# Patient Record
Sex: Female | Born: 1960
Health system: Southern US, Community
[De-identification: ages and names within clinical notes are randomized; demographics above are authoritative.]

## PROBLEM LIST (undated history)

## (undated) DIAGNOSIS — R7303 Prediabetes: Secondary | ICD-10-CM

## (undated) DIAGNOSIS — C189 Malignant neoplasm of colon, unspecified: Secondary | ICD-10-CM

## (undated) HISTORY — PX: TUBAL LIGATION: SHX77

## (undated) HISTORY — DX: Prediabetes: R73.03

## (undated) HISTORY — DX: Malignant neoplasm of colon, unspecified: C18.9

---

## 2012-11-01 DIAGNOSIS — R079 Chest pain, unspecified: Secondary | ICD-10-CM

## 2013-12-02 ENCOUNTER — Emergency Department (HOSPITAL_COMMUNITY): Payer: Self-pay

## 2013-12-02 ENCOUNTER — Emergency Department (HOSPITAL_COMMUNITY)
Admission: EM | Admit: 2013-12-02 | Discharge: 2013-12-02 | Disposition: A | Discharge status: Home / Self Care | Payer: Self-pay | Attending: Emergency Medicine | Admitting: Emergency Medicine

## 2013-12-02 ENCOUNTER — Encounter (HOSPITAL_COMMUNITY): Payer: Self-pay | Admitting: Emergency Medicine

## 2013-12-02 DIAGNOSIS — R21 Rash and other nonspecific skin eruption: Secondary | ICD-10-CM | POA: Insufficient documentation

## 2013-12-02 DIAGNOSIS — L03011 Cellulitis of right finger: Secondary | ICD-10-CM

## 2013-12-02 DIAGNOSIS — L03019 Cellulitis of unspecified finger: Principal | ICD-10-CM

## 2013-12-02 DIAGNOSIS — L02519 Cutaneous abscess of unspecified hand: Secondary | ICD-10-CM | POA: Insufficient documentation

## 2013-12-02 MED ORDER — HYDROCODONE-ACETAMINOPHEN 5-325 MG PO TABS: ORAL_TABLET | ORAL | Status: DC

## 2013-12-02 MED ORDER — IBUPROFEN 600 MG PO TABS: 600.0000 mg | ORAL_TABLET | Freq: Four times a day (QID) | ORAL | Status: DC | PRN

## 2013-12-02 MED ORDER — CEPHALEXIN 500 MG PO CAPS: 500.0000 mg | ORAL_CAPSULE | Freq: Four times a day (QID) | ORAL | Status: DC

## 2013-12-02 MED ORDER — OXYCODONE-ACETAMINOPHEN 5-325 MG PO TABS
1.0000 | ORAL_TABLET | Freq: Once | ORAL | Status: AC
  Administered 2013-12-02: 1 via ORAL
  Filled 2013-12-02: qty 1

## 2013-12-02 NOTE — ED Provider Notes (Signed)
CSN: [id number]     Arrival date & time 12/02/13  1321 History   First MD Initiated Contact with Patient 12/02/13 1401    This chart was scribed for Junius FinnerErin O'Malley PA-C, a non-physician practitioner working with No att. providers found by Lewanda RifeAlexandra Hurtado, ED Scribe. This patient was seen in room TR07C/TR07C and the patient's care was started at 4:29 PM      Chief Complaint  Patient presents with  . Finger Injury     (Consider location/radiation/quality/duration/timing/severity/associated sxs/prior Treatment) The history is provided by the patient. No language interpreter was used.   HPI Comments: Leah Hernandez is a 53 y.o. female who presents to the Emergency Department complaining of constant right thumb pain onset 4 days after attempting to remove her own acrylic nails. Reports she stabbed herself under right thumb nail while trying to remove nail. Describes pain as gradually worsening in severity and aching. Reports pain is exacerbated by touch. Reports "soaking" thumb with no relief of symptoms, but denies trying other alleviating factors. Denies associated fever, drainage. Denies hx of DM.     History reviewed. No pertinent past medical history. History reviewed. No pertinent past surgical history. No family history on file. History  Substance Use Topics  . Smoking status: Never Smoker   . Smokeless tobacco: Not on file  . Alcohol Use: No   OB History   Grav Para Term Preterm Abortions TAB SAB Ect Mult Living                 Review of Systems  Constitutional: Negative for fever and chills.  Gastrointestinal: Negative for nausea and vomiting.  Musculoskeletal: Positive for arthralgias, joint swelling and myalgias.       Right thumb  Skin: Positive for rash and wound.  All other systems reviewed and are negative.     Allergies  Review of patient's allergies indicates no known allergies.  Home Medications   Prior to Admission medications   Not on File   BP  127/91  Pulse 78  Temp(Src) 98.4 F (36.9 C) (Oral)  Resp 16  Wt 225 lb (102.059 kg)  SpO2 100% Physical Exam  Nursing note and vitals reviewed. Constitutional: She is oriented to person, place, and time. She appears well-developed and well-nourished.  HENT:  Head: Normocephalic and atraumatic.  Eyes: EOM are normal.  Neck: Normal range of motion.  Cardiovascular: Normal rate.   Pulmonary/Chest: Effort normal.  Musculoskeletal: She exhibits edema and tenderness.  Mild edema to right thumb with erythema. Mild circumferential tenderness.  Decreased flexion and extension due to pain, but increased flexion at PIP of right thumb with passive movement.  Cap refill <2seconds.   Neurological: She is alert and oriented to person, place, and time.  Sensation in tact to light and sharp touch of right thumb  Skin: Skin is warm and dry. There is erythema.  Psychiatric: She has a normal mood and affect. Her behavior is normal.    ED Course  Procedures (including critical care time) COORDINATION OF CARE:  Nursing notes reviewed. Vital signs reviewed. Initial pt interview and examination performed.   Filed Vitals:   12/02/13 1339 12/02/13 1619  BP: 145/93 127/91  Pulse: 89 78  Temp: 98.4 F (36.9 C)   TempSrc: Oral   Resp: 18 16  Weight: 225 lb (102.059 kg)   SpO2: 100% 100%    4:29 PM-Discussed work up plan with pt at bedside, which includes  Orders Placed This Encounter  Procedures  .  DG Finger Thumb Right    Standing Status: Standing     Number of Occurrences: 1     Standing Expiration Date:     Order Specific Question:  Reason for exam:    Answer:  FINGER INJURY  . Pt agrees with plan.   Treatment plan initiated: Medications  oxyCODONE-acetaminophen (PERCOCET/ROXICET) 5-325 MG per tablet 1 tablet (1 tablet Oral Given 12/02/13 1403)     Initial diagnostic testing ordered.       Labs Review Labs Reviewed - No data to display  Imaging Review Dg Finger Thumb  Right  12/02/2013   CLINICAL DATA:  Right thumb swelling, redness.  Possible infection.  EXAM: RIGHT THUMB 2+V  COMPARISON:  None.  FINDINGS: Small amount of gas within the soft tissues at the tip of the right thumb. No underlying bony abnormality. No fracture, subluxation or dislocation. No radiographic changes of osteomyelitis.  IMPRESSION: Small amount of gas within the tip of the right thumb. No underlying bony abnormality.   Electronically Signed   By: Charlett NoseKevin  Dover M.D.   On: 12/02/2013 16:04     EKG Interpretation None      MDM   Final diagnoses:  Cellulitis of right thumb    Patient complaining pain spine and redness of right thumb. Discussed patient with Dr. Fayrene FearingJames who also examined the patient. Will treat for cellulitis with Keflex. Return precautions provided. Pt verbalized understanding and agreement with tx plan.   I personally performed the services described in this documentation, which was scribed in my presence. The recorded information has been reviewed and is accurate.    Junius Finnerrin O'Malley, PA-C 12/02/13 1630

## 2013-12-02 NOTE — Discharge Instructions (Signed)
Cellulitis °Cellulitis is an infection of the skin and the tissue under the skin. The infected area is usually red and tender. This happens most often in the arms and lower legs. °HOME CARE  °· Take your antibiotic medicine as told. Finish the medicine even if you start to feel better. °· Keep the infected arm or leg raised (elevated). °· Put a warm cloth on the area up to 4 times per day. °· Only take medicines as told by your doctor. °· Keep all doctor visits as told. °GET HELP RIGHT AWAY IF:  °· You have a fever. °· You feel very sleepy. °· You throw up (vomit) or have watery poop (diarrhea). °· You feel sick and have muscle aches and pains. °· You see red streaks on the skin coming from the infected area. °· Your red area gets bigger or turns a dark color. °· Your bone or joint under the infected area is painful after the skin heals. °· Your infection comes back in the same area or different area. °· You have a puffy (swollen) bump in the infected area. °· You have new symptoms. °MAKE SURE YOU:  °· Understand these instructions. °· Will watch your condition. °· Will get help right away if you are not doing well or get worse. °Document Released: 01/03/2008 Document Revised: 01/16/2012 Document Reviewed: 10/02/2011 °[employer]® Patient Information ©2014 [employer]. ° °

## 2013-12-02 NOTE — ED Notes (Signed)
Pt returned from X-ray.  

## 2013-12-02 NOTE — ED Notes (Signed)
PA at bedside.

## 2013-12-02 NOTE — ED Notes (Signed)
Pt reports hang nail or cuticle got lodged under right thumb fingernail. Pain to nail bed, bruising noted to nail bed.

## 2013-12-09 NOTE — ED Provider Notes (Signed)
Medical screening examination/treatment/procedure(s) were performed by non-physician practitioner and as supervising physician I was immediately available for consultation/collaboration.   EKG Interpretation None        Dyani Babel, MD 12/09/13 1948 

## 2014-09-07 ENCOUNTER — Encounter (HOSPITAL_COMMUNITY): Payer: Self-pay

## 2014-09-07 ENCOUNTER — Inpatient Hospital Stay (HOSPITAL_COMMUNITY)
Admission: AD | Admit: 2014-09-07 | Discharge: 2014-09-07 | Disposition: A | Discharge status: Home / Self Care | Payer: Self-pay | Source: Ambulatory Visit | Attending: Obstetrics & Gynecology | Admitting: Obstetrics & Gynecology

## 2014-09-07 DIAGNOSIS — R109 Unspecified abdominal pain: Secondary | ICD-10-CM

## 2014-09-07 DIAGNOSIS — K5900 Constipation, unspecified: Secondary | ICD-10-CM

## 2014-09-07 LAB — CBC
HEMATOCRIT: 35.9 % — AB (ref 36.0–46.0)
HEMOGLOBIN: 12 g/dL (ref 12.0–15.0)
MCH: 31 pg (ref 26.0–34.0)
MCHC: 33.4 g/dL (ref 30.0–36.0)
MCV: 92.8 fL (ref 78.0–100.0)
PLATELETS: 181 10*3/uL (ref 150–400)
RBC: 3.87 MIL/uL (ref 3.87–5.11)
RDW: 12.9 % (ref 11.5–15.5)
WBC: 5.2 10*3/uL (ref 4.0–10.5)

## 2014-09-07 LAB — URINALYSIS, ROUTINE W REFLEX MICROSCOPIC
Bilirubin Urine: NEGATIVE
Glucose, UA: NEGATIVE mg/dL
Hgb urine dipstick: NEGATIVE
Ketones, ur: NEGATIVE mg/dL
NITRITE: NEGATIVE
Protein, ur: NEGATIVE mg/dL
SPECIFIC GRAVITY, URINE: 1.025 (ref 1.005–1.030)
Urobilinogen, UA: 0.2 mg/dL (ref 0.0–1.0)
pH: 5.5 (ref 5.0–8.0)

## 2014-09-07 LAB — URINE MICROSCOPIC-ADD ON

## 2014-09-07 LAB — WET PREP, GENITAL
CLUE CELLS WET PREP: NONE SEEN
TRICH WET PREP: NONE SEEN
YEAST WET PREP: NONE SEEN

## 2014-09-07 MED ORDER — KETOROLAC TROMETHAMINE 60 MG/2ML IM SOLN: 60.0000 mg | Freq: Once | INTRAMUSCULAR | Status: DC

## 2014-09-07 MED ORDER — POLYETHYLENE GLYCOL 3350 17 G PO PACK: 17.0000 g | PACK | Freq: Every day | ORAL | Status: DC

## 2014-09-07 NOTE — MAU Note (Signed)
RLQ pain for the past month, pt states she feels as if her abdomen is swollen.  Denies vag bleeding, small amount of discharge.

## 2014-09-07 NOTE — MAU Provider Note (Signed)
History     CSN: [id number]  Arrival date and time: 09/07/14 1549   First Provider Initiated Contact with Patient 09/07/14 1721      Chief Complaint  Patient presents with  . Abdominal Pain   HPI  OB History    No data available      Ms. Leah Hernandez is a 54 y.o. female No obstetric history on file. Who presents with abdominal pain that started 1 month ago.  She is postmenopausal; her last menstrual cycle was when she was in her 26's.   The pain is described as pressure. And the pressure is in her lower abdomen.  She has never had this pain before. She has not tried anything over the counter for the pain.  She has a PCP and she did not notify them of the pain.   Patient is married.  Denies history of uterine fibroids.   She suffers with constipation and does not taken any regular for this.     History reviewed. No pertinent past medical history.  Past Surgical History  Procedure Laterality Date  . Tubal ligation      History reviewed. No pertinent family history.  History  Substance Use Topics  . Smoking status: Never Smoker   . Smokeless tobacco: Not on file  . Alcohol Use: No    Allergies: No Known Allergies  Prescriptions prior to admission  Medication Sig Dispense Refill Last Dose  . cephALEXin (KEFLEX) 500 MG capsule Take 1 capsule (500 mg total) by mouth 4 (four) times daily. (Patient not taking: Reported on 09/07/2014) 40 capsule 0   . HYDROcodone-acetaminophen (NORCO/VICODIN) 5-325 MG per tablet Take 1-2 pills every 4-6 hours as needed for pain. (Patient not taking: Reported on 09/07/2014) 10 tablet 0 more than one month  . ibuprofen (ADVIL,MOTRIN) 600 MG tablet Take 1 tablet (600 mg total) by mouth every 6 (six) hours as needed. (Patient not taking: Reported on 09/07/2014) 30 tablet 0 more than one month   Results for orders placed or performed during the hospital encounter of 09/07/14 (from the past 48 hour(s))  Urinalysis, Routine w reflex  microscopic     Status: Abnormal   Collection Time: 09/07/14  4:15 PM  Result Value Ref Range   Color, Urine YELLOW YELLOW   APPearance CLEAR CLEAR   Specific Gravity, Urine 1.025 1.005 - 1.030   pH 5.5 5.0 - 8.0   Glucose, UA NEGATIVE NEGATIVE mg/dL   Hgb urine dipstick NEGATIVE NEGATIVE   Bilirubin Urine NEGATIVE NEGATIVE   Ketones, ur NEGATIVE NEGATIVE mg/dL   Protein, ur NEGATIVE NEGATIVE mg/dL   Urobilinogen, UA 0.2 0.0 - 1.0 mg/dL   Nitrite NEGATIVE NEGATIVE   Leukocytes, UA SMALL (A) NEGATIVE  Urine microscopic-add on     Status: Abnormal   Collection Time: 09/07/14  4:15 PM  Result Value Ref Range   Squamous Epithelial / LPF FEW (A) RARE   WBC, UA 3-6 <3 WBC/hpf   RBC / HPF 0-2 <3 RBC/hpf   Bacteria, UA FEW (A) RARE  CBC     Status: Abnormal   Collection Time: 09/07/14  5:40 PM  Result Value Ref Range   WBC 5.2 4.0 - 10.5 K/uL   RBC 3.87 3.87 - 5.11 MIL/uL   Hemoglobin 12.0 12.0 - 15.0 g/dL   HCT 40.9 (L) 81.1 - 91.4 %   MCV 92.8 78.0 - 100.0 fL   MCH 31.0 26.0 - 34.0 pg   MCHC 33.4 30.0 - 36.0 g/dL  RDW 12.9 11.5 - 15.5 %   Platelets 181 150 - 400 K/uL  Wet prep, genital     Status: Abnormal   Collection Time: 09/07/14  6:01 PM  Result Value Ref Range   Yeast Wet Prep HPF POC NONE SEEN NONE SEEN   Trich, Wet Prep NONE SEEN NONE SEEN   Clue Cells Wet Prep HPF POC NONE SEEN NONE SEEN   WBC, Wet Prep HPF POC MODERATE (A) NONE SEEN    Comment: MANY BACTERIA SEEN    Review of Systems  Constitutional: Negative for fever and chills.  Eyes: Negative for blurred vision.  Cardiovascular: Negative for chest pain.  Gastrointestinal: Positive for nausea, abdominal pain and constipation (Last BM was this morning. The stools are hard. ). Negative for heartburn, vomiting and diarrhea.  Genitourinary: Negative for dysuria, urgency, frequency and hematuria.       + clear, vaginal discharge.    Physical Exam   Blood pressure 151/83, pulse 97, temperature 98.2 F (36.8  C), temperature source Oral, resp. rate 18, height 5\' 7"  (1.702 m), weight 99.428 kg (219 lb 3.2 oz).  Physical Exam  Constitutional: She is oriented to person, place, and time. She appears well-developed and well-nourished. No distress.  HENT:  Head: Normocephalic.  Neck: Neck supple.  Cardiovascular: Normal rate and normal heart sounds.   Respiratory: Effort normal.  GI: Soft. She exhibits no distension. There is no tenderness. There is no rebound and no guarding.  Genitourinary:  Speculum exam: Vagina - Small amount of creamy discharge, no odor Cervix - No contact bleeding Bimanual exam: Cervix closed Large amount of stool palpated in the colon.  Uterus non tender, normal size Adnexa non tender, no masses bilaterally GC/Chlam, wet prep done Chaperone present for exam.   Neurological: She is alert and oriented to person, place, and time.  Skin: Skin is warm. She is not diaphoretic.  Psychiatric: Her behavior is normal.    MAU Course  Procedures  None  MDM CBC UA Wet prep GC HIV   Assessment and Plan   A: Abdominal pain Constipation   P: Discharge home in stable condition RX: Miralax  Over the counter stool softer as directed on the bottle  Increase water intake. If symptoms have not improved after regular BM, call PCP.   Iona HansenJennifer Irene Rasch, NP 09/07/2014 7:56 PM

## 2014-09-08 LAB — GC/CHLAMYDIA PROBE AMP (~~LOC~~) NOT AT ARMC
Chlamydia: NEGATIVE
Neisseria Gonorrhea: NEGATIVE

## 2014-09-08 LAB — HIV ANTIBODY (ROUTINE TESTING W REFLEX): HIV Screen 4th Generation wRfx: NONREACTIVE

## 2015-02-27 ENCOUNTER — Emergency Department (HOSPITAL_COMMUNITY): Payer: Self-pay

## 2015-02-27 ENCOUNTER — Emergency Department (HOSPITAL_COMMUNITY)
Admission: EM | Admit: 2015-02-27 | Discharge: 2015-02-27 | Disposition: A | Discharge status: Home / Self Care | Payer: Self-pay | Attending: Emergency Medicine | Admitting: Emergency Medicine

## 2015-02-27 ENCOUNTER — Encounter (HOSPITAL_COMMUNITY): Payer: Self-pay | Admitting: Emergency Medicine

## 2015-02-27 DIAGNOSIS — Y9289 Other specified places as the place of occurrence of the external cause: Secondary | ICD-10-CM | POA: Insufficient documentation

## 2015-02-27 DIAGNOSIS — Z79899 Other long term (current) drug therapy: Secondary | ICD-10-CM | POA: Insufficient documentation

## 2015-02-27 DIAGNOSIS — W1839XA Other fall on same level, initial encounter: Secondary | ICD-10-CM | POA: Insufficient documentation

## 2015-02-27 DIAGNOSIS — M25512 Pain in left shoulder: Secondary | ICD-10-CM

## 2015-02-27 DIAGNOSIS — Y9389 Activity, other specified: Secondary | ICD-10-CM | POA: Insufficient documentation

## 2015-02-27 DIAGNOSIS — Y990 Civilian activity done for income or pay: Secondary | ICD-10-CM | POA: Insufficient documentation

## 2015-02-27 DIAGNOSIS — S4992XA Unspecified injury of left shoulder and upper arm, initial encounter: Secondary | ICD-10-CM | POA: Insufficient documentation

## 2015-02-27 MED ORDER — IBUPROFEN 400 MG PO TABS: 400.0000 mg | ORAL_TABLET | Freq: Four times a day (QID) | ORAL | Status: DC | PRN

## 2015-02-27 NOTE — ED Provider Notes (Signed)
History  This chart was scribed for non-physician practitioner, Eyvonne Mechanic, PA-C,working with Elwin Mocha, MD, by Karle Plumber, ED Scribe. This patient was seen in room TR06C/TR06C and the patient's care was started at 10:40 AM.  Chief Complaint  Patient presents with  . Arm Injury   The history is provided by the patient and medical records. No language interpreter was used.    HPI Comments:  Leah Hernandez is a 54 y.o. 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 She states the pain is severe and radiates from her left shoulder down the arm. She has been taking an OTC pain medication in which she cannot remember the name of but thought it was Percocet. Raising the arm makes the pain worse. She denies alleviating factors. She denies numbness, tingling or weakness of the LUE, bruising, wounds, swelling, fever, chills, nausea or vomiting.   History reviewed. No pertinent past medical history. Past Surgical History  Procedure Laterality Date  . Tubal ligation     No family history on file. History  Substance Use Topics  . Smoking status: Never Smoker   . Smokeless tobacco: Not on file  . Alcohol Use: No   OB History    No data available     Review of Systems  All other systems reviewed and are negative.   Allergies  Review of patient's allergies indicates no known allergies.  Home Medications   Prior to Admission medications   Medication Sig Start Date End Date Taking? Authorizing Provider  ibuprofen (ADVIL,MOTRIN) 400 MG tablet Take 1 tablet (400 mg total) by mouth every 6 (six) hours as needed. 02/27/15   Eyvonne Mechanic, PA-C  polyethylene glycol (MIRALAX / GLYCOLAX) packet Take 17 g by mouth daily. 09/07/14   Duane Lope, NP   Triage Vitals: BP 142/88 mmHg  Pulse 87  Temp(Src) 98 F (36.7 C) (Oral)  Resp 17  Ht   (1.702 m)  Wt 226 lb 2 oz (102.57 kg)  BMI 35.41 kg/m2  SpO2 100% Physical Exam  Constitutional: She is oriented to person, place, and time. She appears well-developed and well-nourished.  HENT:  Head: Normocephalic and atraumatic.  Eyes: EOM are normal.  Neck: Normal range of motion.  Cardiovascular: Normal rate.   Pulmonary/Chest: Effort normal.  Musculoskeletal: Normal range of motion.  Shoulders equal bilaterally. No signs of trauma or deformity. Tenderness to palpation to left shoulder diffusely. Reduced left shoulder flexion to 90 degrees due to pain. Remainder of shoulder ROM intact. Full ROM. Pain with active and passive forward flexion. Minimal pain with abduction. Negative lift-off. Pain with empty can. Sensation intact in the extremity. Radial pulse 2+. Cap refill less than 3 seconds. Grip strength 5/5.  Neurological: She is alert and oriented to person, place, and time.  Skin: Skin is warm and dry.  Psychiatric: She has a normal mood and affect. Her behavior is normal.  Nursing note and vitals reviewed.   ED Course  Procedures (including critical care time) DIAGNOSTIC STUDIES: Oxygen Saturation is 100% on RA, normal by my interpretation.   COORDINATION OF CARE: 10:43 AM- Will X-Ray left shoulder. Pt verbalizes understanding and agrees to plan.  Medications - No data to display  Labs Review Labs Reviewed - No data to display  Imaging Review Dg Shoulder Left  02/27/2015   CLINICAL DATA:  Fall on left shoulder  1 month ago. Worsening pain over that time period.  EXAM: LEFT SHOULDER - 2+ VIEW  COMPARISON:  None.  FINDINGS: There is no evidence of fracture or dislocation. There is no evidence of arthropathy or other focal bone abnormality. Soft tissues are unremarkable.  IMPRESSION: Negative.   Electronically Signed   By: Charlett Nose M.D.   On: 02/27/2015 11:26     EKG Interpretation None      MDM   Final diagnoses:  Left shoulder pain     Labs:  Imaging: DG  shoulder left no significant findings  Consults:  Therapeutics:  Discharge Meds:   Assessment/Plan: Patient presents with a injury to her left shoulder. This fall was one month ago, and has not improved. No findings on plain radiograph, likely rotator cuff. Patient has full sensation and strength of the distal extremity, reduced range of motion due to pain. She'll be instructed to follow-up with orthopedic specialist for further evaluation and management. Patient verbalizes understanding and agreement for today's plan and had no further questions or concerns at time of discharge. Patient instructed use Tylenol or ibuprofen as needed for the pain.  I personally performed the services described in this documentation, which was scribed in my presence. The recorded information has been reviewed and is accurate.    Eyvonne Mechanic, PA-C 02/27/15 1648  Elwin Mocha, MD 02/28/15 [phone]

## 2015-02-27 NOTE — Discharge Instructions (Signed)
Please follow-up with orthopedic specialist for further evaluation and management. Please use ibuprofen as needed for pain.

## 2015-02-27 NOTE — ED Notes (Signed)
Pt. Stated, i fell about  Month ago and my left arm is still hurting especially at night.

## 2015-06-08 ENCOUNTER — Encounter (HOSPITAL_COMMUNITY): Payer: Self-pay | Admitting: *Deleted

## 2015-06-08 ENCOUNTER — Emergency Department (HOSPITAL_COMMUNITY)
Admission: EM | Admit: 2015-06-08 | Discharge: 2015-06-08 | Disposition: A | Discharge status: Home / Self Care | Payer: Self-pay | Attending: Emergency Medicine | Admitting: Emergency Medicine

## 2015-06-08 DIAGNOSIS — K0889 Other specified disorders of teeth and supporting structures: Secondary | ICD-10-CM | POA: Insufficient documentation

## 2015-06-08 DIAGNOSIS — Z79899 Other long term (current) drug therapy: Secondary | ICD-10-CM | POA: Insufficient documentation

## 2015-06-08 DIAGNOSIS — R220 Localized swelling, mass and lump, head: Secondary | ICD-10-CM | POA: Insufficient documentation

## 2015-06-08 MED ORDER — BUPIVACAINE-EPINEPHRINE (PF) 0.5% -1:200000 IJ SOLN
1.8000 mL | Freq: Once | INTRAMUSCULAR | Status: AC
  Administered 2015-06-08: 1.8 mL

## 2015-06-08 MED ORDER — HYDROCODONE-ACETAMINOPHEN 5-325 MG PO TABS: ORAL_TABLET | ORAL | Status: DC

## 2015-06-08 MED ORDER — AMOXICILLIN 500 MG PO CAPS
500.0000 mg | ORAL_CAPSULE | Freq: Once | ORAL | Status: AC
  Administered 2015-06-08: 500 mg via ORAL
  Filled 2015-06-08: qty 1

## 2015-06-08 MED ORDER — AMOXICILLIN 500 MG PO CAPS: 500.0000 mg | ORAL_CAPSULE | Freq: Three times a day (TID) | ORAL | Status: DC

## 2015-06-08 NOTE — Discharge Instructions (Signed)
Take vicodin for breakthrough pain, do not drink alcohol, drive, care for children or do other critical tasks while taking vicodin. ° °Return to the emergency room for fever, change in vision, redness to the face that rapidly spreads towards the eye, nausea or vomiting, difficulty swallowing or shortness of breath. °  °Apply warm compresses to jaw throughout the day.  ° °Take your antibiotics as directed and to the end of the course. DO NOT drink alcohol when taking metronidazole, it will make you very sick!  ° °Followup with a dentist is very important for ongoing evaluation and management of recurrent dental pain. Return to emergency department for emergent changing or worsening symptoms." ° °Low-cost dental clinic: °**[facility]  [facility]  at [phone]**  ° °You may also call [phone] ° °Dental Assistance °If the dentist on-call cannot see you, please use the resources below: ° ° °Patients with Medicaid: [facility] °[address], [phone] °[address], [phone] ° °If unable to pay, or uninsured, contact [facility] ([phone]) or [facility] ([phone] in [place], [phone] in [place]) to become qualified for the adult dental clinic ° °Other Low-Cost Community Dental Services: °[facility]- [address], [place], [place], [address] °   [phone], Ext. [phone] °   2nd and 4th Thursday of the month at 6:30am °   10 clients each day by appointment, can sometimes see walk-in     patients if someone does not show for an appointment °[facility]- [address], [place], [place], [address] °   [phone] °[facility]- [address], [place], [place], [address] °   [phone] ° °[facility]- [phone] °[facility]- [phone] °[facility]- [phone] ° °

## 2015-06-08 NOTE — ED Notes (Signed)
The pt is c/o lt face pain and swelliong since Saturday.  No known injury lmp none

## 2015-06-08 NOTE — ED Provider Notes (Signed)
CSN: [id number]     Arrival date & time 06/08/15  1725 History  By signing my name below, I, Leah Hernandez, attest that this documentation has been prepared under the direction and in the presence of [employer] Corporation[employer]ole Nykeria Mealing, PA-C. Electronically Signed: Lyndel SafeKaitlyn Hernandez, ED Scribe. 06/08/2015. 5:54 PM.   Chief Complaint  Patient presents with  . Facial Pain   The history is provided by the patient. No language interpreter was used.   HPI Comments: Leah Hernandez is a 54 y.o. female, with no pertinent PMhx, who presents to the Emergency Department complaining of sudden onset, constant, 8/10 pain and swelling to lower, left mandible X 1 day. She associates posterior left lower dental pain. Pt has been taking Advil that has provided temporary relief. She is not followed by a dentist. NKDA   History reviewed. No pertinent past medical history. Past Surgical History  Procedure Laterality Date  . Tubal ligation     No family history on file. Social History  Substance Use Topics  . Smoking status: Never Smoker   . Smokeless tobacco: None  . Alcohol Use: No   OB History    No data available     Review of Systems A complete 10 system review of systems was obtained and is otherwise negative except at noted in the HPI and PMH.  Allergies  Review of patient's allergies indicates no known allergies.  Home Medications   Prior to Admission medications   Medication Sig Start Date End Date Taking? Authorizing Provider  ibuprofen (ADVIL,MOTRIN) 400 MG tablet Take 1 tablet (400 mg total) by mouth every 6 (six) hours as needed. 02/27/15   Eyvonne MechanicJeffrey Hedges, PA-C  polyethylene glycol (MIRALAX / GLYCOLAX) packet Take 17 g by mouth daily. 09/07/14   Harolyn RutherfordJennifer I Rasch, NP   BP 151/89 mmHg  Pulse 97  Temp(Src) 99.3 F (37.4 C) (Oral)  Resp 18  Ht 5\' 8"  (1.727 m)  Wt 226 lb (102.513 kg)  BMI 34.37 kg/m2  SpO2 98% Physical Exam  Constitutional: She is oriented to person, place, and time. She appears  well-developed and well-nourished. No distress.  HENT:  Head: Normocephalic.  Mouth/Throat: Uvula is midline and oropharynx is clear and moist. No trismus in the jaw. No uvula swelling. No oropharyngeal exudate, posterior oropharyngeal edema, posterior oropharyngeal erythema or tonsillar abscesses.  Trace left mandibular edema, no overlying skin changes  Generally poor dentition, no gingival swelling, erythema or tenderness to palpation. Patient is handling their secretions. There is no tenderness to palpation or firmness underneath tongue bilaterally. No trismus.    Eyes: Conjunctivae and EOM are normal.  Neck: Normal range of motion. Neck supple.  Cardiovascular: Normal rate.   Pulmonary/Chest: Effort normal. No stridor. No respiratory distress.  Musculoskeletal: Normal range of motion.  Lymphadenopathy:    She has no cervical adenopathy.  Neurological: She is alert and oriented to person, place, and time. Coordination normal.  Skin: Skin is warm.  Psychiatric: She has a normal mood and affect. Her behavior is normal.  Nursing note and vitals reviewed.   ED Course  .Nerve Block Date/Time: 06/09/2015 12:12 AM Performed by: Wynetta EmeryPISCIOTTA, Disney Ruggiero Authorized by: Wynetta EmeryPISCIOTTA, Kinnley Paulson Consent: Verbal consent obtained. Consent given by: patient Indications: pain relief Body area: face/mouth Nerve: inferior alveolar Laterality: left Patient sedated: no Patient position: sitting Needle gauge: 27 G Location technique: anatomical landmarks Anesthetic total: 1.8 ml Outcome: pain improved Patient tolerance: Patient tolerated the procedure well with no immediate complications    DIAGNOSTIC STUDIES: Oxygen Saturation  is 98% on RA, normal by my interpretation.    COORDINATION OF CARE: 5:53 PM Discussed treatment plan with pt at bedside and pt agreed to plan. Will perform dental block with Marcaine 0.5% w/ epinephrine; pt tolerated procedure well. Amoxicillin ordered and course prescribed. Pain  medication also prescribed. Will give resource list for dental care.   MDM   Final diagnoses:  Pain, dental    Filed Vitals:   06/08/15 1731  BP: 151/89  Pulse: 97  Temp: 99.3 F (37.4 C)  TempSrc: Oral  Resp: 18  Height:  (1.727 m)  Weight: 226 lb (102.513 kg)  SpO2: 98%    Medications  amoxicillin (AMOXIL) capsule 500 mg (500 mg Oral Given 06/08/15 1834)  bupivacaine-epinephrine (MARCAINE W/ EPI) 0.5% -1:200000 injection 1.8 mL (1.8 mLs Infiltration Given 06/08/15 1834)    Leah Hernandez is 55 y.o. female presenting with dental pain and facial swelling, no fluctuant abscess. No signs of deep tissue infection.  Evaluation does not show pathology that would require ongoing emergent intervention or inpatient treatment. Pt is hemodynamically stable and mentating appropriately. Discussed findings and plan with patient/guardian, who agrees with care plan. All questions answered. Return precautions discussed and outpatient follow up given.   Discharge Medication List as of 06/08/2015  5:59 PM    START taking these medications   Details  amoxicillin (AMOXIL) 500 MG capsule Take 1 capsule (500 mg total) by mouth 3 (three) times daily., Starting 06/08/2015, Until Discontinued, Print    HYDROcodone-acetaminophen (NORCO/VICODIN) 5-325 MG tablet Take 1-2 tablets by mouth every 6 hours as needed for pain and/or cough., Print        I personally performed the services described in this documentation, which was scribed in my presence. The recorded information has been reviewed and is accurate.     Wynetta Emery, PA-C 06/09/15 0013  Linwood Dibbles, MD 06/09/15 1054

## 2015-06-22 ENCOUNTER — Encounter (HOSPITAL_COMMUNITY): Payer: Self-pay | Admitting: Emergency Medicine

## 2015-06-22 ENCOUNTER — Emergency Department (HOSPITAL_COMMUNITY)
Admission: EM | Admit: 2015-06-22 | Discharge: 2015-06-22 | Disposition: A | Discharge status: Home / Self Care | Payer: Self-pay | Attending: Emergency Medicine | Admitting: Emergency Medicine

## 2015-06-22 DIAGNOSIS — T360X5A Adverse effect of penicillins, initial encounter: Secondary | ICD-10-CM | POA: Insufficient documentation

## 2015-06-22 DIAGNOSIS — L270 Generalized skin eruption due to drugs and medicaments taken internally: Secondary | ICD-10-CM | POA: Insufficient documentation

## 2015-06-22 DIAGNOSIS — Z79899 Other long term (current) drug therapy: Secondary | ICD-10-CM | POA: Insufficient documentation

## 2015-06-22 DIAGNOSIS — Z792 Long term (current) use of antibiotics: Secondary | ICD-10-CM | POA: Insufficient documentation

## 2015-06-22 MED ORDER — HYDROXYZINE HCL 25 MG PO TABS: 25.0000 mg | ORAL_TABLET | Freq: Four times a day (QID) | ORAL | Status: DC

## 2015-06-22 MED ORDER — LORATADINE 10 MG PO TABS
10.0000 mg | ORAL_TABLET | Freq: Every day | ORAL | Status: DC
Start: 2015-06-22 — End: 2017-03-27

## 2015-06-22 MED ORDER — LORATADINE 10 MG PO TABS
10.0000 mg | ORAL_TABLET | Freq: Once | ORAL | Status: AC
  Administered 2015-06-22: 10 mg via ORAL
  Filled 2015-06-22: qty 1

## 2015-06-22 MED ORDER — HYDROXYZINE HCL 10 MG PO TABS
10.0000 mg | ORAL_TABLET | Freq: Once | ORAL | Status: AC
  Administered 2015-06-22: 10 mg via ORAL
  Filled 2015-06-22 (×2): qty 1

## 2015-06-22 NOTE — Discharge Instructions (Signed)
Drug Rash °A drug rash is a change in the color or texture of the skin that is caused by a drug. It can develop minutes, hours, or days after the person takes the drug. °CAUSES °This condition is usually caused by a drug allergy. It can also be caused by exposure to sunlight after taking a drug that makes the skin sensitive to light. Drugs that commonly cause rashes include: °· Penicillin. °· Antibiotic medicines. °· Medicines that treat seizures. °· Medicines that treat cancer (chemotherapy). °· Aspirin and other nonsteroidal anti-inflammatory drugs (NSAIDs). °· Injectable dyes that contain iodine. °· Insulin. °SYMPTOMS °Symptoms of this condition include: °· Redness. °· Tiny bumps. °· Peeling. °· Itching. °· Itchy welts (hives). °· Swelling. °The rash may appear on a small area of skin or all over the body. °DIAGNOSIS °To diagnose the condition, your health care provider will do a physical exam. He or she may also order tests to find out which drug caused the rash. Tests to find the cause of a rash include: °· Skin tests. °· Blood tests. °· Drug challenge. For this test, you stop taking all of the drugs that you do not need to take, and then you start taking them again by adding back one of the drugs at a time. °TREATMENT °A drug rash may be treated with medicines, including: °· Antihistamines. These may be given to relieve itching. °· An NSAID. This may be given to reduce swelling and treat pain. °· A steroid drug. This may be given to reduce swelling. °The rash usually goes away when the person stops taking the drug that caused it. °HOME CARE INSTRUCTIONS °· Take medicines only as directed by your health care provider. °· Let all of your health care providers know about any drug reactions you have had in the past. °· If you have hives, take a cool shower or use a cool compress to relieve itchiness. °SEEK MEDICAL CARE IF: °· You have a fever. °· Your rash is not going away. °· Your rash gets worse. °· Your rash  comes back. °· You have wheezing or coughing. °SEEK IMMEDIATE MEDICAL CARE IF: °· You start to have breathing problems. °· You start to have shortness of breath. °· You face or throat starts to swell. °· You have severe weakness with dizziness or fainting. °· You have chest pain. °  °This information is not intended to replace advice given to you by your health care provider. Make sure you discuss any questions you have with your health care provider. °  °Document Released: 08/24/2004 Document Revised: 08/07/2014 Document Reviewed: 05/13/2014 °Elsevier Interactive Patient Education ©2016 Elsevier Inc. ° °

## 2015-06-22 NOTE — ED Notes (Addendum)
RASH X 1 WEEK. TOOK AN ANTIBIOTIC 1 WEEK PRIOR TO THAT FOR A DENTAL INFECTION. ITCHING RASH PREDOMINATELY TO CHEST AND BACK. DENIES ANY OTHER SX. PT ALSO REPORTS, SHE HAS ITCHING IN PALMS OF HANDS AND SOLE'S OF FEET.

## 2015-06-22 NOTE — ED Notes (Signed)
Called and spoke with Dennie BiblePat, pharmacy, and she advised that they had sent.   Nothing received on this end.  She stated she would send another.

## 2015-06-22 NOTE — ED Notes (Signed)
Called pharmacy to send Atarax.

## 2015-06-22 NOTE — ED Provider Notes (Signed)
CSN: [id number]     Arrival date & time 06/22/15  1050 History  By signing my name below, I, Ronney Lion, attest that this documentation has been prepared under the direction and in the presence of Marlon Pel, PA-C. Electronically Signed: Ronney Lion, ED Scribe. 06/22/2015. 12:08 PM.     Chief Complaint  Patient presents with  . Rash   The history is provided by the patient. No language interpreter was used.    HPI Comments: Leah Hernandez is a 54 y.o. female who presents to the Emergency Department complaining of a gradual-onset, constant, gradually worsening, pruritic, generalized rash, predominately to her chest and back, that began 2 days ago. She also reports itching in the palms of her hand and soles of her feet. She states she started taking amoxicillin 6 days ago and stopped taking the amoxicillin yesterday. Patient states she had taken amoxicillin in the past, but she has never had any reaction to it in the past.  Has been using hydrocortisone cream for itching control. She notes she has sensitive skin. She denies lip swelling, mouth swelling, or difficulty breathing.  No systemic sypmtoms  History reviewed. No pertinent past medical history. Past Surgical History  Procedure Laterality Date  . Tubal ligation     No family history on file. Social History  Substance Use Topics  . Smoking status: Never Smoker   . Smokeless tobacco: None  . Alcohol Use: No   OB History    No data available     Review of Systems  A complete 10 system review of systems was obtained and all systems are negative except as noted in the HPI and PMH.     Allergies  Review of patient's allergies indicates no known allergies.  Home Medications   Prior to Admission medications   Medication Sig Start Date End Date Taking? Authorizing Provider  amoxicillin (AMOXIL) 500 MG capsule Take 1 capsule (500 mg total) by mouth 3 (three) times daily. 06/08/15   Nicole Pisciotta, PA-C   HYDROcodone-acetaminophen (NORCO/VICODIN) 5-325 MG tablet Take 1-2 tablets by mouth every 6 hours as needed for pain and/or cough. 06/08/15   Nicole Pisciotta, PA-C  hydrOXYzine (ATARAX/VISTARIL) 25 MG tablet Take 1 tablet (25 mg total) by mouth every 6 (six) hours. 06/22/15   Daimian Sudberry Neva Seat, PA-C  ibuprofen (ADVIL,MOTRIN) 400 MG tablet Take 1 tablet (400 mg total) by mouth every 6 (six) hours as needed. 02/27/15   Eyvonne Mechanic, PA-C  loratadine (CLARITIN) 10 MG tablet Take 1 tablet (10 mg total) by mouth daily. 06/22/15   Latashia Koch Neva Seat, PA-C  polyethylene glycol (MIRALAX / GLYCOLAX) packet Take 17 g by mouth daily. 09/07/14   Harolyn Rutherford Rasch, NP   BP 138/88 mmHg  Pulse 85  Temp(Src) 98.1 F (36.7 C) (Oral)  Resp 18  SpO2 98% Physical Exam  Constitutional: She is oriented to person, place, and time. She appears well-developed and well-nourished. No distress.  HENT:  Head: Normocephalic and atraumatic.  Mouth/Throat: Uvula is midline, oropharynx is clear and moist and mucous membranes are normal.  Eyes: Conjunctivae and EOM are normal.  Neck: Neck supple. No tracheal deviation present.  Cardiovascular: Normal rate.   Pulmonary/Chest: Effort normal. No respiratory distress.  Musculoskeletal: Normal range of motion.  Neurological: She is alert and oriented to person, place, and time.  Skin: Skin is warm and dry.  Erythematous macules that are diffuse and symmetric in distribution.   Psychiatric: She has a normal mood and affect. Her behavior is  normal.  Nursing note and vitals reviewed.   ED Course  Procedures (including critical care time)  DIAGNOSTIC STUDIES: Oxygen Saturation is 98% on RA, normal by my interpretation.    COORDINATION OF CARE: 12:04 PM - Discussed treatment plan with pt at bedside which includes Rx hydroxyzine. Pt cautioned about sedating side effects of medication. Strict return precautions given, and pt instructed to return if she develops wheezing, lip  swelling, or any other new, worsening, or concerning symptoms. Pt verbalized understanding and agreed to plan.   MDM   Final diagnoses:  Drug exanthem   NO systemic symptoms. Pt well appearing with no angioedema or respiratory involvement.  Given Atarax and Claritin. To continue using hydrocortisone cream at home for itching. She has already completed her abx.  Medications  hydrOXYzine (ATARAX/VISTARIL) tablet 10 mg (not administered)  loratadine (CLARITIN) tablet 10 mg (not administered)    54 y.o.Leah Hernandez's medical screening exam was performed and I feel the patient has had an appropriate workup for their chief complaint at this time and likelihood of emergent condition existing is low. They have been counseled on decision, discharge, follow up and which symptoms necessitate immediate return to the emergency department. They or their family verbally stated understanding and agreement with plan and discharged in stable condition.   Vital signs are stable at discharge. Filed Vitals:   06/22/15 1058 06/22/15 1210  BP: 146/76 138/88  Pulse: 91 85  Temp: 98 F (36.7 C) 98.1 F (36.7 C)  Resp: 18 18    I personally performed the services described in this documentation, which was scribed in my presence. The recorded information has been reviewed and is accurate.     Marlon Peliffany Chancy Smigiel, PA-C 06/22/15 1216  Melene Planan Floyd, DO 06/22/15 [phone]

## 2015-10-10 ENCOUNTER — Emergency Department (HOSPITAL_COMMUNITY)
Admission: EM | Admit: 2015-10-10 | Discharge: 2015-10-10 | Disposition: A | Discharge status: Home / Self Care | Payer: Self-pay | Attending: Emergency Medicine | Admitting: Emergency Medicine

## 2015-10-10 ENCOUNTER — Encounter (HOSPITAL_COMMUNITY): Payer: Self-pay | Admitting: *Deleted

## 2015-10-10 DIAGNOSIS — Z792 Long term (current) use of antibiotics: Secondary | ICD-10-CM | POA: Insufficient documentation

## 2015-10-10 DIAGNOSIS — R509 Fever, unspecified: Secondary | ICD-10-CM | POA: Insufficient documentation

## 2015-10-10 DIAGNOSIS — K0889 Other specified disorders of teeth and supporting structures: Secondary | ICD-10-CM | POA: Insufficient documentation

## 2015-10-10 DIAGNOSIS — Z79899 Other long term (current) drug therapy: Secondary | ICD-10-CM | POA: Insufficient documentation

## 2015-10-10 MED ORDER — HYDROCODONE-ACETAMINOPHEN 5-325 MG PO TABS
1.0000 | ORAL_TABLET | Freq: Once | ORAL | Status: AC
  Administered 2015-10-10: 1 via ORAL
  Filled 2015-10-10: qty 1

## 2015-10-10 MED ORDER — AMOXICILLIN 500 MG PO CAPS: 500.0000 mg | ORAL_CAPSULE | Freq: Three times a day (TID) | ORAL | Status: DC

## 2015-10-10 NOTE — ED Provider Notes (Signed)
CSN: [id number]     Arrival date & time 10/10/15  1015 History  By signing my name below, I, Soijett Blue, attest that this documentation has been prepared under the direction and in the presence of Jaynie Crumbleatyana Paislei Dorval, [employer]PA-C Electronically Signed: Soijett Blue, ED Scribe. 10/10/2015. 10:52 AM.   Chief Complaint  Patient presents with  . Dental Pain      The history is provided by the patient. No language interpreter was used.    Leah Hernandez is a 55 y.o. female who presents to the Emergency Department complaining of constant left lower dental pain onset yesterday. She notes that she is supposed to have the tooth pulled, but she has been putting it off lately. She notes that her left lower dental pain is worsened with temperature change and eating. Pt notes that she does have a dentist. She states that she is having associated symptoms of subjective fever and left ear pain. She states that she has tried advil with no relief for her symptoms. She denies facial swelling, trouble swallowing, and any other symptoms.    Per pt chart review: Pt was seen in the ED on 06/08/2015 for left lower dental pain. Pt had no imaging or labs completed at the time. Pt was given a dental block while in the ED for treatment. Pt was Rx amoxil and norco for their symptoms and given a dental resource list.  History reviewed. No pertinent past medical history. Past Surgical History  Procedure Laterality Date  . Tubal ligation     History reviewed. No pertinent family history. Social History  Substance Use Topics  . Smoking status: Never Smoker   . Smokeless tobacco: None  . Alcohol Use: No   OB History    No data available     Review of Systems  Constitutional: Positive for fever (subjective). Negative for chills.  HENT: Positive for dental problem and ear pain. Negative for facial swelling and trouble swallowing.       Allergies  Review of patient's allergies indicates no known allergies.  Home  Medications   Prior to Admission medications   Medication Sig Start Date End Date Taking? Authorizing Provider  amoxicillin (AMOXIL) 500 MG capsule Take 1 capsule (500 mg total) by mouth 3 (three) times daily. 06/08/15   Nicole Pisciotta, PA-C  HYDROcodone-acetaminophen (NORCO/VICODIN) 5-325 MG tablet Take 1-2 tablets by mouth every 6 hours as needed for pain and/or cough. 06/08/15   Nicole Pisciotta, PA-C  hydrOXYzine (ATARAX/VISTARIL) 25 MG tablet Take 1 tablet (25 mg total) by mouth every 6 (six) hours. 06/22/15   Tiffany Neva SeatGreene, PA-C  ibuprofen (ADVIL,MOTRIN) 400 MG tablet Take 1 tablet (400 mg total) by mouth every 6 (six) hours as needed. 02/27/15   Eyvonne MechanicJeffrey Hedges, PA-C  loratadine (CLARITIN) 10 MG tablet Take 1 tablet (10 mg total) by mouth daily. 06/22/15   Tiffany Neva SeatGreene, PA-C  polyethylene glycol (MIRALAX / GLYCOLAX) packet Take 17 g by mouth daily. 09/07/14   Harolyn RutherfordJennifer I Rasch, NP   BP 144/89 mmHg  Pulse 83  Temp(Src) 98.3 F (36.8 C) (Oral)  Resp 16  SpO2 100% Physical Exam  Constitutional: She is oriented to person, place, and time. She appears well-developed and well-nourished. No distress.  HENT:  Head: Normocephalic and atraumatic.  Tender to palpation left lower second and third molars. No surrounding gum swelling or evidence of an abscess. No facial swelling. Swelling under the tongue. No trismus.  Eyes: EOM are normal.  Neck: Neck supple.  Cardiovascular:  Normal rate.   Pulmonary/Chest: Effort normal. No respiratory distress.  Musculoskeletal: Normal range of motion.  Lymphadenopathy:    She has no cervical adenopathy.  Neurological: She is alert and oriented to person, place, and time.  Skin: Skin is warm and dry.  Psychiatric: She has a normal mood and affect. Her behavior is normal.  Nursing note and vitals reviewed.   ED Course  Procedures (including critical care time) DIAGNOSTIC STUDIES: Oxygen Saturation is 100% on RA, nl by my interpretation.     COORDINATION OF CARE: 10:52 AM Discussed treatment plan with pt at bedside which includes abx Rx, alternate tylenol and motrin, warm compresses, referral and follow up with dentist, pain medications, and pt agreed to plan.    Labs Review Labs Reviewed - No data to display  Imaging Review No results found.    EKG Interpretation None      MDM   Final diagnoses:  Pain, dental   Patient with ongoing dental pain, has been seen here before for the same. 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 Patient is concerned that her tooth might be getting infected. I will start her on amoxicillin. I will have patient follow-up with a dentist. At this time no evidence of ludwigs angina or any other emergent condition. Return precautions discussed.   Filed Vitals:   10/10/15 1025  BP: 144/89  Pulse: 83  Temp: 98.3 F (36.8 C)  TempSrc: Oral  Resp: 16  SpO2: 100%      Jaynie Crumble, PA-C 10/10/15 1638  Lorre Nick, MD 10/13/15 1421

## 2015-10-10 NOTE — Discharge Instructions (Signed)
Amoxil as prescribed until all gone. Continue aleve for pain. Follow up with your dentist.   Dental Pain Dental pain may be caused by many things, including:  Tooth decay (cavities or caries). Cavities expose the nerve of your tooth to air and hot or cold temperatures. This can cause pain or discomfort.  Abscess or infection. A dental abscess is a collection of infected pus from a bacterial infection in the inner part of the tooth (pulp). It usually occurs at the end of the tooth's root.  Injury.  An unknown reason (idiopathic). Your pain may be mild or severe. It may only occur when:  You are chewing.  You are exposed to hot or cold temperature.  You are eating or drinking sugary foods or beverages, such as soda or candy. Your pain may also be constant. HOME CARE INSTRUCTIONS Watch your dental pain for any changes. The following actions may help to lessen any discomfort that you are feeling:  Take medicines only as directed by your dentist.  If you were prescribed an antibiotic medicine, finish all of it even if you start to feel better.  Keep all follow-up visits as directed by your dentist. This is important.  Do not apply heat to the outside of your face.  Rinse your mouth or gargle with salt water if directed by your dentist. This helps with pain and swelling.  You can make salt water by adding  tsp of salt to 1 cup of warm water.  Apply ice to the painful area of your face:  Put ice in a plastic bag.  Place a towel between your skin and the bag.  Leave the ice on for 20 minutes, 2-3 times per day.  Avoid foods or drinks that cause you pain, such as:  Very hot or very cold foods or drinks.  Sweet or sugary foods or drinks. SEEK MEDICAL CARE IF:  Your pain is not controlled with medicines.  Your symptoms are worse.  You have new symptoms. SEEK IMMEDIATE MEDICAL CARE IF:  You are unable to open your mouth.  You are having trouble breathing or  swallowing.  You have a fever.  Your face, neck, or jaw is swollen.   This information is not intended to replace advice given to you by your health care provider. Make sure you discuss any questions you have with your health care provider.   Document Released: 07/17/2005 Document Revised: 12/01/2014 Document Reviewed: 07/13/2014 Elsevier Interactive Patient Education [employer]2016 Elsevier Inc.

## 2015-10-10 NOTE — ED Notes (Signed)
Declined W/C at D/C and was escorted to lobby by RN. 

## 2015-10-10 NOTE — ED Notes (Signed)
PT reports dental pain started on SAT.

## 2016-10-23 IMAGING — DX DG SHOULDER 2+V*L*
4 series · 4 of 4 positions shown · non-contrast
Comparison: None.

CLINICAL DATA: Fall on left shoulder 1 month ago. Worsening pain
over that time period.

EXAM:
LEFT SHOULDER - 2+ VIEW

[w shoulder internal left]
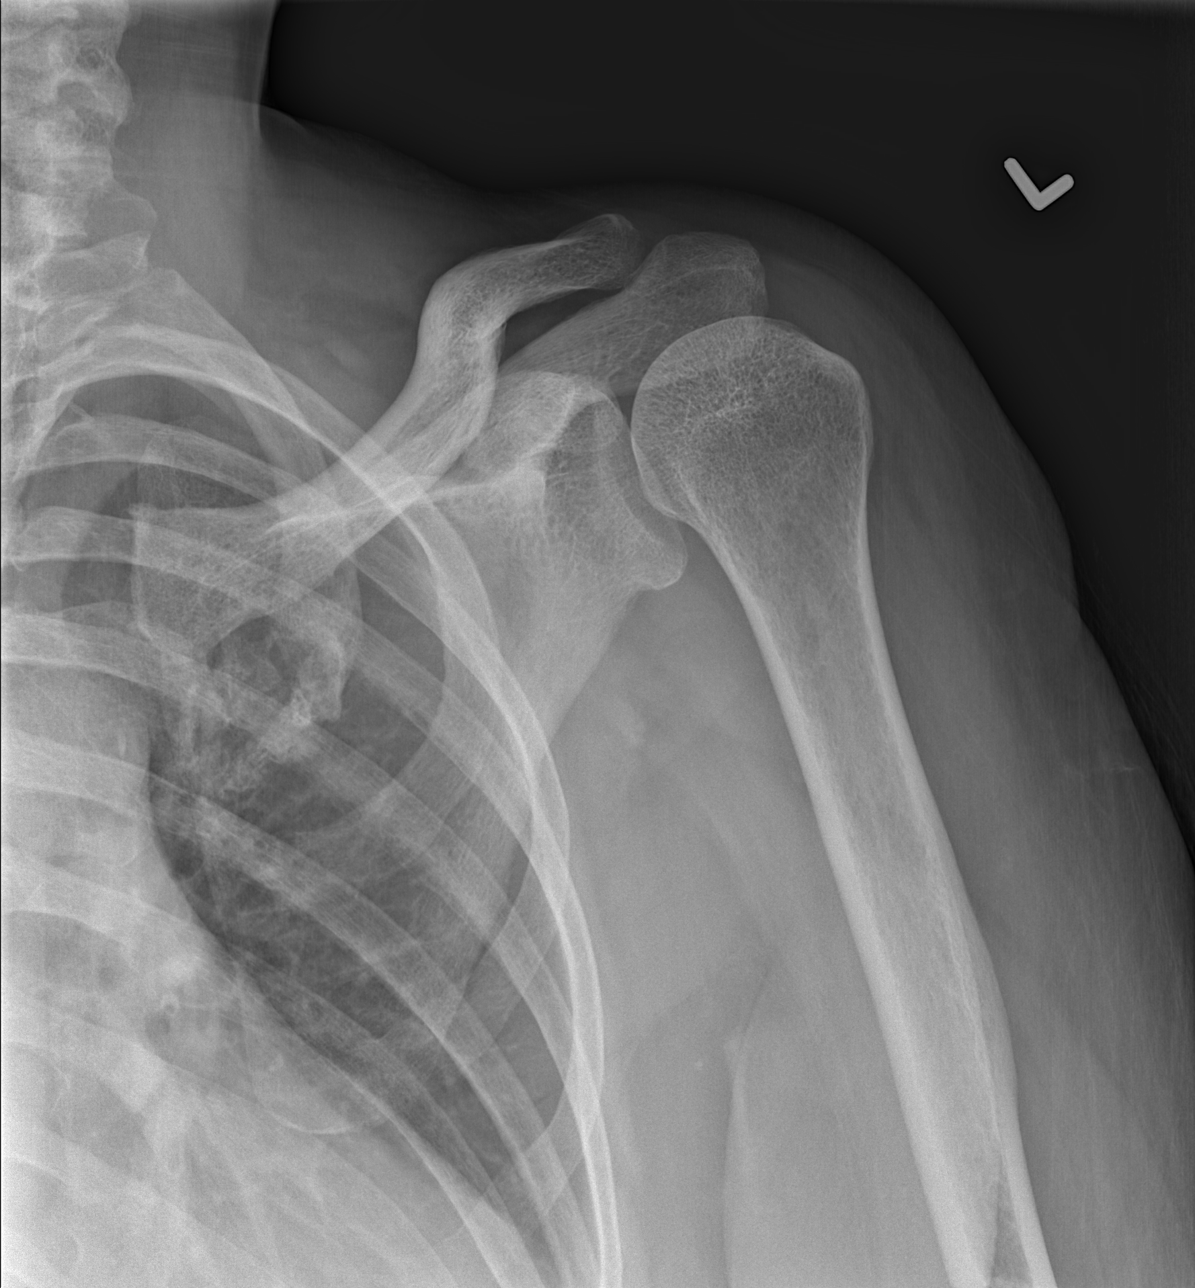

[w shoulder y-view left]
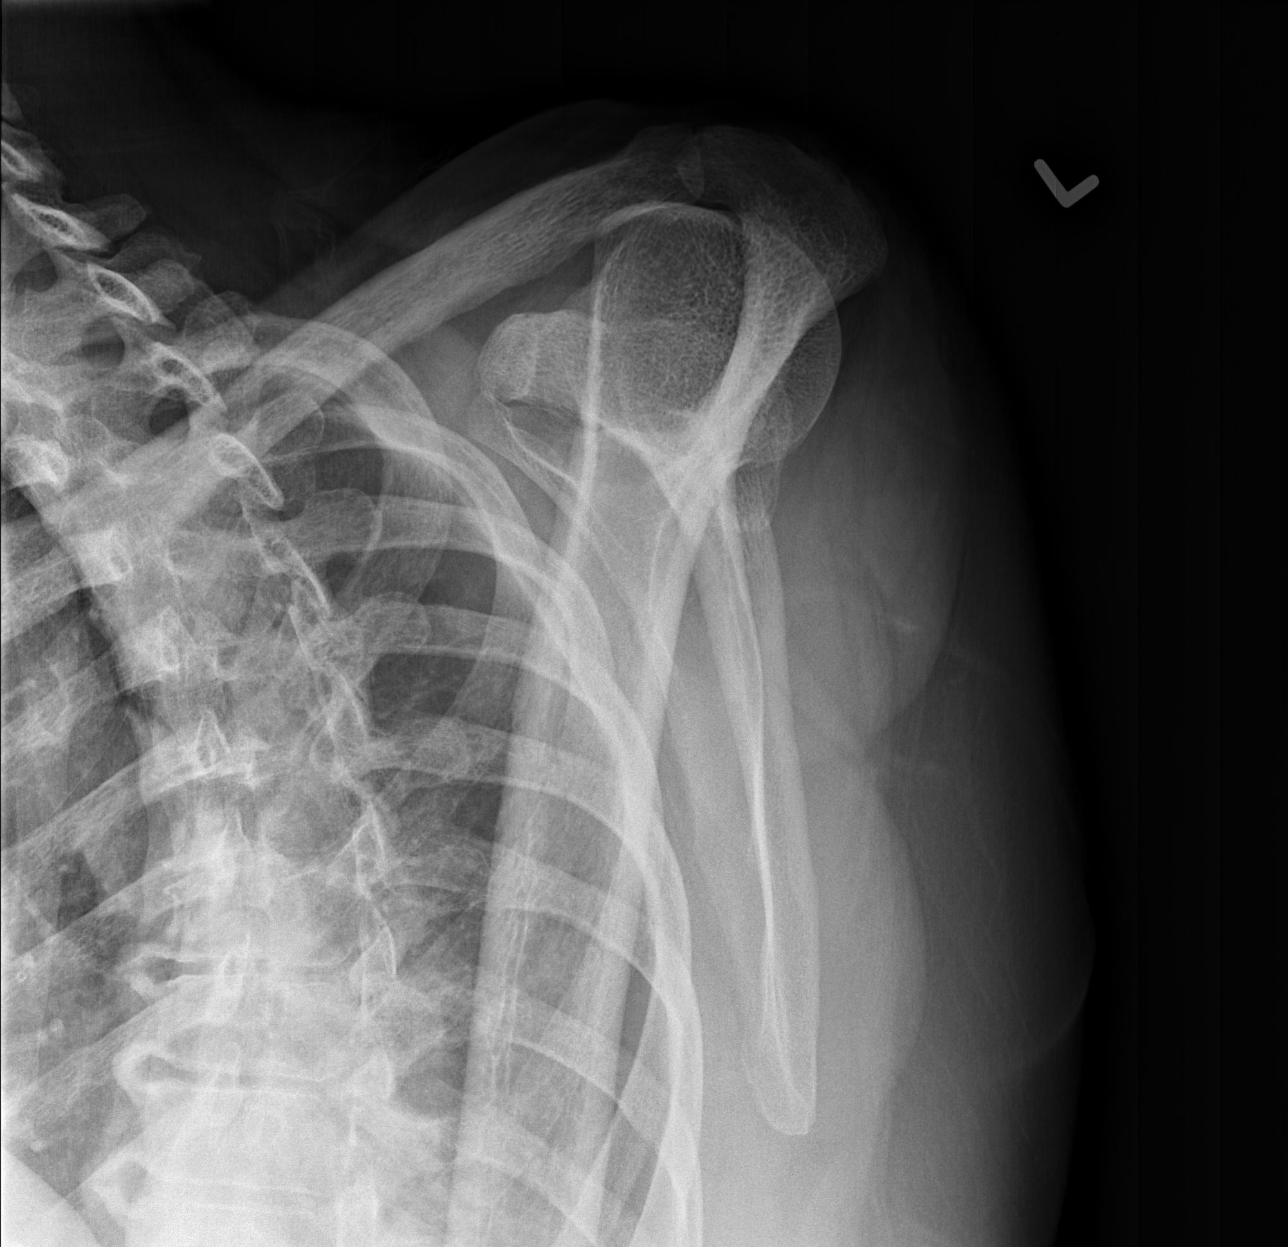

[w shoulder axillary left]
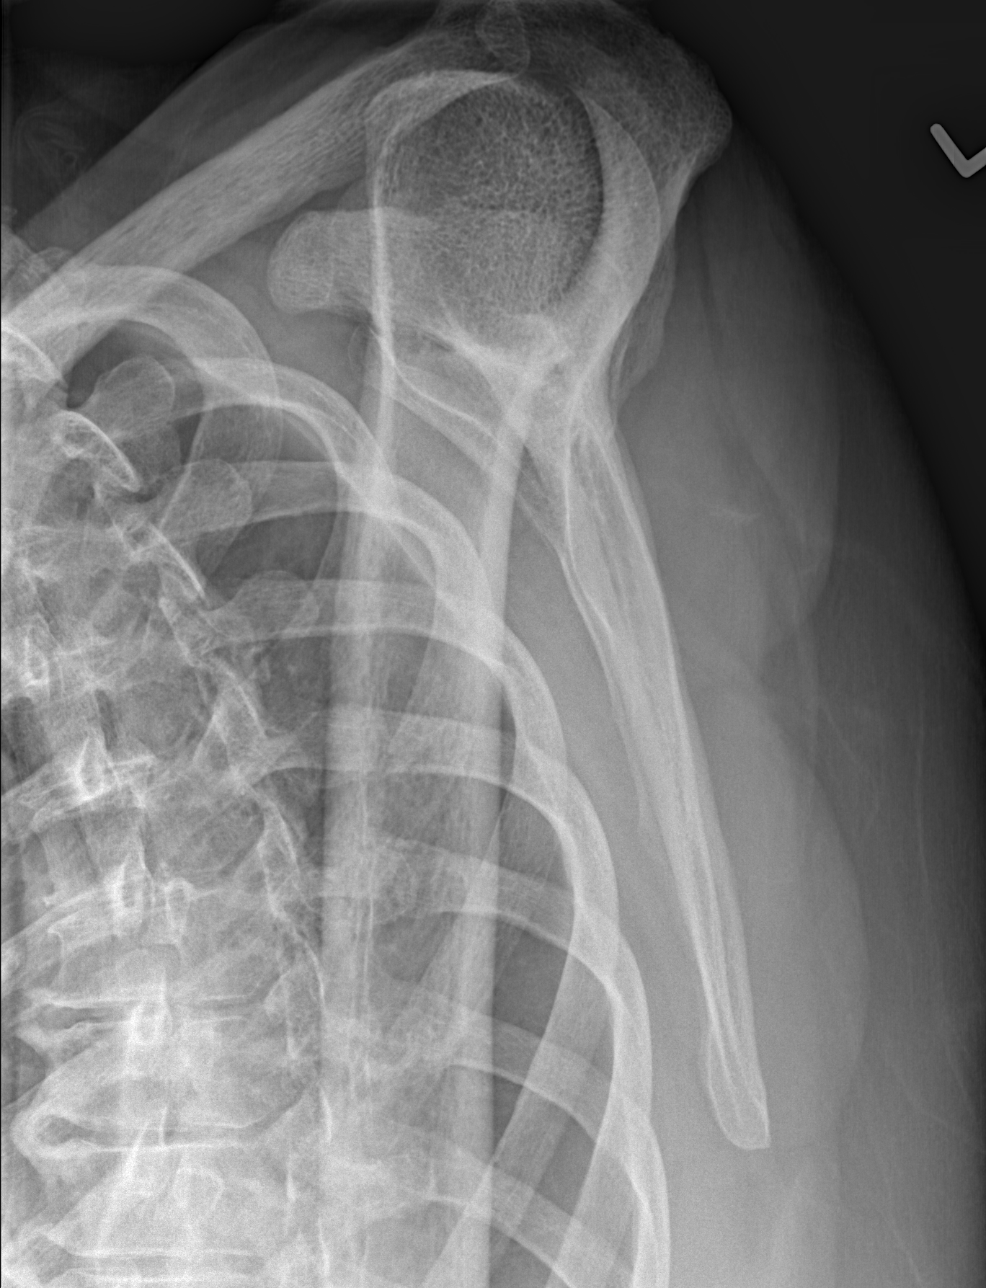

[x shoulder axillary left]
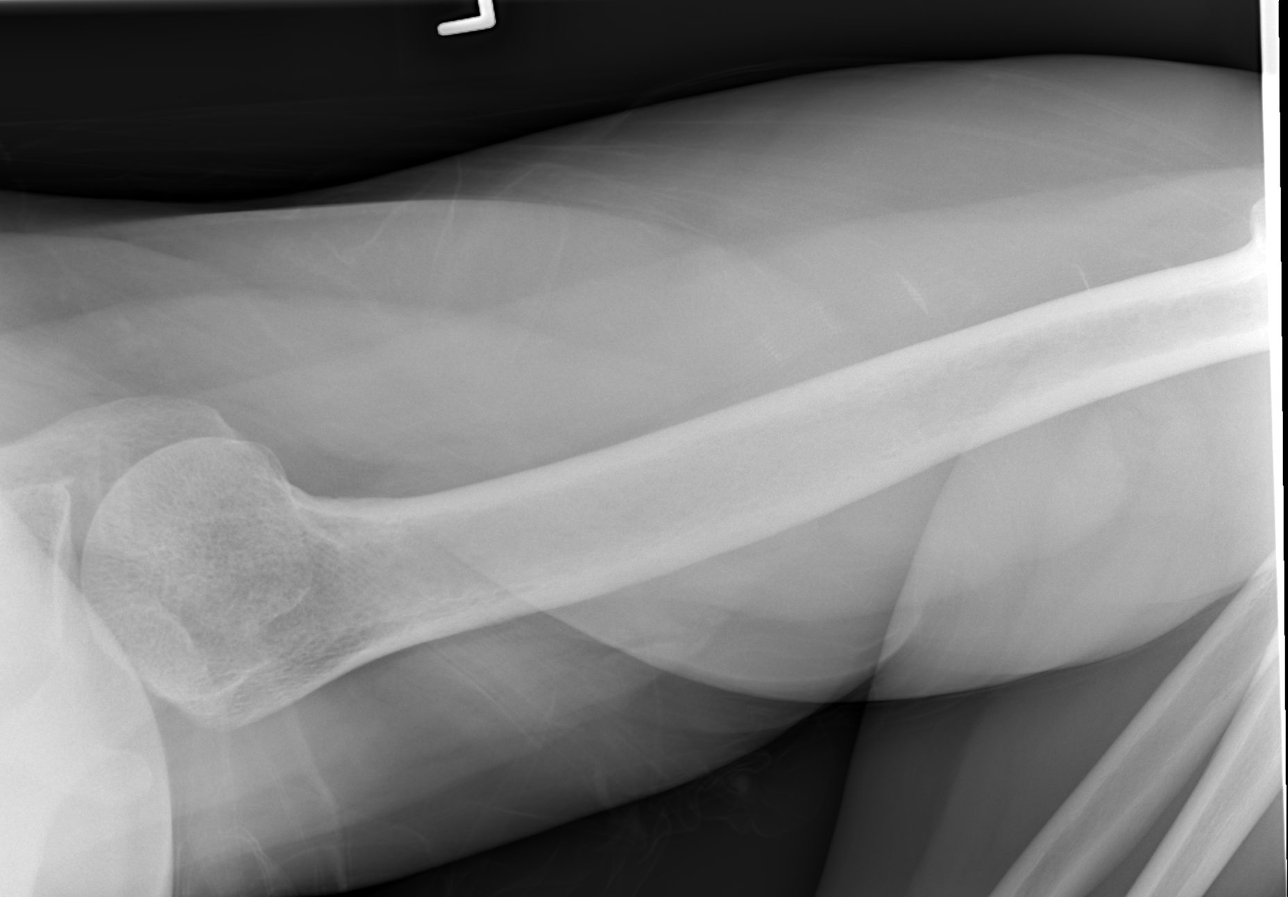

[4 of 4 positions shown; findings below may reference images not displayed]

FINDINGS: There is no evidence of fracture or dislocation. There is no
evidence of arthropathy or other focal bone abnormality. Soft
tissues are unremarkable.
IMPRESSION: Negative.

## 2017-03-27 ENCOUNTER — Encounter (INDEPENDENT_AMBULATORY_CARE_PROVIDER_SITE_OTHER): Payer: Self-pay | Admitting: Physician Assistant

## 2017-03-27 ENCOUNTER — Ambulatory Visit (INDEPENDENT_AMBULATORY_CARE_PROVIDER_SITE_OTHER): Payer: Self-pay | Admitting: Physician Assistant

## 2017-03-27 VITALS — BP 133/77 | HR 78 | Temp 97.9°F | Wt 228.4 lb

## 2017-03-27 DIAGNOSIS — M5442 Lumbago with sciatica, left side: Secondary | ICD-10-CM

## 2017-03-27 DIAGNOSIS — G8929 Other chronic pain: Secondary | ICD-10-CM

## 2017-03-27 DIAGNOSIS — F411 Generalized anxiety disorder: Secondary | ICD-10-CM

## 2017-03-27 DIAGNOSIS — Z131 Encounter for screening for diabetes mellitus: Secondary | ICD-10-CM

## 2017-03-27 LAB — POCT GLYCOSYLATED HEMOGLOBIN (HGB A1C): Hemoglobin A1C: 5.7

## 2017-03-27 MED ORDER — SERTRALINE HCL 50 MG PO TABS: 50.0000 mg | ORAL_TABLET | Freq: Every day | ORAL | 3 refills | Status: DC

## 2017-03-27 MED ORDER — CLONAZEPAM 0.5 MG PO TABS: 0.5000 mg | ORAL_TABLET | Freq: Two times a day (BID) | ORAL | 0 refills | Status: DC | PRN

## 2017-03-27 MED ORDER — NAPROXEN 500 MG PO TABS: 500.0000 mg | ORAL_TABLET | Freq: Two times a day (BID) | ORAL | 0 refills | Status: DC

## 2017-03-27 NOTE — Patient Instructions (Signed)
Sciatica Sciatica is pain, numbness, weakness, or tingling along the path of the sciatic nerve. The sciatic nerve starts in the lower back and runs down the back of each leg. The nerve controls the muscles in the lower leg and in the back of the knee. It also provides feeling (sensation) to the back of the thigh, the lower leg, and the sole of the foot. Sciatica is a symptom of another medical condition that pinches or puts pressure on the sciatic nerve. Generally, sciatica only affects one side of the body. Sciatica usually goes away on its own or with treatment. In some cases, sciatica may keep coming back (recur). What are the causes? This condition is caused by pressure on the sciatic nerve, or pinching of the sciatic nerve. This may be the result of:  A disk in between the bones of the spine (vertebrae) bulging out too far (herniated disk).  Age-related changes in the spinal disks (degenerative disk disease).  A pain disorder that affects a muscle in the buttock (piriformis syndrome).  Extra bone growth (bone spur) near the sciatic nerve.  An injury or break (fracture) of the pelvis.  Pregnancy.  Tumor (rare). What increases the risk? The following factors may make you more likely to develop this condition:  Playing sports that place pressure or stress on the spine, such as football or weight lifting.  Having poor strength and flexibility.  A history of back injury.  A history of back surgery.  Sitting for long periods of time.  Doing activities that involve repetitive bending or lifting.  Obesity. What are the signs or symptoms? Symptoms can vary from mild to very severe, and they may include:  Any of these problems in the lower back, leg, hip, or buttock:  Mild tingling or dull aches.  Burning sensations.  Sharp pains.  Numbness in the back of the calf or the sole of the foot.  Leg weakness.  Severe back pain that makes movement difficult. These symptoms may  get worse when you cough, sneeze, or laugh, or when you sit or stand for long periods of time. Being overweight may also make symptoms worse. In some cases, symptoms may recur over time. How is this diagnosed? This condition may be diagnosed based on:  Your symptoms.  A physical exam. Your health care provider may ask you to do certain movements to check whether those movements trigger your symptoms.  You may have tests, including:  Blood tests.  X-rays.  MRI.  CT scan. How is this treated? In many cases, this condition improves on its own, without any treatment. However, treatment may include:  Reducing or modifying physical activity during periods of pain.  Exercising and stretching to strengthen your abdomen and improve the flexibility of your spine.  Icing and applying heat to the affected area.  Medicines that help:  To relieve pain and swelling.  To relax your muscles.  Injections of medicines that help to relieve pain, irritation, and inflammation around the sciatic nerve (steroids).  Surgery. Follow these instructions at home: Medicines   Take over-the-counter and prescription medicines only as told by your health care provider.  Do not drive or operate heavy machinery while taking prescription pain medicine. Managing pain   If directed, apply ice to the affected area.  Put ice in a plastic bag.  Place a towel between your skin and the bag.  Leave the ice on for 20 minutes, 2-3 times a day.  After icing, apply heat to the   affected area before you exercise or as often as told by your health care provider. Use the heat source that your health care provider recommends, such as a moist heat pack or a heating pad.  Place a towel between your skin and the heat source.  Leave the heat on for 20-30 minutes.  Remove the heat if your skin turns bright red. This is especially important if you are unable to feel pain, heat, or cold. You may have a greater risk of  getting burned. Activity   Return to your normal activities as told by your health care provider. Ask your health care provider what activities are safe for you.  Avoid activities that make your symptoms worse.  Take brief periods of rest throughout the day. Resting in a lying or standing position is usually better than sitting to rest.  When you rest for longer periods, mix in some mild activity or stretching between periods of rest. This will help to prevent stiffness and pain.  Avoid sitting for long periods of time without moving. Get up and move around at least one time each hour.  Exercise and stretch regularly, as told by your health care provider.  Do not lift anything that is heavier than 10 lb (4.5 kg) while you have symptoms of sciatica. When you do not have symptoms, you should still avoid heavy lifting, especially repetitive heavy lifting.  When you lift objects, always use proper lifting technique, which includes:  Bending your knees.  Keeping the load close to your body.  Avoiding twisting. General instructions   Use good posture.  Avoid leaning forward while sitting.  Avoid hunching over while standing.  Maintain a healthy weight. Excess weight puts extra stress on your back and makes it difficult to maintain good posture.  Wear supportive, comfortable shoes. Avoid wearing high heels.  Avoid sleeping on a mattress that is too soft or too hard. A mattress that is firm enough to support your back when you sleep may help to reduce your pain.  Keep all follow-up visits as told by your health care provider. This is important. Contact a health care provider if:  You have pain that wakes you up when you are sleeping.  You have pain that gets worse when you lie down.  Your pain is worse than you have experienced in the past.  Your pain lasts longer than 4 weeks.  You experience unexplained weight loss. Get help right away if:  You lose control of your bowel  or bladder (incontinence).  You have:  Weakness in your lower back, pelvis, buttocks, or legs that gets worse.  Redness or swelling of your back.  A burning sensation when you urinate. This information is not intended to replace advice given to you by your health care provider. Make sure you discuss any questions you have with your health care provider. Document Released: 07/11/2001 Document Revised: 12/21/2015 Document Reviewed: 03/26/2015 Elsevier Interactive Patient Education  2017 [employer]  

## 2017-03-27 NOTE — Progress Notes (Signed)
Subjective:  Patient ID: Leah Hernandez, female    DOB: 1961-01-09  Age: 56 y.o. MRN: [id number]  CC: new patient  HPI Leah Hernandez is a 56 y.o. female with a medical history of dental pain presents today as a new patient to address left sided lower back pain that radiates to the left leg. Feels like a "cramp, sharp pain". Sometimes feels tingling in the bottom of the feet. Onset approximately one year ago. Also had a fall approximately one year ago, felt a little "cringe" in the same area that is bothering her now. Pain is felt almost everyday and worse with prolonged walking on concrete. Has taken Motrin with little relief. No current pain. Does not endorse saddle paresthesia, urinary incontinence, fecal incontinence, weakness, or paralysis.    High GAD7 score today. Attributed to high stress due to personal situations. Worries constantly. Has difficulty initiating and maintaining sleep. No suicidal ideation/intent.    Outpatient Medications Prior to Visit  Medication Sig Dispense Refill  . amoxicillin (AMOXIL) 500 MG capsule Take 1 capsule (500 mg total) by mouth 3 (three) times daily. (Patient not taking: Reported on 03/27/2017) 30 capsule 0  . HYDROcodone-acetaminophen (NORCO/VICODIN) 5-325 MG tablet Take 1-2 tablets by mouth every 6 hours as needed for pain and/or cough. (Patient not taking: Reported on 03/27/2017) 13 tablet 0  . hydrOXYzine (ATARAX/VISTARIL) 25 MG tablet Take 1 tablet (25 mg total) by mouth every 6 (six) hours. (Patient not taking: Reported on 03/27/2017) 12 tablet 0  . ibuprofen (ADVIL,MOTRIN) 400 MG tablet Take 1 tablet (400 mg total) by mouth every 6 (six) hours as needed. (Patient not taking: Reported on 03/27/2017) 30 tablet 0  . loratadine (CLARITIN) 10 MG tablet Take 1 tablet (10 mg total) by mouth daily. (Patient not taking: Reported on 03/27/2017) 14 tablet 0  . polyethylene glycol (MIRALAX / GLYCOLAX) packet Take 17 g by mouth daily. (Patient not taking:  Reported on 03/27/2017) 14 each 0   No facility-administered medications prior to visit.      ROS Review of Systems  Constitutional: Negative for chills, fever and malaise/fatigue.  Eyes: Negative for blurred vision.  Respiratory: Negative for shortness of breath.   Cardiovascular: Negative for chest pain and palpitations.  Gastrointestinal: Negative for abdominal pain and nausea.  Genitourinary: Negative for dysuria and hematuria.  Musculoskeletal: Positive for back pain. Negative for joint pain and myalgias.  Skin: Negative for rash.  Neurological: Negative for tingling and headaches.  Psychiatric/Behavioral: Negative for depression. The patient is nervous/anxious.     Objective:  BP 133/77 (BP Location: Left Arm, Patient Position: Sitting, Cuff Size: Large)   Pulse 78   Temp 97.9 F (36.6 C) (Oral)   Wt 228 lb 6.4 oz (103.6 kg)   SpO2 99%   BMI 34.73 kg/m   BP/Weight 03/27/2017 10/10/2015 06/22/2015  Systolic BP 133 144 138  Diastolic BP 77 89 88  Wt. (Lbs) 228.4 - -  BMI 34.73 - -      Physical Exam  Constitutional: She is oriented to person, place, and time.  Well developed, overweight, NAD, polite  HENT:  Head: Normocephalic and atraumatic.  Eyes: No scleral icterus.  Neck: Normal range of motion. Neck supple. No thyromegaly present.  Cardiovascular: Normal rate, regular rhythm and normal heart sounds.   Pulmonary/Chest: Effort normal and breath sounds normal.  Musculoskeletal: She exhibits no edema.  Neurological: She is alert and oriented to person, place, and time. No cranial nerve deficit. Coordination normal.  Seated SLR negative  Skin: Skin is warm and dry. No rash noted. No erythema. No pallor.  Psychiatric: She has a normal mood and affect. Her behavior is normal. Thought content normal.  Constricted facial expressions  Vitals reviewed.    Assessment & Plan:    1. Chronic left-sided low back pain with left-sided sciatica - DG Lumbar Spine  Complete; Future - Begin naproxen (NAPROSYN) 500 MG tablet; Take 1 tablet (500 mg total) by mouth 2 (two) times daily with a meal.  Dispense: 30 tablet; Refill: 0  2. Generalized anxiety disorder - Begin sertraline (ZOLOFT) 50 MG tablet; Take 1 tablet (50 mg total) by mouth daily.  Dispense: 30 tablet; Refill: 3 - Begin clonazePAM (KLONOPIN) 0.5 MG tablet; Take 1 tablet (0.5 mg total) by mouth 2 (two) times daily as needed for anxiety.  Dispense: 30 tablet; Refill: 0  3. Screening for diabetes mellitus - HgB A1c 5.7%    Meds ordered this encounter  Medications  . sertraline (ZOLOFT) 50 MG tablet    Sig: Take 1 tablet (50 mg total) by mouth daily.    Dispense:  30 tablet    Refill:  3    Order Specific Question:   Supervising Provider    Answer:   Quentin Angst [id number]  . clonazePAM (KLONOPIN) 0.5 MG tablet    Sig: Take 1 tablet (0.5 mg total) by mouth 2 (two) times daily as needed for anxiety.    Dispense:  30 tablet    Refill:  0    Order Specific Question:   Supervising Provider    Answer:   Quentin Angst [id number]  . naproxen (NAPROSYN) 500 MG tablet    Sig: Take 1 tablet (500 mg total) by mouth 2 (two) times daily with a meal.    Dispense:  30 tablet    Refill:  0    Order Specific Question:   Supervising Provider    Answer:   Quentin Angst [[id number]]    Follow-up: Return in about 4 weeks (around 04/24/2017) for anxiety and back pain.   Leah Specter PA

## 2017-04-01 ENCOUNTER — Emergency Department (HOSPITAL_COMMUNITY)
Admission: EM | Admit: 2017-04-01 | Discharge: 2017-04-01 | Disposition: A | Discharge status: Home / Self Care | Payer: Self-pay | Attending: Emergency Medicine | Admitting: Emergency Medicine

## 2017-04-01 ENCOUNTER — Encounter (HOSPITAL_COMMUNITY): Payer: Self-pay | Admitting: Emergency Medicine

## 2017-04-01 DIAGNOSIS — L235 Allergic contact dermatitis due to other chemical products: Secondary | ICD-10-CM | POA: Insufficient documentation

## 2017-04-01 MED ORDER — FAMOTIDINE 20 MG PO TABS: 20.0000 mg | ORAL_TABLET | Freq: Two times a day (BID) | ORAL | 0 refills | Status: DC

## 2017-04-01 MED ORDER — PREDNISONE 10 MG PO TABS: 40.0000 mg | ORAL_TABLET | Freq: Every day | ORAL | 0 refills | Status: AC

## 2017-04-01 MED ORDER — PREDNISONE 20 MG PO TABS
60.0000 mg | ORAL_TABLET | Freq: Once | ORAL | Status: AC
  Administered 2017-04-01: 60 mg via ORAL
  Filled 2017-04-01: qty 3

## 2017-04-01 MED ORDER — FAMOTIDINE 20 MG PO TABS
20.0000 mg | ORAL_TABLET | Freq: Once | ORAL | Status: AC
  Administered 2017-04-01: 20 mg via ORAL
  Filled 2017-04-01: qty 1

## 2017-04-01 MED ORDER — DIPHENHYDRAMINE HCL 25 MG PO CAPS
25.0000 mg | ORAL_CAPSULE | Freq: Four times a day (QID) | ORAL | Status: DC | PRN
  Administered 2017-04-01: 25 mg via ORAL
  Filled 2017-04-01: qty 1

## 2017-04-01 MED ORDER — DIPHENHYDRAMINE HCL 25 MG PO CAPS: 25.0000 mg | ORAL_CAPSULE | Freq: Four times a day (QID) | ORAL | 0 refills | Status: DC | PRN

## 2017-04-01 MED ORDER — PREDNISONE 20 MG PO TABS: 40.0000 mg | ORAL_TABLET | Freq: Once | ORAL | Status: DC

## 2017-04-01 NOTE — ED Triage Notes (Signed)
Pt. Stated, I started having some swelling and itching about 2 days ago. Only thing new is clothes detergent.

## 2017-04-01 NOTE — Discharge Instructions (Signed)
Start taking the steroid tomorrow as prescribed. He received her first dose today in the emergency department. Take Benadryl every 6 hours as needed for itching, and Pepcid twice daily for itching. Follow-up with primary care for reevaluation of your rash. Return to the ED immediately if any concerning signs or symptoms develop such as fevers, nausea, vomiting, throat tightness, difficulty swallowing, drooling, or shortness of breath.

## 2017-04-01 NOTE — ED Notes (Signed)
Declined W/C at D/C and was escorted to lobby by RN. 

## 2017-04-01 NOTE — ED Provider Notes (Signed)
[facility]-EMERGENCY DEPT Provider Note   CSN: [id number] Arrival date & time: 04/01/17  0802     History   Chief Complaint Chief Complaint  Patient presents with  . Allergic Reaction  . Pruritis    HPI Leah Hernandez is a 56 y.o. female who presents today with chief complaint acute onset, progressively worsening rash for 2 days. Rash is pruritic, began in her face and has spread to her abdomen and thighs. She has tried topical corticosteroid cream without significant relief. She denies fevers, difficulty breathing, throat tightness, facial swelling, SOB or CP. Denies abdominal pain, nausea, or vomiting. States that the only other time she had similar symptoms was after administration of antibiotics. She denies any new medications. She did start using a new detergent one week ago, and thinks this is the result of her rash. Denies new lotions, shampoos, or detergents. She also states that she was doing yard work a few days ago but is unsure if she had any sick bites or exposure to suspicious plants. No one at home has a similar rash.  The history is provided by the patient.    History reviewed. No pertinent past medical history.  There are no active problems to display for this patient.   Past Surgical History:  Procedure Laterality Date  . TUBAL LIGATION      OB History    No data available       Home Medications    Prior to Admission medications   Medication Sig Start Date End Date Taking? Authorizing Provider  clonazePAM (KLONOPIN) 0.5 MG tablet Take 1 tablet (0.5 mg total) by mouth 2 (two) times daily as needed for anxiety. 03/27/17   Loletta Specter, PA-C  diphenhydrAMINE (BENADRYL) 25 mg capsule Take 1 capsule (25 mg total) by mouth every 6 (six) hours as needed for itching. 04/01/17   Lekeya Rollings A, PA-C  famotidine (PEPCID) 20 MG tablet Take 1 tablet (20 mg total) by mouth 2 (two) times daily. 04/01/17   Luevenia Maxin, Breion Novacek A, PA-C  naproxen (NAPROSYN) 500 MG tablet Take 1  tablet (500 mg total) by mouth 2 (two) times daily with a meal. 03/27/17   Loletta Specter, PA-C  predniSONE (DELTASONE) 10 MG tablet Take 4 tablets (40 mg total) by mouth daily. 04/01/17 04/05/17  Michela Pitcher A, PA-C  sertraline (ZOLOFT) 50 MG tablet Take 1 tablet (50 mg total) by mouth daily. 03/27/17   Loletta Specter, PA-C    Family History No family history on file.  Social History Social History  Substance Use Topics  . Smoking status: Never Smoker  . Smokeless tobacco: Never Used  . Alcohol use No     Allergies   Patient has no known allergies.   Review of Systems Review of Systems  Constitutional: Positive for chills. Negative for fever.  HENT: Negative for facial swelling and trouble swallowing.   Respiratory: Negative for shortness of breath.   Cardiovascular: Negative for chest pain.  Gastrointestinal: Negative for abdominal pain, blood in stool, diarrhea, nausea and vomiting.  Genitourinary: Negative for dysuria and hematuria.  Skin: Positive for rash.  Neurological: Negative for light-headedness and headaches.     Physical Exam Updated Vital Signs BP 123/83 (BP Location: Left Arm)   Pulse 90   Temp 98.1 F (36.7 C)   Resp 17   Ht 5\' 6"  (1.676 m)   Wt 103.4 kg (228 lb)   SpO2 99%   BMI 36.80 kg/m   Physical Exam  Constitutional: She appears well-developed and well-nourished. No distress.  HENT:  Head: Normocephalic and atraumatic.  Right Ear: External ear normal.  Left Ear: External ear normal.  Mouth/Throat: Oropharynx is clear and moist.  No swelling of the lips or tongue, no trismus, no uvular deviation, no sublingual abnormalities.  Eyes: Conjunctivae are normal. Right eye exhibits no discharge. Left eye exhibits no discharge.  Neck: Normal range of motion. Neck supple. No JVD present. No tracheal deviation present.  Cardiovascular: Normal rate, regular rhythm and normal heart sounds.   Pulmonary/Chest: Effort normal and breath sounds normal.  No stridor. She has no wheezes.  Abdominal: She exhibits no distension.  Musculoskeletal: She exhibits no edema.  Neurological: She is alert.  Skin: Skin is warm and dry. Rash noted. There is erythema.  Raised erythematous urticarial rash noted to the face, chest, abdomen, and lower extremities. Rash spares the palms and soles. Excoriations noted. No vesicles, pustules, blisters, or desquamation noted  Psychiatric: She has a normal mood and affect. Her behavior is normal.  Nursing note and vitals reviewed.    ED Treatments / Results  Labs (all labs ordered are listed, but only abnormal results are displayed) Labs Reviewed - No data to display  EKG  EKG Interpretation None       Radiology No results found.  Procedures Procedures (including critical care time)  Medications Ordered in ED Medications  diphenhydrAMINE (BENADRYL) capsule 25 mg (not administered)  predniSONE (DELTASONE) tablet 60 mg (not administered)  famotidine (PEPCID) tablet 20 mg (not administered)     Initial Impression / Assessment and Plan / ED Course  I have reviewed the triage vital signs and the nursing notes.  Pertinent labs & imaging results that were available during my care of the patient were reviewed by me and considered in my medical decision making (see chart for details).     Rash consistent with allergic reaction to detergent. Patient denies any difficulty breathing or swallowing.  Pt has a patent airway without stridor and is handling secretions without difficulty; no angioedema. No blisters, no pustules, no warmth, no draining sinus tracts, no superficial abscesses, no bullous impetigo, no vesicles, no desquamation, no target lesions with dusky purpura or a central bulla. Not tender to touch. No concern for superimposed infection. No concern for SJS, TEN, TSS, tick borne illness, syphilis or other life-threatening condition. Will discharge home with short course of steroids, pepcid and  recommend Benadryl as needed for pruritis. Discussed indications for return to the ED. Recommend follow-up with primary care physician in 3 days for reevaluation if rash persists. Pt verbalized understanding of and agreement with plan and is safe for discharge home at this time.  Final Clinical Impressions(s) / ED Diagnoses   Final diagnoses:  Allergic dermatitis due to other chemical product    New Prescriptions New Prescriptions   DIPHENHYDRAMINE (BENADRYL) 25 MG CAPSULE    Take 1 capsule (25 mg total) by mouth every 6 (six) hours as needed for itching.   FAMOTIDINE (PEPCID) 20 MG TABLET    Take 1 tablet (20 mg total) by mouth 2 (two) times daily.   PREDNISONE (DELTASONE) 10 MG TABLET    Take 4 tablets (40 mg total) by mouth daily.     Jeanie SewerFawze, Rosalind Guido A, PA-C 04/01/17 [id number]    Lorre NickAllen, Anthony, MD 04/02/17 [phone]

## 2017-04-03 MED FILL — predniSONE 10 MG TABS: 10 | 4 days supply | Qty: 16 | Fill #0

## 2017-04-03 MED FILL — clonazePAM 0.5 MG TABS: 0.5 | 15 days supply | Qty: 30 | Fill #0

## 2017-04-16 ENCOUNTER — Emergency Department (HOSPITAL_COMMUNITY)
Admission: EM | Admit: 2017-04-16 | Discharge: 2017-04-16 | Disposition: A | Discharge status: Home / Self Care | Payer: Self-pay | Attending: Emergency Medicine | Admitting: Emergency Medicine

## 2017-04-16 ENCOUNTER — Encounter (HOSPITAL_COMMUNITY): Payer: Self-pay | Admitting: *Deleted

## 2017-04-16 DIAGNOSIS — B029 Zoster without complications: Secondary | ICD-10-CM | POA: Insufficient documentation

## 2017-04-16 MED ORDER — TRAMADOL HCL 50 MG PO TABS: 50.0000 mg | ORAL_TABLET | Freq: Four times a day (QID) | ORAL | 0 refills | Status: DC | PRN

## 2017-04-16 MED ORDER — VALACYCLOVIR HCL 1 G PO TABS: 1000.0000 mg | ORAL_TABLET | Freq: Three times a day (TID) | ORAL | 0 refills | Status: DC

## 2017-04-16 MED ORDER — GABAPENTIN 300 MG PO CAPS: ORAL_CAPSULE | ORAL | 1 refills | Status: DC

## 2017-04-16 MED FILL — GABAPENTIN 300 MG CAPSULE: 300 | 30 days supply | Qty: 90 | Fill #0

## 2017-04-16 MED FILL — traMADol HCL 50 MG TABS: 50 | 4 days supply | Qty: 15 | Fill #0

## 2017-04-16 MED FILL — valACYclovir HCL 1 GM TABS: 1 | 7 days supply | Qty: 21 | Fill #0

## 2017-04-16 NOTE — ED Notes (Signed)
Pt has rash under breast and around right side to her back. Pt reports pain and itching, She states she tried calamine lotion at home with ne relief. Large splotchy rash noted.

## 2017-04-16 NOTE — ED Provider Notes (Signed)
[facility]-EMERGENCY DEPT Provider Note   CSN: [id number] Arrival date & time: 04/16/17  2956     History   Chief Complaint Chief Complaint  Patient presents with  . Rash    HPI Leah Hernandez is a 56 y.o. female.  HPI Pt started with a rash one week ago.  She has tried some home remedies but they have not helped.  The rash is burning and painful.  No fevers but she has felt warm at times and then chilled.  She's noticed some blister like lesions on the right side of her chest and back.  No new medications.no vomiting or diarrhea. No difficulty breathing. History reviewed. No pertinent past medical history.  There are no active problems to display for this patient.   Past Surgical History:  Procedure Laterality Date  . TUBAL LIGATION      OB History    No data available       Home Medications    Prior to Admission medications   Medication Sig Start Date End Date Taking? Authorizing Provider  clonazePAM (KLONOPIN) 0.5 MG tablet Take 1 tablet (0.5 mg total) by mouth 2 (two) times daily as needed for anxiety. 03/27/17   Loletta Specter, PA-C  diphenhydrAMINE (BENADRYL) 25 mg capsule Take 1 capsule (25 mg total) by mouth every 6 (six) hours as needed for itching. 04/01/17   Fawze, Mina A, PA-C  famotidine (PEPCID) 20 MG tablet Take 1 tablet (20 mg total) by mouth 2 (two) times daily. 04/01/17   Luevenia Maxin, Mina A, PA-C  gabapentin (NEURONTIN) 300 MG capsule Take 300 mg by mouth on day 1, increased to 300 mg twice a day on day 2, then take 300 mg 3 times a day 04/16/17   Linwood Dibbles, MD  naproxen (NAPROSYN) 500 MG tablet Take 1 tablet (500 mg total) by mouth 2 (two) times daily with a meal. 03/27/17   Loletta Specter, PA-C  sertraline (ZOLOFT) 50 MG tablet Take 1 tablet (50 mg total) by mouth daily. 03/27/17   Loletta Specter, PA-C  traMADol (ULTRAM) 50 MG tablet Take 1 tablet (50 mg total) by mouth every 6 (six) hours as needed. 04/16/17   Linwood Dibbles, MD  valACYclovir (VALTREX) 1000  MG tablet Take 1 tablet (1,000 mg total) by mouth 3 (three) times daily. 04/16/17   Linwood Dibbles, MD    Family History No family history on file.  Social History Social History  Substance Use Topics  . Smoking status: Never Smoker  . Smokeless tobacco: Never Used  . Alcohol use No     Allergies   Patient has no known allergies.   Review of Systems Review of Systems  All other systems reviewed and are negative.    Physical Exam Updated Vital Signs BP (!) 139/99 (BP Location: Right Arm)   Pulse (!) 110   Temp 99.1 F (37.3 C) (Oral)   Resp 18   Ht 1.753 m ( )   Wt 103.4 kg (228 lb)   SpO2 97%   BMI 33.67 kg/m   Physical Exam  Constitutional: She appears well-developed and well-nourished. No distress.  HENT:  Head: Normocephalic and atraumatic.  Right Ear: External ear normal.  Left Ear: External ear normal.  Eyes: Conjunctivae are normal. Right eye exhibits no discharge. Left eye exhibits no discharge. No scleral icterus.  Neck: Neck supple. No tracheal deviation present.  Cardiovascular: Normal rate, regular rhythm and intact distal pulses.   Pulmonary/Chest: Effort normal and breath sounds  normal. No stridor. No respiratory distress. She has no wheezes. She has no rales.  Abdominal: Soft. Bowel sounds are normal. She exhibits no distension. There is no tenderness. There is no rebound and no guarding.  Musculoskeletal: She exhibits no edema or tenderness.  Neurological: She is alert. She has normal strength. No cranial nerve deficit (no facial droop, extraocular movements intact, no slurred speech) or sensory deficit. She exhibits normal muscle tone. She displays no seizure activity. Coordination normal.  Skin: Skin is warm and dry. Rash noted. Rash is vesicular.  Vesicular rash with surrounding areas of erythema in a dermatomal pattern in the right posterior thorax andanterior chest below the breast  Psychiatric: She has a normal mood and affect.  Nursing note  and vitals reviewed.    ED Treatments / Results    Procedures Procedures (including critical care time)  Medications Ordered in ED Medications - No data to display   Initial Impression / Assessment and Plan / ED Course  I have reviewed the triage vital signs and the nursing notes.  Pertinent labs & imaging results that were available during my care of the patient were reviewed by me and considered in my medical decision making (see chart for details).   the patient's rash is consistent with herpes zoster.  I will start her on Valtrex as well as gabapentin for her neuropathic pain. I have also prescribed Ultram. I discussed theFindings with the patient and recommended follow-up with her primary care doctor next week. I explained the patient that the Neurontin is a medication that often needs to be titrated to a higher dose.  We discussed avoiding contact with people that could potentially be immunocompromised.  Final Clinical Impressions(s) / ED Diagnoses   Final diagnoses:  Herpes zoster without complication    New Prescriptions Discharge Medication List as of 04/16/2017  9:28 AM    START taking these medications   Details  gabapentin (NEURONTIN) 300 MG capsule Take 300 mg by mouth on day 1, increased to 300 mg twice a day on day 2, then take 300 mg 3 times a day, Print    traMADol (ULTRAM) 50 MG tablet Take 1 tablet (50 mg total) by mouth every 6 (six) hours as needed., Starting Mon 04/16/2017, Print    valACYclovir (VALTREX) 1000 MG tablet Take 1 tablet (1,000 mg total) by mouth 3 (three) times daily., Starting Mon 04/16/2017, Print         Linwood Dibbles, MD 04/16/17 1003

## 2017-04-16 NOTE — Discharge Instructions (Signed)
Take the medications as prescribed. Follow up with your  primary care doctor as we discussed

## 2017-04-16 NOTE — ED Triage Notes (Signed)
To ED for eval of 'rash' that was treated 2 wks ago. Never went away. Appears to be blistered and runs under right breast around to back. Painful.

## 2017-04-16 NOTE — ED Notes (Signed)
ED Provider at bedside. 

## 2017-04-23 ENCOUNTER — Encounter (INDEPENDENT_AMBULATORY_CARE_PROVIDER_SITE_OTHER): Payer: Self-pay | Admitting: Physician Assistant

## 2017-04-23 ENCOUNTER — Ambulatory Visit (INDEPENDENT_AMBULATORY_CARE_PROVIDER_SITE_OTHER): Payer: Self-pay | Admitting: Physician Assistant

## 2017-04-23 VITALS — BP 126/75 | HR 98 | Temp 98.9°F | Wt 236.8 lb

## 2017-04-23 DIAGNOSIS — M5442 Lumbago with sciatica, left side: Secondary | ICD-10-CM

## 2017-04-23 DIAGNOSIS — F411 Generalized anxiety disorder: Secondary | ICD-10-CM

## 2017-04-23 DIAGNOSIS — G8929 Other chronic pain: Secondary | ICD-10-CM

## 2017-04-23 DIAGNOSIS — B028 Zoster with other complications: Secondary | ICD-10-CM

## 2017-04-23 DIAGNOSIS — L0889 Other specified local infections of the skin and subcutaneous tissue: Secondary | ICD-10-CM

## 2017-04-23 MED ORDER — SERTRALINE HCL 50 MG PO TABS: 50.0000 mg | ORAL_TABLET | Freq: Every day | ORAL | 3 refills | Status: DC

## 2017-04-23 MED ORDER — CEPHALEXIN 500 MG PO TABS: 500.0000 mg | ORAL_TABLET | Freq: Three times a day (TID) | ORAL | 0 refills | Status: AC

## 2017-04-23 MED ORDER — NAPROXEN 500 MG PO TABS: 500.0000 mg | ORAL_TABLET | Freq: Two times a day (BID) | ORAL | 0 refills | Status: DC

## 2017-04-23 MED FILL — NAPROXEN 500 MG TABLET: 500 | 30 days supply | Qty: 30 | Fill #0

## 2017-04-23 MED FILL — ?SERTRALINE HCL 50 MG TABLE: 50 | 30 days supply | Qty: 30 | Fill #0

## 2017-04-23 MED FILL — CEPHALEXIN 500 MG CAPSULE: 500 | 10 days supply | Qty: 30 | Fill #0

## 2017-04-23 NOTE — Progress Notes (Signed)
Pt currently being treated for shingles

## 2017-04-23 NOTE — Patient Instructions (Signed)

## 2017-04-23 NOTE — Progress Notes (Signed)
Subjective:  Patient ID: Leah Hernandez, female    DOB: [birth year]  Age: 56 y.o. MRN: [id number]  CC: back pain and anxiety  HPI Leah Hernandez is a 56 y.o. female with a medical history of anxiety and left sided lower back pain presents to f/u on these conditions. XR of lower back was ordered nearly four weeks ago but patient has not gone to have XR done because of the onset of shingles two weeks ago. Had not taken Naproxen for her lower back. Currently taking Gabapentin, Tramadol, and Acyclovir for shingles.     She is concerned of "wetness" of the shingles lesions under her right breast. There is discomfort and yellowish suppuration. Would like to know how to keep the area dry.   Outpatient Medications Prior to Visit  Medication Sig Dispense Refill  . clonazePAM (KLONOPIN) 0.5 MG tablet Take 1 tablet (0.5 mg total) by mouth 2 (two) times daily as needed for anxiety. 30 tablet 0  . gabapentin (NEURONTIN) 300 MG capsule Take 300 mg by mouth on day 1, increased to 300 mg twice a day on day 2, then take 300 mg 3 times a day 90 capsule 1  . traMADol (ULTRAM) 50 MG tablet Take 1 tablet (50 mg total) by mouth every 6 (six) hours as needed. 15 tablet 0  . valACYclovir (VALTREX) 1000 MG tablet Take 1 tablet (1,000 mg total) by mouth 3 (three) times daily. 21 tablet 0  . diphenhydrAMINE (BENADRYL) 25 mg capsule Take 1 capsule (25 mg total) by mouth every 6 (six) hours as needed for itching. (Patient not taking: Reported on 04/23/2017) 30 capsule 0  . famotidine (PEPCID) 20 MG tablet Take 1 tablet (20 mg total) by mouth 2 (two) times daily. (Patient not taking: Reported on 04/23/2017) 30 tablet 0  . naproxen (NAPROSYN) 500 MG tablet Take 1 tablet (500 mg total) by mouth 2 (two) times daily with a meal. (Patient not taking: Reported on 04/23/2017) 30 tablet 0  . sertraline (ZOLOFT) 50 MG tablet Take 1 tablet (50 mg total) by mouth daily. (Patient not taking: Reported on 04/23/2017) 30 tablet 3   No  facility-administered medications prior to visit.      ROS Review of Systems  Constitutional: Negative for chills, fever and malaise/fatigue.  Eyes: Negative for blurred vision.  Respiratory: Negative for shortness of breath.   Cardiovascular: Negative for chest pain and palpitations.  Gastrointestinal: Negative for abdominal pain and nausea.  Genitourinary: Negative for dysuria and hematuria.  Musculoskeletal: Negative for joint pain and myalgias.  Skin: Negative for rash.       Drainage and redness under right breast.  Neurological: Negative for tingling and headaches.  Psychiatric/Behavioral: Negative for depression. The patient is not nervous/anxious.     Objective:  BP 126/75 (BP Location: Left Arm, Patient Position: Sitting, Cuff Size: Large)   Pulse 98   Temp 98.9 F (37.2 C) (Oral)   Wt 236 lb 12.8 oz (107.4 kg)   SpO2 97%   BMI 34.97 kg/m   BP/Weight 04/23/2017 04/16/2017 04/01/2017  Systolic BP 126 139 123  Diastolic BP 75 99 83  Wt. (Lbs) 236.8 228 228  BMI 34.97 33.67 36.8      Physical Exam  Constitutional: She is oriented to person, place, and time.  Well developed, obese, NAD, polite  HENT:  Head: Normocephalic and atraumatic.  Eyes: No scleral icterus.  Neck: Normal range of motion. Neck supple. No thyromegaly present.  Cardiovascular: Normal rate, regular  rhythm and normal heart sounds.   Pulmonary/Chest: Effort normal and breath sounds normal.  Musculoskeletal: She exhibits no edema.  Neurological: She is alert and oriented to person, place, and time.  Skin: Skin is warm and dry.  Dermatomal distribution of shingles along T5 on the right side with superimposed pustular suppuration and mild surrounding erythema of the lesion under right breast  Psychiatric: She has a normal mood and affect. Her behavior is normal. Thought content normal.  Vitals reviewed.    Assessment & Plan:    1. Secondary infection of skin - Cephalexin 500 MG tablet; Take 1  tablet (500 mg total) by mouth 3 (three) times daily.  Dispense: 30 tablet; Refill: 0 - WOUND CULTURE - Advised patient to keep wound clean and dry.   2. Herpes zoster with other complication - superimposed bacterial infection  3. Chronic left-sided low back pain with left-sided sciatica - naproxen (NAPROSYN) 500 MG tablet; Take 1 tablet (500 mg total) by mouth 2 (two) times daily with a meal.  Dispense: 30 tablet; Refill: 0  4. Generalized anxiety disorder - sertraline (ZOLOFT) 50 MG tablet; Take 1 tablet (50 mg total) by mouth daily.  Dispense: 30 tablet; Refill: 3   Meds ordered this encounter  Medications  . Cephalexin 500 MG tablet    Sig: Take 1 tablet (500 mg total) by mouth 3 (three) times daily.    Dispense:  30 tablet    Refill:  0    Order Specific Question:   Supervising Provider    Answer:   Quentin Angst [id number]  . naproxen (NAPROSYN) 500 MG tablet    Sig: Take 1 tablet (500 mg total) by mouth 2 (two) times daily with a meal.    Dispense:  30 tablet    Refill:  0    Order Specific Question:   Supervising Provider    Answer:   Quentin Angst [id number]  . sertraline (ZOLOFT) 50 MG tablet    Sig: Take 1 tablet (50 mg total) by mouth daily.    Dispense:  30 tablet    Refill:  3    Order Specific Question:   Supervising Provider    Answer:   Quentin Angst [id number]    Follow-up: Return in about 1 week (around 04/30/2017) for skin infection.   Loletta Specter PA

## 2017-04-26 LAB — WOUND CULTURE

## 2017-04-27 ENCOUNTER — Telehealth (INDEPENDENT_AMBULATORY_CARE_PROVIDER_SITE_OTHER): Payer: Self-pay

## 2017-04-27 NOTE — Telephone Encounter (Signed)
Left message for second time asking patient to call the office.

## 2017-04-27 NOTE — Telephone Encounter (Signed)
-----   Message from Loletta Specter, PA-C sent at 04/25/2017  1:27 PM EDT ----- Please have this patient return ASAP to have a Rocephin injection as there are gram negative diplococci found in the wound culture.

## 2017-04-30 ENCOUNTER — Encounter (INDEPENDENT_AMBULATORY_CARE_PROVIDER_SITE_OTHER): Payer: Self-pay | Admitting: Physician Assistant

## 2017-04-30 ENCOUNTER — Ambulatory Visit (INDEPENDENT_AMBULATORY_CARE_PROVIDER_SITE_OTHER): Payer: Self-pay | Admitting: Physician Assistant

## 2017-04-30 VITALS — BP 132/87 | HR 82 | Temp 97.9°F | Wt 235.6 lb

## 2017-04-30 DIAGNOSIS — L03818 Cellulitis of other sites: Secondary | ICD-10-CM

## 2017-04-30 MED ORDER — CEFTRIAXONE SODIUM 250 MG IJ SOLR
250.0000 mg | Freq: Once | INTRAMUSCULAR | Status: AC
  Administered 2017-04-30: 250 mg via INTRAMUSCULAR

## 2017-04-30 MED ORDER — AZITHROMYCIN 500 MG PO TABS: 1000.0000 mg | ORAL_TABLET | Freq: Once | ORAL | 0 refills | Status: AC

## 2017-04-30 MED ORDER — CEFTRIAXONE SODIUM 500 MG IJ SOLR: 500.0000 mg | Freq: Once | INTRAMUSCULAR | Status: DC

## 2017-04-30 MED ORDER — CEFTRIAXONE SODIUM 500 MG IJ SOLR: 500.0000 mg | Freq: Once | INTRAMUSCULAR | 0 refills | Status: DC

## 2017-04-30 NOTE — Progress Notes (Signed)
Subjective:  Patient ID: MAITLYN PENZA, female    DOB: Jul 12, 1961  Age: 56 y.o. MRN: [id number]  CC: f/u skin infection  HPI GRAYCEN DEGAN is a 56 y.o. female with a medical history of anxiety and left sided lower back pain presents to f/u on the secondary infection under right breast. Shingles was the primary infection but skin breakage caused bacterial infection confirmed by wound culture.  Many GBS, gram positive cocci and many gram negative diploccoci. Has been taking cephalexin 500 mg TID with good results but has noticed there are small areas that are not healing. Does not endorse any constitutional symptoms.        Outpatient Medications Prior to Visit  Medication Sig Dispense Refill  . Cephalexin 500 MG tablet Take 1 tablet (500 mg total) by mouth 3 (three) times daily. 30 tablet 0  . clonazePAM (KLONOPIN) 0.5 MG tablet Take 1 tablet (0.5 mg total) by mouth 2 (two) times daily as needed for anxiety. 30 tablet 0  . diphenhydrAMINE (BENADRYL) 25 mg capsule Take 1 capsule (25 mg total) by mouth every 6 (six) hours as needed for itching. (Patient not taking: Reported on 04/23/2017) 30 capsule 0  . famotidine (PEPCID) 20 MG tablet Take 1 tablet (20 mg total) by mouth 2 (two) times daily. (Patient not taking: Reported on 04/23/2017) 30 tablet 0  . gabapentin (NEURONTIN) 300 MG capsule Take 300 mg by mouth on day 1, increased to 300 mg twice a day on day 2, then take 300 mg 3 times a day 90 capsule 1  . naproxen (NAPROSYN) 500 MG tablet Take 1 tablet (500 mg total) by mouth 2 (two) times daily with a meal. 30 tablet 0  . sertraline (ZOLOFT) 50 MG tablet Take 1 tablet (50 mg total) by mouth daily. 30 tablet 3  . traMADol (ULTRAM) 50 MG tablet Take 1 tablet (50 mg total) by mouth every 6 (six) hours as needed. 15 tablet 0  . valACYclovir (VALTREX) 1000 MG tablet Take 1 tablet (1,000 mg total) by mouth 3 (three) times daily. 21 tablet 0   No facility-administered medications prior to  visit.      ROS Review of Systems  Constitutional: Negative for chills, fever and malaise/fatigue.  Eyes: Negative for blurred vision.  Respiratory: Negative for shortness of breath.   Cardiovascular: Negative for chest pain and palpitations.  Gastrointestinal: Negative for abdominal pain and nausea.  Genitourinary: Negative for dysuria and hematuria.  Musculoskeletal: Negative for joint pain and myalgias.  Skin: Negative for rash.  Neurological: Negative for tingling and headaches.  Psychiatric/Behavioral: Negative for depression. The patient is not nervous/anxious.     Objective:  BP 132/87 (BP Location: Left Arm, Patient Position: Sitting, Cuff Size: Large)   Pulse 82   Temp 97.9 F (36.6 C) (Oral)   Wt 235 lb 9.6 oz (106.9 kg)   SpO2 99%   BMI 34.79 kg/m   BP/Weight 04/30/2017 04/23/2017 04/16/2017  Systolic BP 132 126 139  Diastolic BP 87 75 99  Wt. (Lbs) 235.6 236.8 228  BMI 34.79 34.97 33.67      Physical Exam  Constitutional: She is oriented to person, place, and time.  Well developed, well nourished, NAD, polite  HENT:  Head: Normocephalic and atraumatic.  Eyes: No scleral icterus.  Cardiovascular: Regular rhythm.   Pulmonary/Chest: Effort normal and breath sounds normal.  Neurological: She is alert and oriented to person, place, and time. No cranial nerve deficit. Coordination normal.  Skin: Skin  is warm and dry.  Two patches of moist erosion under right breast. Rest of shingles lesions are healing and drying.   Psychiatric: She has a normal mood and affect. Her behavior is normal. Thought content normal.  Vitals reviewed.    Assessment & Plan:     1. Cellulitis of other specified site - Gram negative diplococci found on wound culture. - Administered cefTRIAXone (ROCEPHIN) 500 MG injection; Inject 500 mg into the muscle once. For IM use in large muscle mass  Dispense: 1 each; Refill: 0 - Administered azithromycin (ZITHROMAX) 500 MG tablet; Take 2  tablets (1,000 mg total) by mouth once.  Dispense: 2 tablet; Refill: 0 - Continue on cephalexin for gram positive coverage.    Meds ordered this encounter  Medications  . cefTRIAXone (ROCEPHIN) 500 MG injection    Sig: Inject 500 mg into the muscle once. For IM use in large muscle mass    Dispense:  1 each    Refill:  0    Order Specific Question:   Supervising Provider    Answer:   Quentin Angst [id number]  . azithromycin (ZITHROMAX) 500 MG tablet    Sig: Take 2 tablets (1,000 mg total) by mouth once.    Dispense:  2 tablet    Refill:  0    Order Specific Question:   Supervising Provider    Answer:   Quentin Angst [id number]    Follow-up: Return if symptoms worsen or fail to improve.   Loletta Specter PA

## 2017-04-30 NOTE — Patient Instructions (Signed)

## 2019-09-29 ENCOUNTER — Ambulatory Visit: Payer: BLUE CROSS/BLUE SHIELD | Attending: Nurse Practitioner | Admitting: Nurse Practitioner

## 2019-09-29 ENCOUNTER — Encounter: Payer: Self-pay | Admitting: Nurse Practitioner

## 2019-09-29 ENCOUNTER — Other Ambulatory Visit: Payer: Self-pay

## 2019-09-29 VITALS — Ht 67.0 in

## 2019-09-29 DIAGNOSIS — R7303 Prediabetes: Secondary | ICD-10-CM

## 2019-09-29 DIAGNOSIS — Z1159 Encounter for screening for other viral diseases: Secondary | ICD-10-CM

## 2019-09-29 DIAGNOSIS — Z1211 Encounter for screening for malignant neoplasm of colon: Secondary | ICD-10-CM

## 2019-09-29 DIAGNOSIS — Z130 Encounter for screening for diseases of the blood and blood-forming organs and certain disorders involving the immune mechanism: Secondary | ICD-10-CM

## 2019-09-29 DIAGNOSIS — Z7689 Persons encountering health services in other specified circumstances: Secondary | ICD-10-CM

## 2019-09-29 DIAGNOSIS — Z1231 Encounter for screening mammogram for malignant neoplasm of breast: Secondary | ICD-10-CM

## 2019-09-29 DIAGNOSIS — E785 Hyperlipidemia, unspecified: Secondary | ICD-10-CM

## 2019-09-29 NOTE — Progress Notes (Signed)
Virtual Visit via Telephone Note Due to national recommendations of social distancing due to [place] 19, telehealth visit is felt to be most appropriate for this patient at this time.  I discussed the limitations, risks, security and privacy concerns of performing an evaluation and management service by telephone and the availability of in person appointments. I also discussed with the patient that there may be a patient responsible charge related to this service. The patient expressed understanding and agreed to proceed.    I connected with Leah Hernandez on 09/29/19  at   1:50 PM EST  EDT by telephone and verified that I am speaking with the correct person using two identifiers.   Consent I discussed the limitations, risks, security and privacy concerns of performing an evaluation and management service by telephone and the availability of in person appointments. I also discussed with the patient that there may be a patient responsible charge related to this service. The patient expressed understanding and agreed to proceed.   Location of Patient: Private Residence    Location of Provider: [place] and [employer] Office    Persons participating in Telemedicine visit: Leah Rankins FNP-BC [place]    History of Present Illness: Telemedicine visit for: [place] maintenance Mammogram: referred to breast center today Colonscopy: referred to gastroenterology PAP Smear : will schedule PAP at next office visit.  Prediabetes Currently diet controlled. She does not monitor her blood glucose levels at home. Denies any hypo or hyperglycemic symptoms.  Lab Results  Component Value Date   HGBA1C 5.7 03/27/2017   OSA Has cough and shortness of breath at night. Has never been tested for sleep apnea. Will need sleep study. She is not a former smoker and denies ever being diagnosed with Asthma or COPD.   Past Medical History:  Diagnosis Date   . Prediabetes     Past Surgical History:  Procedure Laterality Date  . TUBAL LIGATION      Family History  Problem Relation Age of Onset  . Hypertension Neg Hx     Social History   Socioeconomic History  . Marital status: Married    Spouse name: Not on file  . Number of children: Not on file  . Years of education: Not on file  . Highest education level: Not on file  Occupational History  . Not on file  Tobacco Use  . Smoking status: Never Smoker  . Smokeless tobacco: Never Used  Substance and Sexual Activity  . Alcohol use: No  . Drug use: No  . Sexual activity: Yes    Birth control/protection: Surgical  Other Topics Concern  . Not on file  Social History Narrative  . Not on file   Social Determinants of Health   Financial Resource Strain:   . Difficulty of Paying Living Expenses: Not on file  Food Insecurity:   . Worried About Charity fundraiser in the Last Year: Not on file  . Ran Out of Food in the Last Year: Not on file  Transportation Needs:   . Lack of Transportation (Medical): Not on file  . Lack of Transportation (Non-Medical): Not on file  Physical Activity:   . Days of Exercise per Week: Not on file  . Minutes of Exercise per Session: Not on file  Stress:   . Feeling of Stress : Not on file  Social Connections:   . Frequency of Communication with Friends and Family: Not on file  .  Frequency of Social Gatherings with Friends and Family: Not on file  . Attends Religious Services: Not on file  . Active Member of Clubs or Organizations: Not on file  . Attends Archivist Meetings: Not on file  . Marital Status: Not on file     Observations/Objective: Awake, alert and oriented x 3   Review of Systems  Constitutional: Negative for fever, malaise/fatigue and weight loss.  HENT: Negative.  Negative for nosebleeds.   Eyes: Negative.  Negative for blurred vision, double vision and photophobia.  Respiratory: Positive for cough and shortness  of breath.        SEE HPI  Cardiovascular: Negative.  Negative for chest pain, palpitations and leg swelling.  Gastrointestinal: Negative.  Negative for heartburn, nausea and vomiting.  Musculoskeletal: Negative.  Negative for myalgias.  Neurological: Negative.  Negative for dizziness, focal weakness, seizures and headaches.  Psychiatric/Behavioral: Negative.  Negative for suicidal ideas.    Assessment and Plan: Samanthia was seen today for establish care.  Diagnoses and all orders for this visit:  Encounter to establish care  Encounter for hepatitis C screening test for low risk patient -     Hepatitis C Antibody  Prediabetes -     CMP14+EGFR -     Hemoglobin A1c  Breast cancer screening by mammogram -     MM 3D SCREEN BREAST BILATERAL; Future  Colon cancer screening -     Ambulatory referral to Gastroenterology  Dyslipidemia, goal LDL below 100 -     Lipid panel  Screening for deficiency anemia -     CBC     Follow Up Instructions Return for PAP SMEAR.     I discussed the assessment and treatment plan with the patient. The patient was provided an opportunity to ask questions and all were answered. The patient agreed with the plan and demonstrated an understanding of the instructions.   The patient was advised to call back or seek an in-person evaluation if the symptoms worsen or if the condition fails to improve as anticipated.  I provided 16 minutes of non-face-to-face time during this encounter including median intraservice time, reviewing previous notes, labs, imaging, medications and explaining diagnosis and management.  Gildardo Pounds, FNP-BC

## 2019-09-30 LAB — CBC
Hematocrit: 38.8 % (ref 34.0–46.6)
Hemoglobin: 12.6 g/dL (ref 11.1–15.9)
MCH: 29.6 pg (ref 26.6–33.0)
MCHC: 32.5 g/dL (ref 31.5–35.7)
MCV: 91 fL (ref 79–97)
Platelets: 262 10*3/uL (ref 150–450)
RBC: 4.25 x10E6/uL (ref 3.77–5.28)
RDW: 13 % (ref 11.7–15.4)
WBC: 5.5 10*3/uL (ref 3.4–10.8)

## 2019-09-30 LAB — CMP14+EGFR
ALT: 16 IU/L (ref 0–32)
AST: 26 IU/L (ref 0–40)
Albumin/Globulin Ratio: 1.5 (ref 1.2–2.2)
Albumin: 4.6 g/dL (ref 3.8–4.9)
Alkaline Phosphatase: 80 IU/L (ref 39–117)
BUN/Creatinine Ratio: 17 (ref 9–23)
BUN: 17 mg/dL (ref 6–24)
Bilirubin Total: 0.3 mg/dL (ref 0.0–1.2)
CO2: 23 mmol/L (ref 20–29)
Calcium: 9.6 mg/dL (ref 8.7–10.2)
Chloride: 104 mmol/L (ref 96–106)
Creatinine, Ser: 1.02 mg/dL — ABNORMAL HIGH (ref 0.57–1.00)
GFR calc Af Amer: 70 mL/min/{1.73_m2} (ref >59)
GFR calc non Af Amer: 61 mL/min/{1.73_m2} (ref >59)
Globulin, Total: 3 g/dL (ref 1.5–4.5)
Glucose: 92 mg/dL (ref 65–99)
Potassium: 5.3 mmol/L — ABNORMAL HIGH (ref 3.5–5.2)
Sodium: 140 mmol/L (ref 134–144)
Total Protein: 7.6 g/dL (ref 6.0–8.5)

## 2019-09-30 LAB — LIPID PANEL
Chol/HDL Ratio: 1.8 ratio (ref 0.0–4.4)
Cholesterol, Total: 210 mg/dL — ABNORMAL HIGH (ref 100–199)
HDL: 115 mg/dL (ref >39)
LDL Chol Calc (NIH): 81 mg/dL (ref 0–99)
Triglycerides: 81 mg/dL (ref 0–149)
VLDL Cholesterol Cal: 14 mg/dL (ref 5–40)

## 2019-09-30 LAB — HEMOGLOBIN A1C
Est. average glucose Bld gHb Est-mCnc: 117 mg/dL
Hgb A1c MFr Bld: 5.7 % — ABNORMAL HIGH (ref 4.8–5.6)

## 2019-09-30 LAB — HEPATITIS C ANTIBODY: Hep C Virus Ab: <0.1 s/co ratio (ref 0.0–0.9)

## 2019-10-23 ENCOUNTER — Encounter: Payer: BLUE CROSS/BLUE SHIELD | Admitting: Gastroenterology

## 2019-10-29 ENCOUNTER — Other Ambulatory Visit: Payer: Self-pay

## 2019-10-29 ENCOUNTER — Ambulatory Visit: Payer: BLUE CROSS/BLUE SHIELD

## 2019-11-13 ENCOUNTER — Ambulatory Visit: Payer: BLUE CROSS/BLUE SHIELD | Attending: Internal Medicine

## 2019-11-13 DIAGNOSIS — Z23 Encounter for immunization: Secondary | ICD-10-CM

## 2019-11-13 NOTE — Progress Notes (Signed)
   Covid-19 Vaccination Clinic  Name:  AZRIELLE SPRINGSTEEN    MRN: [id number] DOB: 1961/02/22  11/13/2019  Ms. Langone was observed post Covid-19 immunization for 15 minutes without incident. She was provided with Vaccine Information Sheet and instruction to access the V-Safe system.   Ms. Benn was instructed to call 911 with any severe reactions post vaccine: Marland Kitchen Difficulty breathing  . Swelling of face and throat  . A fast heartbeat  . A bad rash all over body  . Dizziness and weakness   Immunizations Administered    Name Date Dose VIS Date Route   Pfizer COVID-19 Vaccine 11/13/2019  8:24 AM 0.3 mL 07/11/2019 Intramuscular   Manufacturer: [employer], [employer]   Lot: [id number]   NDC: [address]-0

## 2019-12-08 ENCOUNTER — Ambulatory Visit: Payer: BLUE CROSS/BLUE SHIELD | Attending: Internal Medicine

## 2019-12-08 DIAGNOSIS — Z23 Encounter for immunization: Secondary | ICD-10-CM

## 2019-12-08 NOTE — Progress Notes (Signed)
   Covid-19 Vaccination Clinic  Name:  [name]    MRN: [id number] DOB: [birth year]  12/08/2019  [name] was observed post Covid-19 immunization for 15 minutes without incident. She was provided with Vaccine Information Sheet and instruction to access the V-Safe system.   [name] was instructed to call 911 with any severe reactions post vaccine: Marland Kitchen Difficulty breathing  . Swelling of face and throat  . A fast heartbeat  . A bad rash all over body  . Dizziness and weakness   Immunizations Administered    Name Date Dose VIS Date Route   Pfizer COVID-19 Vaccine 12/08/2019  8:12 AM 0.3 mL 09/24/2018 Intramuscular   Manufacturer: [employer], [employer]   Lot: [id number]   NDC: [address]-3

## 2023-12-12 ENCOUNTER — Other Ambulatory Visit: Payer: Self-pay

## 2023-12-12 ENCOUNTER — Encounter (HOSPITAL_COMMUNITY): Payer: Self-pay | Admitting: *Deleted

## 2023-12-12 ENCOUNTER — Inpatient Hospital Stay (HOSPITAL_COMMUNITY)
Admission: EM | Admit: 2023-12-12 | Discharge: 2023-12-22 | DRG: 329 | Disposition: A | Discharge status: Home / Self Care | Attending: Internal Medicine | Admitting: Internal Medicine

## 2023-12-12 DIAGNOSIS — D500 Iron deficiency anemia secondary to blood loss (chronic): Secondary | ICD-10-CM | POA: Diagnosis present

## 2023-12-12 DIAGNOSIS — R42 Dizziness and giddiness: Secondary | ICD-10-CM | POA: Diagnosis not present

## 2023-12-12 DIAGNOSIS — C183 Malignant neoplasm of hepatic flexure: Secondary | ICD-10-CM | POA: Diagnosis present

## 2023-12-12 DIAGNOSIS — I5021 Acute systolic (congestive) heart failure: Secondary | ICD-10-CM | POA: Diagnosis present

## 2023-12-12 DIAGNOSIS — D509 Iron deficiency anemia, unspecified: Secondary | ICD-10-CM | POA: Diagnosis not present

## 2023-12-12 DIAGNOSIS — E538 Deficiency of other specified B group vitamins: Secondary | ICD-10-CM | POA: Diagnosis not present

## 2023-12-12 DIAGNOSIS — C184 Malignant neoplasm of transverse colon: Secondary | ICD-10-CM | POA: Diagnosis not present

## 2023-12-12 DIAGNOSIS — C182 Malignant neoplasm of ascending colon: Secondary | ICD-10-CM | POA: Diagnosis present

## 2023-12-12 DIAGNOSIS — Z87891 Personal history of nicotine dependence: Secondary | ICD-10-CM

## 2023-12-12 DIAGNOSIS — R5383 Other fatigue: Secondary | ICD-10-CM | POA: Diagnosis present

## 2023-12-12 DIAGNOSIS — Y838 Other surgical procedures as the cause of abnormal reaction of the patient, or of later complication, without mention of misadventure at the time of the procedure: Secondary | ICD-10-CM | POA: Diagnosis not present

## 2023-12-12 DIAGNOSIS — D649 Anemia, unspecified: Secondary | ICD-10-CM | POA: Diagnosis not present

## 2023-12-12 DIAGNOSIS — E66812 Obesity, class 2: Secondary | ICD-10-CM | POA: Diagnosis not present

## 2023-12-12 DIAGNOSIS — R7303 Prediabetes: Secondary | ICD-10-CM | POA: Diagnosis present

## 2023-12-12 DIAGNOSIS — R0609 Other forms of dyspnea: Secondary | ICD-10-CM | POA: Diagnosis not present

## 2023-12-12 DIAGNOSIS — K5730 Diverticulosis of large intestine without perforation or abscess without bleeding: Secondary | ICD-10-CM | POA: Diagnosis present

## 2023-12-12 DIAGNOSIS — I420 Dilated cardiomyopathy: Secondary | ICD-10-CM | POA: Diagnosis present

## 2023-12-12 DIAGNOSIS — I429 Cardiomyopathy, unspecified: Secondary | ICD-10-CM | POA: Diagnosis not present

## 2023-12-12 DIAGNOSIS — I472 Ventricular tachycardia, unspecified: Secondary | ICD-10-CM | POA: Diagnosis not present

## 2023-12-12 DIAGNOSIS — D630 Anemia in neoplastic disease: Secondary | ICD-10-CM | POA: Diagnosis not present

## 2023-12-12 DIAGNOSIS — K648 Other hemorrhoids: Secondary | ICD-10-CM | POA: Diagnosis not present

## 2023-12-12 DIAGNOSIS — D490 Neoplasm of unspecified behavior of digestive system: Secondary | ICD-10-CM | POA: Diagnosis not present

## 2023-12-12 DIAGNOSIS — R630 Anorexia: Secondary | ICD-10-CM | POA: Diagnosis present

## 2023-12-12 DIAGNOSIS — N179 Acute kidney failure, unspecified: Secondary | ICD-10-CM | POA: Diagnosis present

## 2023-12-12 DIAGNOSIS — I428 Other cardiomyopathies: Secondary | ICD-10-CM

## 2023-12-12 DIAGNOSIS — K2950 Unspecified chronic gastritis without bleeding: Secondary | ICD-10-CM | POA: Diagnosis not present

## 2023-12-12 DIAGNOSIS — Z6836 Body mass index (BMI) 36.0-36.9, adult: Secondary | ICD-10-CM

## 2023-12-12 DIAGNOSIS — K5669 Other partial intestinal obstruction: Secondary | ICD-10-CM | POA: Diagnosis not present

## 2023-12-12 DIAGNOSIS — C189 Malignant neoplasm of colon, unspecified: Secondary | ICD-10-CM

## 2023-12-12 DIAGNOSIS — D128 Benign neoplasm of rectum: Secondary | ICD-10-CM | POA: Diagnosis present

## 2023-12-12 DIAGNOSIS — R55 Syncope and collapse: Secondary | ICD-10-CM | POA: Diagnosis present

## 2023-12-12 DIAGNOSIS — S35331A Laceration of superior mesenteric vein, initial encounter: Secondary | ICD-10-CM | POA: Diagnosis not present

## 2023-12-12 DIAGNOSIS — K5909 Other constipation: Secondary | ICD-10-CM | POA: Diagnosis not present

## 2023-12-12 DIAGNOSIS — K5900 Constipation, unspecified: Secondary | ICD-10-CM | POA: Diagnosis not present

## 2023-12-12 DIAGNOSIS — R634 Abnormal weight loss: Secondary | ICD-10-CM | POA: Diagnosis not present

## 2023-12-12 DIAGNOSIS — K219 Gastro-esophageal reflux disease without esophagitis: Secondary | ICD-10-CM | POA: Diagnosis present

## 2023-12-12 DIAGNOSIS — D122 Benign neoplasm of ascending colon: Secondary | ICD-10-CM | POA: Diagnosis not present

## 2023-12-12 DIAGNOSIS — Z6831 Body mass index (BMI) 31.0-31.9, adult: Secondary | ICD-10-CM | POA: Diagnosis not present

## 2023-12-12 DIAGNOSIS — E43 Unspecified severe protein-calorie malnutrition: Secondary | ICD-10-CM | POA: Diagnosis not present

## 2023-12-12 DIAGNOSIS — K6389 Other specified diseases of intestine: Secondary | ICD-10-CM | POA: Diagnosis not present

## 2023-12-12 DIAGNOSIS — K2970 Gastritis, unspecified, without bleeding: Secondary | ICD-10-CM | POA: Diagnosis present

## 2023-12-12 DIAGNOSIS — Z8249 Family history of ischemic heart disease and other diseases of the circulatory system: Secondary | ICD-10-CM

## 2023-12-12 DIAGNOSIS — K635 Polyp of colon: Secondary | ICD-10-CM

## 2023-12-12 DIAGNOSIS — R000 Tachycardia, unspecified: Secondary | ICD-10-CM | POA: Diagnosis not present

## 2023-12-12 DIAGNOSIS — K449 Diaphragmatic hernia without obstruction or gangrene: Secondary | ICD-10-CM | POA: Diagnosis not present

## 2023-12-12 DIAGNOSIS — R519 Headache, unspecified: Secondary | ICD-10-CM | POA: Diagnosis not present

## 2023-12-12 DIAGNOSIS — N83202 Unspecified ovarian cyst, left side: Secondary | ICD-10-CM | POA: Diagnosis present

## 2023-12-12 DIAGNOSIS — Z9851 Tubal ligation status: Secondary | ICD-10-CM

## 2023-12-12 DIAGNOSIS — K56600 Partial intestinal obstruction, unspecified as to cause: Secondary | ICD-10-CM | POA: Diagnosis not present

## 2023-12-12 DIAGNOSIS — K625 Hemorrhage of anus and rectum: Secondary | ICD-10-CM | POA: Diagnosis not present

## 2023-12-12 LAB — BASIC METABOLIC PANEL WITH GFR
Anion gap: 8 (ref 5–15)
BUN: 14 mg/dL (ref 8–23)
CO2: 20 mmol/L — ABNORMAL LOW (ref 22–32)
Calcium: 8.6 mg/dL — ABNORMAL LOW (ref 8.9–10.3)
Chloride: 104 mmol/L (ref 98–111)
Creatinine, Ser: 1.08 mg/dL — ABNORMAL HIGH (ref 0.44–1.00)
GFR, Estimated: 58 mL/min — ABNORMAL LOW (ref >60)
Glucose, Bld: 105 mg/dL — ABNORMAL HIGH (ref 70–99)
Potassium: 3.8 mmol/L (ref 3.5–5.1)
Sodium: 132 mmol/L — ABNORMAL LOW (ref 135–145)

## 2023-12-12 LAB — IRON AND TIBC
Iron: 10 ug/dL — ABNORMAL LOW (ref 28–170)
Saturation Ratios: 3 % — ABNORMAL LOW (ref 10.4–31.8)
TIBC: 379 ug/dL (ref 250–450)
UIBC: 369 ug/dL

## 2023-12-12 LAB — COMPREHENSIVE METABOLIC PANEL WITH GFR
ALT: 11 U/L (ref 0–44)
AST: 21 U/L (ref 15–41)
Albumin: 2.8 g/dL — ABNORMAL LOW (ref 3.5–5.0)
Alkaline Phosphatase: 82 U/L (ref 38–126)
Anion gap: 8 (ref 5–15)
BUN: 12 mg/dL (ref 8–23)
CO2: 20 mmol/L — ABNORMAL LOW (ref 22–32)
Calcium: 8.5 mg/dL — ABNORMAL LOW (ref 8.9–10.3)
Chloride: 105 mmol/L (ref 98–111)
Creatinine, Ser: 1.08 mg/dL — ABNORMAL HIGH (ref 0.44–1.00)
GFR, Estimated: 58 mL/min — ABNORMAL LOW (ref >60)
Glucose, Bld: 102 mg/dL — ABNORMAL HIGH (ref 70–99)
Potassium: 3.7 mmol/L (ref 3.5–5.1)
Sodium: 133 mmol/L — ABNORMAL LOW (ref 135–145)
Total Bilirubin: 0.5 mg/dL (ref 0.0–1.2)
Total Protein: 6.8 g/dL (ref 6.5–8.1)

## 2023-12-12 LAB — FOLATE: Folate: 10 ng/mL (ref >5.9)

## 2023-12-12 LAB — URINALYSIS, ROUTINE W REFLEX MICROSCOPIC
Bilirubin Urine: NEGATIVE
Glucose, UA: NEGATIVE mg/dL
Hgb urine dipstick: NEGATIVE
Ketones, ur: NEGATIVE mg/dL
Nitrite: NEGATIVE
Protein, ur: 30 mg/dL — AB
Specific Gravity, Urine: 1.013 (ref 1.005–1.030)
pH: 5 (ref 5.0–8.0)

## 2023-12-12 LAB — CBC WITH DIFFERENTIAL/PLATELET
Abs Immature Granulocytes: 0.03 10*3/uL (ref 0.00–0.07)
Basophils Absolute: 0 10*3/uL (ref 0.0–0.1)
Basophils Relative: 0 %
Eosinophils Absolute: 0.2 10*3/uL (ref 0.0–0.5)
Eosinophils Relative: 3 %
HCT: 18.7 % — ABNORMAL LOW (ref 36.0–46.0)
Hemoglobin: 5.1 g/dL — CL (ref 12.0–15.0)
Immature Granulocytes: 1 %
Lymphocytes Relative: 30 %
Lymphs Abs: 1.9 10*3/uL (ref 0.7–4.0)
MCH: 17.1 pg — ABNORMAL LOW (ref 26.0–34.0)
MCHC: 27.3 g/dL — ABNORMAL LOW (ref 30.0–36.0)
MCV: 62.5 fL — ABNORMAL LOW (ref 80.0–100.0)
Monocytes Absolute: 0.4 10*3/uL (ref 0.1–1.0)
Monocytes Relative: 7 %
Neutro Abs: 3.7 10*3/uL (ref 1.7–7.7)
Neutrophils Relative %: 59 %
Platelets: 512 10*3/uL — ABNORMAL HIGH (ref 150–400)
RBC: 2.99 MIL/uL — ABNORMAL LOW (ref 3.87–5.11)
RDW: 20 % — ABNORMAL HIGH (ref 11.5–15.5)
WBC: 6.3 10*3/uL (ref 4.0–10.5)
nRBC: 0 % (ref 0.0–0.2)

## 2023-12-12 LAB — RETICULOCYTES
Immature Retic Fract: 34.8 % — ABNORMAL HIGH (ref 2.3–15.9)
RBC.: 2.81 MIL/uL — ABNORMAL LOW (ref 3.87–5.11)
Retic Count, Absolute: 33.4 10*3/uL (ref 19.0–186.0)
Retic Ct Pct: 1.2 % (ref 0.4–3.1)

## 2023-12-12 LAB — POC OCCULT BLOOD, ED: Fecal Occult Bld: NEGATIVE

## 2023-12-12 LAB — ABO/RH: ABO/RH(D): A POS

## 2023-12-12 LAB — VITAMIN B12: Vitamin B-12: 250 pg/mL (ref 180–914)

## 2023-12-12 LAB — FERRITIN: Ferritin: 3 ng/mL — ABNORMAL LOW (ref 11–307)

## 2023-12-12 LAB — TSH: TSH: 3.812 u[IU]/mL (ref 0.350–4.500)

## 2023-12-12 LAB — PREPARE RBC (CROSSMATCH)

## 2023-12-12 MED ORDER — MELATONIN 3 MG PO TABS: 3.0000 mg | ORAL_TABLET | Freq: Every evening | ORAL | Status: DC | PRN

## 2023-12-12 MED ORDER — LACTATED RINGERS IV BOLUS
1000.0000 mL | Freq: Once | INTRAVENOUS | Status: AC
Start: 2023-12-12 — End: 2023-12-13
  Administered 2023-12-12: 1000 mL via INTRAVENOUS

## 2023-12-12 MED ORDER — ONDANSETRON HCL 4 MG/2ML IJ SOLN
4.0000 mg | Freq: Four times a day (QID) | INTRAMUSCULAR | Status: DC | PRN
  Administered 2023-12-17 – 2023-12-21 (×3): 4 mg via INTRAVENOUS
  Filled 2023-12-12 (×3): qty 2

## 2023-12-12 MED ORDER — SODIUM CHLORIDE 0.9% IV SOLUTION: Freq: Once | INTRAVENOUS | Status: AC

## 2023-12-12 MED ORDER — ONDANSETRON HCL 4 MG PO TABS
4.0000 mg | ORAL_TABLET | Freq: Four times a day (QID) | ORAL | Status: DC | PRN
  Administered 2023-12-13: 4 mg via ORAL
  Filled 2023-12-12: qty 1

## 2023-12-12 NOTE — ED Triage Notes (Signed)
 The pt almost fainted yesterday and today she was seen at [facility]   and sent here with a low hgb  no hx of anemia or dark stools  she has had some dizziness

## 2023-12-12 NOTE — ED Provider Notes (Signed)
 [place] EMERGENCY DEPARTMENT AT [facility] Provider Note   CSN: [id number] Arrival date & time: 12/12/23  1528     History  Chief Complaint  Patient presents with   fainted yesterday    Leah Hernandez is a 63 y.o. female.  Patient is a 63 year old female with no significant past medical history who is currently on no medications and denies any drug or alcohol use presenting today with a few month history of gradual fatigue, weakness, feeling slightly winded with exertion and more recently in the last few weeks feeling dizzy and near syncope at the grocery store yesterday.  Patient followed up at urgent care today because of the symptoms and had blood work done and was told her hemoglobin was low and she should come to the hospital.  She denies any current shortness of breath or chest pain.  She has had no abdominal pain.  Does report a history of constipation and she will occasionally have a little bit of bright red blood in her stool related to hemorrhoids but no frank blood.  No hematemesis.  No vaginal bleeding.  No prior history of anemia requiring blood transfusion.  She denies NSAID or aspirin use regularly.  She does report getting a lot of GERD but does not take anything but Tums for it on occasion.  The history is provided by the patient.       Home Medications Prior to Admission medications   Not on File      Allergies    Patient has no known allergies.    Review of Systems   Review of Systems  Physical Exam Updated Vital Signs BP 124/81 (BP Location: Right Arm)   Pulse (!) 120   Temp 98.1 F (36.7 C)   Resp 18   Ht 5\' 7"  (1.702 m)   Wt 106.9 kg   SpO2 100%   BMI 36.91 kg/m  Physical Exam Vitals and nursing note reviewed.  Constitutional:      General: She is not in acute distress.    Appearance: She is well-developed.  HENT:     Head: Normocephalic and atraumatic.  Eyes:     Pupils: Pupils are equal, round, and reactive to light.   Cardiovascular:     Rate and Rhythm: Regular rhythm. Tachycardia present.     Heart sounds: Murmur heard.     No friction rub.  Pulmonary:     Effort: Pulmonary effort is normal.     Breath sounds: Normal breath sounds. No wheezing or rales.  Abdominal:     General: Bowel sounds are normal. There is no distension.     Palpations: Abdomen is soft.     Tenderness: There is no abdominal tenderness. There is no guarding or rebound.  Genitourinary:    Rectum: Normal. Guaiac result negative.  Musculoskeletal:        General: No tenderness. Normal range of motion.     Cervical back: Normal range of motion and neck supple. No tenderness.     Right lower leg: No edema.     Left lower leg: No edema.     Comments: No edema  Skin:    General: Skin is warm and dry.     Coloration: Skin is pale.     Findings: No rash.  Neurological:     Mental Status: She is alert and oriented to person, place, and time.     Cranial Nerves: No cranial nerve deficit.  Psychiatric:  Behavior: Behavior normal.     ED Results / Procedures / Treatments   Labs (all labs ordered are listed, but only abnormal results are displayed) Labs Reviewed  BASIC METABOLIC PANEL WITH GFR - Abnormal; Notable for the following components:      Result Value   Sodium 132 (*)    CO2 20 (*)    Glucose, Bld 105 (*)    Creatinine, Ser 1.08 (*)    Calcium 8.6 (*)    GFR, Estimated 58 (*)    All other components within normal limits  CBC WITH DIFFERENTIAL/PLATELET - Abnormal; Notable for the following components:   RBC 2.99 (*)    Hemoglobin 5.1 (*)    HCT 18.7 (*)    MCV 62.5 (*)    MCH 17.1 (*)    MCHC 27.3 (*)    RDW 20.0 (*)    Platelets 512 (*)    All other components within normal limits  URINALYSIS, ROUTINE W REFLEX MICROSCOPIC - Abnormal; Notable for the following components:   APPearance HAZY (*)    Protein, ur 30 (*)    Leukocytes,Ua LARGE (*)    Bacteria, UA RARE (*)    Non Squamous Epithelial 0-5  (*)    All other components within normal limits  VITAMIN B12  FOLATE  IRON AND TIBC  FERRITIN  RETICULOCYTES  POC OCCULT BLOOD, ED  TYPE AND SCREEN  PREPARE RBC (CROSSMATCH)  ABO/RH    EKG EKG Interpretation Date/Time:  Wednesday Dec 12 2023 17:14:09 EDT Ventricular Rate:  123 PR Interval:  120 QRS Duration:  72 QT Interval:  318 QTC Calculation: 455 R Axis:   61  Text Interpretation: Sinus tachycardia Otherwise normal ECG No previous ECGs available Confirmed by Almond Army ([id number]) on 12/12/2023 5:28:13 PM  Radiology No results found.  Procedures Procedures    Medications Ordered in ED Medications  0.9 %  sodium chloride infusion (Manually program via Guardrails IV Fluids) (has no administration in time range)    ED Course/ Medical Decision Making/ A&P                                 Medical Decision Making Amount and/or Complexity of Data Reviewed External Data Reviewed: notes. Labs: ordered. Decision-making details documented in ED Course. ECG/medicine tests: ordered and independent interpretation performed. Decision-making details documented in ED Course.  Risk Prescription drug management. Decision regarding hospitalization.   Pt with multiple medical problems and comorbidities and presenting today with a complaint that caries a high risk for morbidity and mortality.  Here today with the above complaints and findings of anemia.  Patient has no significant risk factors.  No prior history of anemia or blood transfusion.  Tachycardic here but otherwise well-appearing.  Blood pressure is normal.  I dependently interpreted patient's EKG and labs.  EKG shows a sinus tachycardia without acute changes otherwise, Hemoccult is negative, BMP without significant findings, CBC with a hemoglobin of 5.1 today which appears to be microcytic.  Platelet count of 512 and normal white count.  Anemia panel was sent.  Discussed transfusion with the patient which she is  agreeable to.  Will admit patient for further care.  2 units of blood was ordered for the patient to be transfused when ready.  Unassigned medicine consult placed for admission.  CRITICAL CARE Performed by: Dollie Mayse Total critical care time: 30 minutes Critical care time was exclusive of separately billable procedures  and treating other patients. Critical care was necessary to treat or prevent imminent or life-threatening deterioration. Critical care was time spent personally by me on the following activities: development of treatment plan with patient and/or surrogate as well as nursing, discussions with consultants, evaluation of patient's response to treatment, examination of patient, obtaining history from patient or surrogate, ordering and performing treatments and interventions, ordering and review of laboratory studies, ordering and review of radiographic studies, pulse oximetry and re-evaluation of patient's condition.        Final Clinical Impression(s) / ED Diagnoses Final diagnoses:  Symptomatic anemia    Rx / DC Orders ED Discharge Orders     None         Almond Army, MD 12/12/23 1824

## 2023-12-12 NOTE — ED Notes (Signed)
 Patient placed on cardiac monitor per RN request.

## 2023-12-12 NOTE — ED Provider Triage Note (Signed)
 Emergency Medicine Provider Triage Evaluation Note  Leah Hernandez , a 63 y.o. female  was evaluated in triage.  Pt complains of abnormal lab at [facility] urgent care, low hemoglobin.  Review of Systems  Positive: Fatigue, dizziness, generalized weakness, dyspnea on exertion Negative: Blood in stool, nausea, vomiting, injury, chest pain  Physical Exam  BP 124/81 (BP Location: Right Arm)   Pulse (!) 120   Temp 98.1 F (36.7 C)   Resp 18   SpO2 100%  Gen:   Awake, no distress   Resp:  Normal effort  MSK:   Moves extremities without difficulty  Other:    Medical Decision Making  Medically screening exam initiated at 4:40 PM.  Appropriate orders placed.  Leah Hernandez was informed that the remainder of the evaluation will be completed by another provider, this initial triage assessment does not replace that evaluation, and the importance of remaining in the ED until their evaluation is complete.  Labs ordered   Carie Charity, PA-C 12/12/23 1610

## 2023-12-12 NOTE — H&P (Signed)
 History and Physical    Patient: Leah Hernandez:[id number] DOB: [birth year] DOA: 12/12/2023 DOS: the patient was seen and examined on 12/12/2023 PCP: Collins Dean, NP  Patient coming from: Home  Chief Complaint:  Chief Complaint  Patient presents with   fainted yesterday   HPI: Leah Hernandez is a 63 y.o. female with no medical history no regular follow-up presented to urgent care this morning for an episode where she almost passed out in the grocery store yesterday. The patient's daughter insisted on her going to urgent care after the episode yesterday.  Blood work was drawn and later the patient was called and told to get to the ER right away. Patient said she just felt very lightheaded and almost collapsed at the grocery store.  She has been feeling weak with occasional lightheadedness for the last 5 months.  She does get short of breath with exertion.  She denies any chest pain or swelling.  She does reports poor appetite and some constipation.  She has not had any significant bleeding.  No nosebleeds or vomiting blood no blood in her urine she has not had any vaginal bleeding.  He is occasional mild streaks of blood which she attributes to hemorrhoids.  He does have mild constipation.  She has not been any accidents or had any cuts.   Review of Systems: As mentioned in the history of present illness. All other systems reviewed and are negative. Past Medical History:  Diagnosis Date   Prediabetes    Past Surgical History:  Procedure Laterality Date   TUBAL LIGATION     Social History:  reports that she has never smoked. She has never used smokeless tobacco. She reports that she does not drink alcohol and does not use drugs.  No Known Allergies  Family History  Problem Relation Age of Onset   Hypertension Neg Hx     Prior to Admission medications   Medication Sig Start Date End Date Taking? Authorizing Provider  acetaminophen  (TYLENOL ) 325 MG tablet Take 325-650  mg by mouth every 6 (six) hours as needed for mild pain (pain score 1-3) or moderate pain (pain score 4-6).   Yes [provider]    Physical Exam: Vitals:   12/12/23 1800 12/12/23 1900 12/12/23 1915 12/12/23 1945  BP: 108/68 111/72 103/70 105/81  Pulse:    (!) 117  Resp:  (!) 22 (!) 21 20  Temp:      SpO2:    100%  Weight:      Height:       Physical Exam:  General: No acute distress, well developed, well nourished HEENT: Normocephalic, atraumatic, PERRL Cardiovascular: Normal rhythm, tachycardia. Distal pulses intact. Pulmonary: Normal pulmonary effort, normal breath sounds Gastrointestinal: Nondistended abdomen, soft, non-tender, normoactive bowel sounds Musculoskeletal:Normal ROM, no lower ext edema Lymphadenopathy: No cervical LAD. Skin: Skin is warm and dry. Neuro: No focal deficits noted, AAOx3. PSYCH: Attentive and cooperative  Data Reviewed:  Results for orders placed or performed during the hospital encounter of 12/12/23 (from the past 24 hours)  Basic metabolic panel     Status: Abnormal   Collection Time: 12/12/23  4:42 PM  Result Value Ref Range   Sodium 132 (L) 135 - 145 mmol/L   Potassium 3.8 3.5 - 5.1 mmol/L   Chloride 104 98 - 111 mmol/L   CO2 20 (L) 22 - 32 mmol/L   Glucose, Bld 105 (H) 70 - 99 mg/dL   BUN 14 8 - 23 mg/dL  Creatinine, Ser 1.08 (H) 0.44 - 1.00 mg/dL   Calcium 8.6 (L) 8.9 - 10.3 mg/dL   GFR, Estimated 58 (L) >60 mL/min   Anion gap 8 5 - 15  CBC with Differential     Status: Abnormal   Collection Time: 12/12/23  4:42 PM  Result Value Ref Range   WBC 6.3 4.0 - 10.5 K/uL   RBC 2.99 (L) 3.87 - 5.11 MIL/uL   Hemoglobin 5.1 (LL) 12.0 - 15.0 g/dL   HCT 16.1 (L) 09.6 - 04.5 %   MCV 62.5 (L) 80.0 - 100.0 fL   MCH 17.1 (L) 26.0 - 34.0 pg   MCHC 27.3 (L) 30.0 - 36.0 g/dL   RDW 40.9 (H) 81.1 - 91.4 %   Platelets 512 (H) 150 - 400 K/uL   nRBC 0.0 0.0 - 0.2 %   Neutrophils Relative % 59 %   Neutro Abs 3.7 1.7 - 7.7 K/uL    Lymphocytes Relative 30 %   Lymphs Abs 1.9 0.7 - 4.0 K/uL   Monocytes Relative 7 %   Monocytes Absolute 0.4 0.1 - 1.0 K/uL   Eosinophils Relative 3 %   Eosinophils Absolute 0.2 0.0 - 0.5 K/uL   Basophils Relative 0 %   Basophils Absolute 0.0 0.0 - 0.1 K/uL   Immature Granulocytes 1 %   Abs Immature Granulocytes 0.03 0.00 - 0.07 K/uL  Urinalysis, Routine w reflex microscopic -Urine, Clean Catch     Status: Abnormal   Collection Time: 12/12/23  4:42 PM  Result Value Ref Range   Color, Urine YELLOW YELLOW   APPearance HAZY (A) CLEAR   Specific Gravity, Urine 1.013 1.005 - 1.030   pH 5.0 5.0 - 8.0   Glucose, UA NEGATIVE NEGATIVE mg/dL   Hgb urine dipstick NEGATIVE NEGATIVE   Bilirubin Urine NEGATIVE NEGATIVE   Ketones, ur NEGATIVE NEGATIVE mg/dL   Protein, ur 30 (A) NEGATIVE mg/dL   Nitrite NEGATIVE NEGATIVE   Leukocytes,Ua LARGE (A) NEGATIVE   RBC / HPF 11-20 0 - 5 RBC/hpf   WBC, UA 21-50 0 - 5 WBC/hpf   Bacteria, UA RARE (A) NONE SEEN   Squamous Epithelial / HPF 0-5 0 - 5 /HPF   Mucus PRESENT    Hyaline Casts, UA PRESENT    Non Squamous Epithelial 0-5 (A) NONE SEEN  Type and screen [facility]     Status: None (Preliminary result)   Collection Time: 12/12/23  4:47 PM  Result Value Ref Range   ABO/RH(D) A POS    Antibody Screen POS    Sample Expiration 12/15/2023,2359    Antibody Identification ANTI M    PT AG Type NEGATIVE FOR M ANTIGEN    DAT, IgG NEG    Unit Number [id number]    Blood Component Type RED CELLS,LR    Unit division 00    Status of Unit ALLOCATED    Transfusion Status OK TO TRANSFUSE    Crossmatch Result COMPATIBLE   POC occult blood, ED     Status: None   Collection Time: 12/12/23  5:47 PM  Result Value Ref Range   Fecal Occult Bld NEGATIVE NEGATIVE  ABO/Rh     Status: None   Collection Time: 12/12/23  6:12 PM  Result Value Ref Range   ABO/RH(D)      A POS Performed at [facility], [address], [place], [place]  [address]   Prepare RBC (crossmatch)     Status: None   Collection Time:  12/12/23  6:30 PM  Result Value Ref Range   Order Confirmation      ORDER PROCESSED BY BLOOD BANK Performed at [facility], [address] [place], [place] [address]      Assessment and Plan: Severe anemia  - Anemia panel ordered - GI consult despite the fact that she has guaiac negative - Will transfuse 2 units of blood very slowly to prevent pulmonary edema as her symptoms are chronic    Advance Care Planning:   Code Status: Full Code  The patient names her husband is her surrogate decision-maker and she wants to be full code.  Consults: GI  Family Communication: None  Severity of Illness: The appropriate patient status for this patient is INPATIENT. Inpatient status is judged to be reasonable and necessary in order to provide the required intensity of service to ensure the patient's safety. The patient's presenting symptoms, physical exam findings, and initial radiographic and laboratory data in the context of their chronic comorbidities is felt to place them at high risk for further clinical deterioration. Furthermore, it is not anticipated that the patient will be medically stable for discharge from the hospital within 2 midnights of admission.   * I certify that at the point of admission it is my clinical judgment that the patient will require inpatient hospital care spanning beyond 2 midnights from the point of admission due to high intensity of service, high risk for further deterioration and high frequency of surveillance required.*  Author: Willadean Hark, MD 12/12/2023 8:06 PM  For on call review [email or web address].

## 2023-12-13 DIAGNOSIS — D649 Anemia, unspecified: Secondary | ICD-10-CM | POA: Diagnosis not present

## 2023-12-13 DIAGNOSIS — R634 Abnormal weight loss: Secondary | ICD-10-CM

## 2023-12-13 DIAGNOSIS — K5909 Other constipation: Secondary | ICD-10-CM

## 2023-12-13 DIAGNOSIS — D509 Iron deficiency anemia, unspecified: Secondary | ICD-10-CM | POA: Diagnosis not present

## 2023-12-13 LAB — HEMOGLOBIN AND HEMATOCRIT, BLOOD
HCT: 25.3 % — ABNORMAL LOW (ref 36.0–46.0)
Hemoglobin: 7.4 g/dL — ABNORMAL LOW (ref 12.0–15.0)

## 2023-12-13 LAB — CBC
HCT: 22.7 % — ABNORMAL LOW (ref 36.0–46.0)
Hemoglobin: 6.9 g/dL — CL (ref 12.0–15.0)
MCH: 20.6 pg — ABNORMAL LOW (ref 26.0–34.0)
MCHC: 30.4 g/dL (ref 30.0–36.0)
MCV: 67.8 fL — ABNORMAL LOW (ref 80.0–100.0)
Platelets: 406 10*3/uL — ABNORMAL HIGH (ref 150–400)
RBC: 3.35 MIL/uL — ABNORMAL LOW (ref 3.87–5.11)
RDW: 26.2 % — ABNORMAL HIGH (ref 11.5–15.5)
WBC: 6.2 10*3/uL (ref 4.0–10.5)
nRBC: 0 % (ref 0.0–0.2)

## 2023-12-13 LAB — BASIC METABOLIC PANEL WITH GFR
Anion gap: 5 (ref 5–15)
BUN: 9 mg/dL (ref 8–23)
CO2: 22 mmol/L (ref 22–32)
Calcium: 8.9 mg/dL (ref 8.9–10.3)
Chloride: 111 mmol/L (ref 98–111)
Creatinine, Ser: 1.05 mg/dL — ABNORMAL HIGH (ref 0.44–1.00)
GFR, Estimated: 60 mL/min — ABNORMAL LOW (ref >60)
Glucose, Bld: 99 mg/dL (ref 70–99)
Potassium: 4 mmol/L (ref 3.5–5.1)
Sodium: 138 mmol/L (ref 135–145)

## 2023-12-13 LAB — PREPARE RBC (CROSSMATCH)

## 2023-12-13 LAB — HIV ANTIBODY (ROUTINE TESTING W REFLEX): HIV Screen 4th Generation wRfx: NONREACTIVE

## 2023-12-13 MED ORDER — NA SULFATE-K SULFATE-MG SULF 17.5-3.13-1.6 GM/177ML PO SOLN
0.5000 | Freq: Once | ORAL | Status: AC
  Administered 2023-12-13: 177 mL via ORAL
  Filled 2023-12-13: qty 1

## 2023-12-13 MED ORDER — BISACODYL 5 MG PO TBEC
15.0000 mg | DELAYED_RELEASE_TABLET | Freq: Once | ORAL | Status: AC
  Administered 2023-12-13: 15 mg via ORAL
  Filled 2023-12-13: qty 3

## 2023-12-13 MED ORDER — NA SULFATE-K SULFATE-MG SULF 17.5-3.13-1.6 GM/177ML PO SOLN
0.5000 | Freq: Once | ORAL | Status: AC
  Administered 2023-12-13: 177 mL via ORAL

## 2023-12-13 MED ORDER — PANTOPRAZOLE SODIUM 40 MG PO TBEC
40.0000 mg | DELAYED_RELEASE_TABLET | Freq: Two times a day (BID) | ORAL | Status: DC
  Administered 2023-12-13 – 2023-12-22 (×18): 40 mg via ORAL
  Filled 2023-12-13 (×19): qty 1

## 2023-12-13 MED ORDER — SIMETHICONE 80 MG PO CHEW
240.0000 mg | CHEWABLE_TABLET | Freq: Once | ORAL | Status: AC
  Administered 2023-12-13: 240 mg via ORAL
  Filled 2023-12-13: qty 3

## 2023-12-13 MED ORDER — POLYETHYLENE GLYCOL 3350 17 GM/SCOOP PO POWD
119.0000 g | Freq: Once | ORAL | Status: AC
  Administered 2023-12-13: 119 g via ORAL
  Filled 2023-12-13: qty 119

## 2023-12-13 MED ORDER — DIPHENHYDRAMINE HCL 50 MG/ML IJ SOLN
25.0000 mg | Freq: Once | INTRAMUSCULAR | Status: AC
  Administered 2023-12-13: 25 mg via INTRAVENOUS
  Filled 2023-12-13: qty 1

## 2023-12-13 MED ORDER — SODIUM CHLORIDE 0.9% IV SOLUTION: Freq: Once | INTRAVENOUS | Status: AC

## 2023-12-13 NOTE — Progress Notes (Signed)
 MEWS Progress Note  Patient Details Name: Leah Hernandez MRN: [id number] DOB: 1960/12/01 Today's Date: 12/13/2023   MEWS Flowsheet Documentation:  Assess: MEWS Score Temp: 98.8 F (37.1 C) BP: 132/76 MAP (mmHg): 92 Pulse Rate: (!) 122 ECG Heart Rate: (!) 101 Resp: 18 Level of Consciousness: Alert SpO2: 93 % O2 Device: Room Air Patient Activity (if Appropriate): In bed Assess: MEWS Score MEWS Temp: 0 MEWS Systolic: 0 MEWS Pulse: 2 MEWS RR: 0 MEWS LOC: 0 MEWS Score: 2 MEWS Score Color: Yellow Assess: SIRS CRITERIA SIRS Temperature : 0 SIRS Respirations : 0 SIRS Pulse: 1 SIRS WBC: 0 SIRS Score Sum : 1 Assess: if the MEWS score is Yellow or Red Were vital signs accurate and taken at a resting state?: Yes Does the patient meet 2 or more of the SIRS criteria?: No MEWS guidelines implemented : Yes, yellow Treat MEWS Interventions: Considered administering scheduled or prn medications/treatments as ordered Take Vital Signs Increase Vital Sign Frequency : Yellow: Q2hr x1, continue Q4hrs until patient remains green for 12hrs Escalate MEWS: Escalate: Yellow: Discuss with charge nurse and consider notifying provider and/or RRT Notify: Charge Nurse/RN Name of Charge Nurse/RN Notified: Mont Antis, RN      Syniyah Bourne Evone Hoh 12/13/2023, 7:54 PM

## 2023-12-13 NOTE — Hospital Course (Addendum)
 Mrs. Leah Hernandez was admitted to the hospital with the working diagnosis of symptomatic anemia.    64 y.o. female with no significant documented past medical history who presented to urgent care after a near syncope episode during grocery store.  Patient stated that she was very lightheaded and has been feeling weak for the last 4 to 5 months with shortness of breath on exertion. In the ED patient was afebrile but slightly tachycardic.   Initial labs showed hemoglobin of 5.1.   Urinalysis showed some rare bacteria with WBC 21-50.  BMP was notable for sodium low at 132 and creatinine at 1.0.  Iron  profile showed ferritin low at 3 with iron  of 10.  Vitamin B12 was 250 and TSH 3.8.  Fecal occult blood was negative.  Type and screen was done and patient received PRBC.  She went for endoscopy, workup significant for colon mass s/p biopsy, as well noted to have NSVT's, workup significant for low EF 20 to 25%  Patient was optimized from cardiovascular perspective.   05/20 Laparoscopic assisted extended right hemicolectomy

## 2023-12-13 NOTE — ED Notes (Signed)
 Patient reports a generalized, "Itching a little bit."  Approximately 3 hours post blood transfusion. Denies SOB, Respirations even and unlabored, No hives present on skin assessment, GCS 15. Per face to face conversation with MD Chiquita Councilman, Patient will receive another unit of blood.  (Vitals and new order in chart.)

## 2023-12-13 NOTE — Plan of Care (Signed)

## 2023-12-13 NOTE — Progress Notes (Signed)
 PROGRESS NOTE  [name]:[id number] DOB: 08/25/1960 DOA: 12/12/2023 PCP: Collins Dean, NP   LOS: 1 day   Brief narrative:  Leah Hernandez is a 63 y.o. female with no documented past medical history but without any regular follow-up to her primary care provider presented to urgent care after almost passing out spell during grocery store.  Patient's daughter insisted on coming to the hospital.  Patient stated that she was very lightheaded and has been feeling weak for the last 4 to 5 months with shortness of breath on exertion.  She admitted to streaks of blood attributing to hemorrhoids with some constipation.  In the ED patient was afebrile but slightly tachycardic.  Initial labs showed hemoglobin of 5.1.  Urinalysis showed some rare bacteria with WBC 21-50.  BMP was notable for sodium low at 132 and creatinine at 1.0.  Iron profile showed ferritin low at 3 with iron of 10.  Vitamin B12 was 250 and TSH 3.8.  Fecal occult blood was negative.  Type and screen was done and patient received PRBC with latest hemoglobin of 6.9.  GI was consulted and patient was then admitted hospital for further evaluation and treatment.     Assessment/Plan: Principal Problem:   Symptomatic anemia Active Problems:   Iron deficiency anemia   Loss of weight  Severe anemia  Likely from chronic GI blood loss.  History of aspirin ingestion occasionally for pain.  Patient admits to having some blood in stool while she is constipated.  Anemia panel with iron deficiency anemia.  Guaiac was negative but had specks of blood at home.  Plan for 2 units of packed RBC.  Latest hemoglobin of 6.9.  Repeat CBC in p.m. in AM.  GI has seen the patient and recommend endoscopic evaluation including upper GI endoscopy and colonoscopy.  DVT prophylaxis: SCDs Start: 12/12/23 1955   Disposition: Home likely in 1 to 2 days  Status is: Inpatient Remains inpatient appropriate because: Significant symptomatic anemia, need  for GI workup with EGD and colonoscopy    Code Status:     Code Status: Full Code  Family Communication: None at bedside  Consultants: GI  Procedures: PRBC transfusion 2 units  Anti-infectives:  None  Anti-infectives (From admission, onward)    None      Subjective: Today, patient was seen and examined at bedside.  Patient denies any chest pain, shortness of breath or dizziness.  Received 2 units of packed RBC today.  Objective: Vitals:   12/13/23 1000 12/13/23 1100  BP: 133/82   Pulse: (!) 111   Resp: 12   Temp:  98.2 F (36.8 C)  SpO2: 99%     Intake/Output Summary (Last 24 hours) at 12/13/2023 1255 Last data filed at 12/13/2023 8119 Gross per 24 hour  Intake 335 ml  Output --  Net 335 ml   Filed Weights   12/12/23 1642  Weight: 106.9 kg   Body mass index is 36.91 kg/m.   Physical Exam: GENERAL: Patient is alert awake and oriented. Not in obvious distress.  Obese built HENT: Pallor noted pupils equally reactive to light. Oral mucosa is moist NECK: is supple, no gross swelling noted. CHEST: Clear to auscultation. No crackles or wheezes.  Diminished breath sounds bilaterally. CVS: S1 and S2 heard, no murmur. Regular rate and rhythm.  ABDOMEN: Soft, non-tender, bowel sounds are present. EXTREMITIES: No edema. CNS: Cranial nerves are intact. No focal motor deficits. SKIN: warm and dry without rashes.  Data Review: I  have personally reviewed the following laboratory data and studies,  CBC: Recent Labs  Lab 12/12/23 1642 12/13/23 0607  WBC 6.3 6.2  NEUTROABS 3.7  --   HGB 5.1* 6.9*  HCT 18.7* 22.7*  MCV 62.5* 67.8*  PLT 512* 406*   Basic Metabolic Panel: Recent Labs  Lab 12/12/23 1642 12/12/23 2052 12/13/23 0607  NA 132* 133* 138  K 3.8 3.7 4.0  CL 104 105 111  CO2 20* 20* 22  GLUCOSE 105* 102* 99  BUN 14 12 9   CREATININE 1.08* 1.08* 1.05*  CALCIUM 8.6* 8.5* 8.9   Liver Function Tests: Recent Labs  Lab 12/12/23 2052  AST 21   ALT 11  ALKPHOS 82  BILITOT 0.5  PROT 6.8  ALBUMIN 2.8*   No results for input(s): "LIPASE", "AMYLASE" in the last 168 hours. No results for input(s): "AMMONIA" in the last 168 hours. Cardiac Enzymes: No results for input(s): "CKTOTAL", "CKMB", "CKMBINDEX", "TROPONINI" in the last 168 hours. BNP (last 3 results) No results for input(s): "BNP" in the last 8760 hours.  ProBNP (last 3 results) No results for input(s): "PROBNP" in the last 8760 hours.  CBG: No results for input(s): "GLUCAP" in the last 168 hours. No results found for this or any previous visit (from the past 240 hours).   Studies: No results found.    Cullin Dishman, MD  [facility] 12/13/2023  If 7PM-7AM, please contact night-coverage

## 2023-12-13 NOTE — ED Notes (Signed)
 Had ED unit secretary page admitting MD to notify of Critical Hbg 6.9, Shannen RN notified during Shift change report.

## 2023-12-13 NOTE — Consult Note (Addendum)
 Consultation Note   Referring Provider:  [facility] PCP: Collins Dean, NP Primary Gastroenterologist:   Para Bold       Reason for Consultation: Anemia DOA: 12/12/2023         Hospital Day: 2   ASSESSMENT    63 with severe iron deficiency anemia and unintentional weight loss over last few months ( ~ 30 pounds).  No overt GI blood loss, FOBT negative but need to rule out gastrointestinal neoplasm. Presenting hemoglobin 5.1.  No recent labs to compare.   Chronic intermittent constipation with associated lower abdominal pressure  See PMH for additional history  Principal Problem:   Symptomatic anemia      PLAN:   --Hemoglobin 6.9 this am. Confirmed with Hospitalist that that was drawn after the first unit and she has since had another unit. Will get follow up H/H later today if not already ordered.  -- Clear liquid diet --Recommend dose of IV iron -- Given that she is prone to constipation will start prepping bowels this morning in anticipation of EGD and colonoscopy tomorrow. The risks and benefits of EGD and colonoscopy with possible polypectomy / biopsies were discussed and the patient agrees to proceed.    HPI   63 y.o. year old female with a medical history including but not limited to  Leah Hernandez had been feeling poorly at home.  She has been lightheaded and has had some shortness of breath with exertion . She had a near syncopal episode yesterday.  [facility] where labs showed a hemoglobin of 5.7.  She was directed to the ED.  No recent labs to compare in [facility] but in 2021 her hemoglobin was 12.6.  Leah Hernandez has not had any overt GI blood loss.  She is Hemoccult negative.  She occasionally takes an aspirin but no other NSAIDs. She has not had any hematuria, vaginal bleeding or any source of blood loss to her knowledge.  She has lost about 30 pounds involuntarily over the last 3  months.  Her appetite has been poor.  She has chronic intermittent constipation with occasional rectal bleeding with straining.  When constipated she also tends to get lower abdominal pressure resolved with defecation.  Leah Hernandez has never had any form of colon cancer screening done.  No family history of colon cancer.  Labs this admission remarkable for hemoglobin 5.1, MCV 62, platelets 512.  Ferritin 3 . FOBT negative.  Albumin 2.8, otherwise normal LFTs, creatinine 1.05 /GFR 60   Previous GI Studies  none   Labs and Imaging:  Pertinent labs Labs drawn by [facility] 12/12/2023 remarkable for not only anemia but mildly elevated alk phos of 110.Aaron Aas  B12 level normal.  TSH normal.    Recent Labs    12/12/23 1642 12/13/23 0607  WBC 6.3 6.2  HGB 5.1* 6.9*  HCT 18.7* 22.7*  MCV 62.5* 67.8*  PLT 512* 406*   Recent Labs    12/12/23 1955 12/12/23 2042  FOLATE  --  10.0  VITAMINB12  --  250  FERRITIN 3*  --   TIBC 379  --   IRONPCTSAT 3*  --    Recent Labs    12/12/23 1642 12/12/23 2052 12/13/23 1610  NA 132* 133* 138  K 3.8 3.7 4.0  CL 104 105 111  CO2 20* 20* 22  GLUCOSE 105* 102* 99  BUN 14 12 9   CREATININE 1.08* 1.08* 1.05*  CALCIUM 8.6* 8.5* 8.9   Recent Labs    12/12/23 2052  PROT 6.8  ALBUMIN 2.8*  AST 21  ALT 11  ALKPHOS 82  BILITOT 0.5     DG Shoulder Left CLINICAL DATA:  Fall on left shoulder 1 month ago. Worsening pain over that time period.  EXAM: LEFT SHOULDER - 2+ VIEW  COMPARISON:  None.  FINDINGS: There is no evidence of fracture or dislocation. There is no evidence of arthropathy or other focal bone abnormality. Soft tissues are unremarkable.  IMPRESSION: Negative.  Electronically Signed   By: Janeece Mechanic M.D.   On: 02/27/2015 11:26    Past Medical History:  Diagnosis Date   Prediabetes     Past Surgical History:  Procedure Laterality Date   TUBAL LIGATION      Family History  Problem Relation Age of Onset   Hypertension  Neg Hx     Prior to Admission medications   Medication Sig Start Date End Date Taking? Authorizing Provider  acetaminophen  (TYLENOL ) 325 MG tablet Take 325-650 mg by mouth every 6 (six) hours as needed for mild pain (pain score 1-3) or moderate pain (pain score 4-6).   Yes [provider]    Current Facility-Administered Medications  Medication Dose Route Frequency Provider Last Rate Last Admin   melatonin tablet 3 mg  3 mg Oral QHS PRN Claiborne, Claudia, MD       ondansetron (ZOFRAN) tablet 4 mg  4 mg Oral Q6H PRN Willadean Hark, MD       Or   ondansetron (ZOFRAN) injection 4 mg  4 mg Intravenous Q6H PRN Claiborne, Claudia, MD       pantoprazole (PROTONIX) EC tablet 40 mg  40 mg Oral BID Claiborne, Claudia, MD   40 mg at 12/13/23 0981   Current Outpatient Medications  Medication Sig Dispense Refill   acetaminophen  (TYLENOL ) 325 MG tablet Take 325-650 mg by mouth every 6 (six) hours as needed for mild pain (pain score 1-3) or moderate pain (pain score 4-6).      Allergies as of 12/12/2023   (No Known Allergies)    Social History   Socioeconomic History   Marital status: Married    Spouse name: Not on file   Number of children: Not on file   Years of education: Not on file   Highest education level: Not on file  Occupational History   Not on file  Tobacco Use   Smoking status: Never   Smokeless tobacco: Never  Vaping Use   Vaping status: Never Used  Substance and Sexual Activity   Alcohol use: No   Drug use: No   Sexual activity: Yes    Birth control/protection: Surgical  Other Topics Concern   Not on file  Social History Narrative   Not on file   Social Drivers of Health   Financial Resource Strain: Not on file  Food Insecurity: Not on file  Transportation Needs: Not on file  Physical Activity: Not on file  Stress: Not on file  Social Connections: Not on file  Intimate Partner Violence: Not on file     Code Status   Code Status: Full  Code  Review of Systems: All systems reviewed and negative except where noted in HPI.  Physical Exam: Vital signs  in last 24 hours: Temp:  [98.1 F (36.7 C)-99.5 F (37.5 C)] 98.5 F (36.9 C) (05/15 0605) Pulse Rate:  [96-124] 105 (05/15 0800) Resp:  [12-22] 13 (05/15 0800) BP: (103-130)/(64-83) 110/71 (05/15 0800) SpO2:  [97 %-100 %] 99 % (05/15 0800) Weight:  [106.9 kg] 106.9 kg (05/14 1642)    General:  Pleasant female in NAD Psych:  Cooperative. Normal mood and affect Eyes: Pupils equal Ears:  Normal auditory acuity Nose: No deformity, discharge or lesions Neck:  Supple, no masses felt Lungs:  Clear to auscultation.  Heart:  Regular rate, regular rhythm.  Abdomen:  Soft, nondistended, nontender, active bowel sounds, no masses felt Rectal :  Deferred Msk: Symmetrical without gross deformities.  Neurologic:  Alert, oriented, grossly normal neurologically Extremities : No edema Skin:  Intact without significant lesions.    Intake/Output from previous day: 05/14 0701 - 05/15 0700 In: 335 [I.V.:20; Blood:315] Out: -  Intake/Output this shift:  No intake/output data recorded.   Leah Schwalbe, NP-C   12/13/2023, 9:19 AM

## 2023-12-14 ENCOUNTER — Inpatient Hospital Stay (HOSPITAL_COMMUNITY): Admitting: Anesthesiology

## 2023-12-14 ENCOUNTER — Encounter (HOSPITAL_COMMUNITY): Payer: Self-pay | Admitting: Internal Medicine

## 2023-12-14 ENCOUNTER — Encounter (HOSPITAL_COMMUNITY)
Admission: EM | Disposition: A | Discharge status: Home / Self Care | Payer: Self-pay | Source: Home / Self Care | Attending: Internal Medicine

## 2023-12-14 DIAGNOSIS — K5730 Diverticulosis of large intestine without perforation or abscess without bleeding: Secondary | ICD-10-CM | POA: Diagnosis not present

## 2023-12-14 DIAGNOSIS — D490 Neoplasm of unspecified behavior of digestive system: Secondary | ICD-10-CM | POA: Diagnosis not present

## 2023-12-14 DIAGNOSIS — K648 Other hemorrhoids: Secondary | ICD-10-CM

## 2023-12-14 DIAGNOSIS — C183 Malignant neoplasm of hepatic flexure: Secondary | ICD-10-CM | POA: Diagnosis not present

## 2023-12-14 DIAGNOSIS — K2950 Unspecified chronic gastritis without bleeding: Secondary | ICD-10-CM | POA: Diagnosis not present

## 2023-12-14 DIAGNOSIS — D509 Iron deficiency anemia, unspecified: Secondary | ICD-10-CM | POA: Diagnosis not present

## 2023-12-14 DIAGNOSIS — C184 Malignant neoplasm of transverse colon: Principal | ICD-10-CM

## 2023-12-14 DIAGNOSIS — K635 Polyp of colon: Secondary | ICD-10-CM

## 2023-12-14 DIAGNOSIS — K625 Hemorrhage of anus and rectum: Secondary | ICD-10-CM

## 2023-12-14 DIAGNOSIS — D122 Benign neoplasm of ascending colon: Secondary | ICD-10-CM

## 2023-12-14 DIAGNOSIS — D128 Benign neoplasm of rectum: Secondary | ICD-10-CM | POA: Diagnosis not present

## 2023-12-14 DIAGNOSIS — K2970 Gastritis, unspecified, without bleeding: Secondary | ICD-10-CM | POA: Diagnosis not present

## 2023-12-14 DIAGNOSIS — K5900 Constipation, unspecified: Secondary | ICD-10-CM | POA: Diagnosis not present

## 2023-12-14 DIAGNOSIS — K6389 Other specified diseases of intestine: Secondary | ICD-10-CM | POA: Diagnosis not present

## 2023-12-14 DIAGNOSIS — K449 Diaphragmatic hernia without obstruction or gangrene: Secondary | ICD-10-CM

## 2023-12-14 DIAGNOSIS — K56600 Partial intestinal obstruction, unspecified as to cause: Secondary | ICD-10-CM

## 2023-12-14 DIAGNOSIS — D649 Anemia, unspecified: Secondary | ICD-10-CM | POA: Diagnosis not present

## 2023-12-14 DIAGNOSIS — I5021 Acute systolic (congestive) heart failure: Secondary | ICD-10-CM | POA: Diagnosis not present

## 2023-12-14 HISTORY — PX: COLONOSCOPY: SHX5424

## 2023-12-14 HISTORY — PX: ESOPHAGOGASTRODUODENOSCOPY: SHX5428

## 2023-12-14 HISTORY — PX: BIOPSY OF SKIN SUBCUTANEOUS TISSUE AND/OR MUCOUS MEMBRANE: SHX6741

## 2023-12-14 HISTORY — PX: SCLEROTHERAPY: SHX6841

## 2023-12-14 HISTORY — PX: POLYPECTOMY: SHX149

## 2023-12-14 LAB — BASIC METABOLIC PANEL WITH GFR
Anion gap: 8 (ref 5–15)
BUN: 6 mg/dL — ABNORMAL LOW (ref 8–23)
CO2: 22 mmol/L (ref 22–32)
Calcium: 8.8 mg/dL — ABNORMAL LOW (ref 8.9–10.3)
Chloride: 108 mmol/L (ref 98–111)
Creatinine, Ser: 1.06 mg/dL — ABNORMAL HIGH (ref 0.44–1.00)
GFR, Estimated: 59 mL/min — ABNORMAL LOW (ref >60)
Glucose, Bld: 95 mg/dL (ref 70–99)
Potassium: 3.8 mmol/L (ref 3.5–5.1)
Sodium: 138 mmol/L (ref 135–145)

## 2023-12-14 LAB — HEMOGLOBIN AND HEMATOCRIT, BLOOD
HCT: 23.4 % — ABNORMAL LOW (ref 36.0–46.0)
HCT: 28.6 % — ABNORMAL LOW (ref 36.0–46.0)
Hemoglobin: 7 g/dL — ABNORMAL LOW (ref 12.0–15.0)
Hemoglobin: 8.8 g/dL — ABNORMAL LOW (ref 12.0–15.0)

## 2023-12-14 LAB — GLUCOSE, CAPILLARY: Glucose-Capillary: 85 mg/dL (ref 70–99)

## 2023-12-14 LAB — PREPARE RBC (CROSSMATCH)

## 2023-12-14 LAB — MAGNESIUM: Magnesium: 1.8 mg/dL (ref 1.7–2.4)

## 2023-12-14 SURGERY — COLONOSCOPY
Anesthesia: Monitor Anesthesia Care

## 2023-12-14 MED ORDER — PROPOFOL 500 MG/50ML IV EMUL
INTRAVENOUS | Status: DC | PRN
  Administered 2023-12-14: 50 mg via INTRAVENOUS
  Administered 2023-12-14: 100 ug/kg/min via INTRAVENOUS

## 2023-12-14 MED ORDER — SODIUM CHLORIDE 0.9 % IV SOLN: INTRAVENOUS | Status: DC | PRN

## 2023-12-14 MED ORDER — CYANOCOBALAMIN 1000 MCG/ML IJ SOLN
1000.0000 ug | Freq: Every day | INTRAMUSCULAR | Status: AC
  Administered 2023-12-14 – 2023-12-20 (×5): 1000 ug via SUBCUTANEOUS
  Filled 2023-12-14 (×7): qty 1

## 2023-12-14 MED ORDER — SPOT INK MARKER SYRINGE KIT
PACK | SUBMUCOSAL | Status: DC | PRN
  Administered 2023-12-14: 4 mL via SUBMUCOSAL

## 2023-12-14 MED ORDER — DIPHENHYDRAMINE HCL 25 MG PO CAPS
25.0000 mg | ORAL_CAPSULE | Freq: Once | ORAL | Status: AC
Start: 2023-12-14 — End: 2023-12-14
  Administered 2023-12-14: 25 mg via ORAL
  Filled 2023-12-14: qty 1

## 2023-12-14 MED ORDER — SPOT INK MARKER SYRINGE KIT
PACK | SUBMUCOSAL | Status: AC
  Filled 2023-12-14: qty 5

## 2023-12-14 MED ORDER — ACETAMINOPHEN 325 MG PO TABS: 650.0000 mg | ORAL_TABLET | Freq: Four times a day (QID) | ORAL | Status: DC | PRN

## 2023-12-14 MED ORDER — SODIUM CHLORIDE 0.9% IV SOLUTION: Freq: Once | INTRAVENOUS | Status: DC

## 2023-12-14 MED ORDER — ACETAMINOPHEN 325 MG PO TABS
650.0000 mg | ORAL_TABLET | Freq: Four times a day (QID) | ORAL | Status: DC | PRN
  Administered 2023-12-14: 650 mg via ORAL
  Filled 2023-12-14: qty 2

## 2023-12-14 MED ORDER — POTASSIUM CHLORIDE CRYS ER 20 MEQ PO TBCR
40.0000 meq | EXTENDED_RELEASE_TABLET | Freq: Once | ORAL | Status: AC
  Administered 2023-12-14: 40 meq via ORAL
  Filled 2023-12-14: qty 2

## 2023-12-14 NOTE — Progress Notes (Signed)
 Patient has 12 beats of NSVT, will add magnesium to am labs, replace potassium for target >4, will check echo. Seena Dadds MD

## 2023-12-14 NOTE — Plan of Care (Signed)

## 2023-12-14 NOTE — Transfer of Care (Signed)
 Immediate Anesthesia Transfer of Care Note  Patient: Leah Hernandez  Procedure(s) Performed: COLONOSCOPY EGD (ESOPHAGOGASTRODUODENOSCOPY) BIOPSY, SKIN, SUBCUTANEOUS TISSUE, OR MUCOUS MEMBRANE POLYPECTOMY, INTESTINE SCLEROTHERAPY  Patient Location: PACU  Anesthesia Type:MAC  Level of Consciousness: awake and drowsy  Airway & Oxygen Therapy: Patient Spontanous Breathing and Patient connected to face mask oxygen  Post-op Assessment: Report given to RN and Post -op Vital signs reviewed and stable  Post vital signs: Reviewed and stable  Last Vitals:  Vitals Value Taken Time  BP 98/67 12/14/23 0941  Temp    Pulse 83 12/14/23 0943  Resp 21 12/14/23 0943  SpO2 100 % 12/14/23 0943  Vitals shown include unfiled device data.  Last Pain:  Vitals:   12/14/23 0748  TempSrc: Oral  PainSc: 0-No pain         Complications: No notable events documented.

## 2023-12-14 NOTE — Progress Notes (Signed)
 PROGRESS NOTE    Leah Hernandez  ION:[id number] DOB: 05-Jul-1961 DOA: 12/12/2023 PCP: Collins Dean, NP   Chief Complaint  Patient presents with   fainted yesterday    Brief Narrative:  Leah Hernandez is a 63 y.o. female with no medical history no regular follow-up presented to urgent care this morning for an episode where she almost passed out in the grocery store yesterday. The patient's daughter insisted on her going to urgent care after the episode yesterday.  Blood work was drawn and later the patient was called and told to get to the ER right away. Patient said she just felt very lightheaded and almost collapsed at the grocery store.  She has been feeling weak with occasional lightheadedness for the last 5 months.  She does get short of breath with exertion.  She denies any chest pain or swelling.  She does reports poor appetite and some constipation.  She has not had any significant bleeding.  No nosebleeds or vomiting blood no blood in her urine she has not had any vaginal bleeding.  He is occasional mild streaks of blood which she attributes to hemorrhoids.  He does have mild constipation.  She has not been any accidents or had any cuts.  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 - GI input greatly appreciated, status post endoscopy and colonoscopy 5/16, workup significant for transverse colon mass concerning for malignancy s/p biopsy, and gastritis. - Follow on biopsy results, - Will obtain CT chest/abdomen/pelvis, with and without contrast to rule out metastasis. - Will keep on clear  liquid diet for now pending further workup - Will start on B12 supplements. - Will benefit from IV iron, likely by tomorrow -Transfused units PRBC yesterday, hemoglobin low at 7 this morning will transfuse another unit PRBC - Endoscopy significant for gastritis follow-up on biopsy, continue with PPI  Renal insufficiency - No clear baseline,     DVT prophylaxis: SCD, she was encouraged to ambulate  code Status: Full code Family Communication: None at bedside Disposition:   Status is: Inpatient    Consultants:  Gastroenterology   Subjective: No nausea, no vomiting, reports generalized weakness and fatigue  Objective: Vitals:   12/14/23 1000 12/14/23 1024 12/14/23 1103 12/14/23 1120  BP: 114/67 111/70 108/74 115/75  Pulse: 91 91 85   Resp: 19  16   Temp:  (!) 97.5 F (36.4 C) (!) 97.5 F (36.4 C) 97.6 F (36.4 C)  TempSrc:  Oral Oral Oral  SpO2: 98% 99% 100% 100%  Weight:      Height:        Intake/Output Summary (Last 24 hours) at 12/14/2023 1149 Last data filed at 12/14/2023 1118 Gross per 24 hour  Intake 870 ml  Output --  Net 870 ml   Filed Weights   12/12/23 1642  Weight: 106.9 kg    Examination:   Awake Alert, Oriented X 3, No new F.N deficits, Normal affect Symmetrical Chest wall movement, Good air movement bilaterally, CTAB RRR,No Gallops,Rubs or new Murmurs, No Parasternal Heave +ve B.Sounds, Abd Soft, No tenderness, No rebound - guarding or rigidity. No Cyanosis, Clubbing or edema, No new Rash  or bruise      Data Reviewed: I have personally reviewed following labs and imaging studies  CBC: Recent Labs  Lab 12/12/23 1642 12/13/23 0607 12/13/23 1638 12/14/23 0449  WBC 6.3 6.2  --   --   NEUTROABS 3.7  --   --   --   HGB 5.1* 6.9* 7.4* 7.0*  HCT 18.7* 22.7* 25.3* 23.4*  MCV 62.5* 67.8*  --   --   PLT 512* 406*  --   --     Basic Metabolic Panel: Recent Labs  Lab 12/12/23 1642 12/12/23 2052 12/13/23 0607 12/14/23 0449  NA 132*  133* 138 138  K 3.8 3.7 4.0 3.8  CL 104 105 111 108  CO2 20* 20* 22 22  GLUCOSE 105* 102* 99 95  BUN 14 12 9  6*  CREATININE 1.08* 1.08* 1.05* 1.06*  CALCIUM 8.6* 8.5* 8.9 8.8*    GFR: Estimated Creatinine Clearance: 68.3 mL/min (A) (by C-G formula based on SCr of 1.06 mg/dL (H)).  Liver Function Tests: Recent Labs  Lab 12/12/23 2052  AST 21  ALT 11  ALKPHOS 82  BILITOT 0.5  PROT 6.8  ALBUMIN 2.8*    CBG: Recent Labs  Lab 12/14/23 0727  GLUCAP 85     No results found for this or any previous visit (from the past 240 hours).       Radiology Studies: No results found.      Scheduled Meds:  sodium chloride   Intravenous Once   cyanocobalamin  1,000 mcg Subcutaneous Daily   pantoprazole  40 mg Oral BID   Continuous Infusions:   LOS: 2 days     Seena Dadds, MD [facility]   To contact the attending provider between 7A-7P or the covering provider during after hours 7P-7A, please log into the web site [email or web address] and access using universal [place] password for that web site. If you do not have the password, please call the hospital operator.  12/14/2023, 11:49 AM

## 2023-12-14 NOTE — Progress Notes (Signed)
 Patient off unit; will administer blood when patient return to unit.

## 2023-12-14 NOTE — H&P (Signed)
 [facility] History and Physical   Primary Care Physician:  Collins Dean, NP   Reason for Procedure:  Anemia, weight loss, constipation, rectal bleeding  Plan:    Upper endoscopy and colonoscopy     HPI: Leah Hernandez is a 63 y.o. female undergoing endoscopy and colonoscopy for investigation of anemia, weight loss, constipation and rectal bleeding.  Patient was admitted to the hospital with weakness and found to have a hemoglobin of 5.1.  Has since undergone transfusion with most recent hemoglobin 7.  Endorses an unintentional 30 pound weight loss.  Has had symptoms of constipation with associated rectal bleeding.  Denies abdominal pain.  No prior upper endoscopy or colonoscopy.  Stool Hemoccult negative.   Past Medical History:  Diagnosis Date   Prediabetes     Past Surgical History:  Procedure Laterality Date   TUBAL LIGATION      Prior to Admission medications   Medication Sig Start Date End Date Taking? Authorizing Provider  acetaminophen  (TYLENOL ) 325 MG tablet Take 325-650 mg by mouth every 6 (six) hours as needed for mild pain (pain score 1-3) or moderate pain (pain score 4-6).   Yes [provider]    Current Facility-Administered Medications  Medication Dose Route Frequency Provider Last Rate Last Admin   [MAR Hold] 0.9 %  sodium chloride infusion (Manually program via Guardrails IV Fluids)   Intravenous Once Elgergawy, Dawood S, MD       [MAR Hold] cyanocobalamin (VITAMIN B12) injection 1,000 mcg  1,000 mcg Subcutaneous Daily Elgergawy, Ardia Kraft, MD       [MAR Hold] melatonin tablet 3 mg  3 mg Oral QHS PRN Claiborne, Claudia, MD       [facility] Hold] ondansetron (ZOFRAN) tablet 4 mg  4 mg Oral Q6H PRN Willadean Hark, MD   4 mg at 12/13/23 2205   Or   [MAR Hold] ondansetron (ZOFRAN) injection 4 mg  4 mg Intravenous Q6H PRN Willadean Hark, MD       [facility] Hold] pantoprazole (PROTONIX) EC tablet 40 mg  40 mg Oral BID Claiborne, Claudia, MD    40 mg at 12/13/23 2132    Allergies as of 12/12/2023   (No Known Allergies)    Family History  Problem Relation Age of Onset   Hypertension Neg Hx     Social History   Socioeconomic History   Marital status: Married    Spouse name: Not on file   Number of children: Not on file   Years of education: Not on file   Highest education level: Not on file  Occupational History   Not on file  Tobacco Use   Smoking status: Never   Smokeless tobacco: Never  Vaping Use   Vaping status: Never Used  Substance and Sexual Activity   Alcohol use: No   Drug use: No   Sexual activity: Yes    Birth control/protection: Surgical  Other Topics Concern   Not on file  Social History Narrative   Not on file   Social Drivers of Health   Financial Resource Strain: Not on file  Food Insecurity: No Food Insecurity (12/13/2023)   Hunger Vital Sign    Worried About Running Out of Food in the Last Year: Never true    Ran Out of Food in the Last Year: Never true  Transportation Needs: No Transportation Needs (12/13/2023)   PRAPARE - Administrator, Civil Service (Medical): No    Lack of Transportation (Non-Medical): No  Physical Activity: Not on file  Stress: Not on file  Social Connections: Not on file  Intimate Partner Violence: Not At Risk (12/13/2023)   Humiliation, Afraid, Rape, and Kick questionnaire    Fear of Current or Ex-Partner: No    Emotionally Abused: No    Physically Abused: No    Sexually Abused: No    Review of Systems:  All other review of systems negative except as mentioned in the HPI.  Physical Exam: Vital signs BP 120/88   Pulse 99   Temp 98.4 F (36.9 C) (Oral)   Resp 14   Ht 5\' 7"  (1.702 m)   Wt 106.9 kg   SpO2 100%   BMI 36.91 kg/m   General:   Alert,  Well-developed, well-nourished, pleasant and cooperative in NAD Lungs:  Clear throughout to auscultation.   Heart:  Regular rate and rhythm; no murmurs, clicks, rubs,  or gallops. Abdomen:   Soft, nontender and nondistended. Normal bowel sounds.   Neuro/Psych:  Normal mood and affect. A and O x 3  Eugenia Hess, MD [facility]

## 2023-12-14 NOTE — Progress Notes (Signed)
   12/14/23 1219  TOC Brief Assessment  Insurance and Status Reviewed (Medicaid [employer])  Patient has primary care physician Yes Jamal Mays, Maile Score, NP)  Home environment has been reviewed From home with Spouse  Prior level of function: independent  Prior/Current Home Services No current home services  Social Drivers of Health Review SDOH reviewed no interventions necessary  Readmission risk has been reviewed Yes (10%)   Biopsy results pending  Please place [facility] consult for any discharge needs

## 2023-12-14 NOTE — Progress Notes (Signed)
 Patient was already off unit when RN entered room to do assessment after getting shift report.

## 2023-12-14 NOTE — Anesthesia Preprocedure Evaluation (Signed)
 Anesthesia Evaluation  Patient identified by MRN, date of birth, ID band Patient awake    Reviewed: Allergy & Precautions, H&P , NPO status , Patient's Chart, lab work & pertinent test results  Airway Mallampati: IV  TM Distance: >3 FB Neck ROM: Full    Dental  (+) Teeth Intact, Dental Advisory Given   Pulmonary neg pulmonary ROS   Pulmonary exam normal breath sounds clear to auscultation       Cardiovascular negative cardio ROS Normal cardiovascular exam Rhythm:Regular Rate:Normal     Neuro/Psych negative neurological ROS  negative psych ROS   GI/Hepatic negative GI ROS, Neg liver ROS,,,  Endo/Other  diabetes (prediabetic)  BMI 37  Renal/GU negative Renal ROS  negative genitourinary   Musculoskeletal negative musculoskeletal ROS (+)    Abdominal   Peds negative pediatric ROS (+)  Hematology  (+) Blood dyscrasia, anemia Hb 7, plt 406   Anesthesia Other Findings   Reproductive/Obstetrics negative OB ROS                             Anesthesia Physical Anesthesia Plan  ASA: 2  Anesthesia Plan: MAC   Post-op Pain Management:    Induction:   PONV Risk Score and Plan: Propofol infusion, TIVA and Treatment may vary due to age or medical condition  Airway Management Planned: Simple Face Mask and Natural Airway  Additional Equipment: None  Intra-op Plan:   Post-operative Plan:   Informed Consent: I have reviewed the patients History and Physical, chart, labs and discussed the procedure including the risks, benefits and alternatives for the proposed anesthesia with the patient or authorized representative who has indicated his/her understanding and acceptance.     Dental advisory given  Plan Discussed with: CRNA  Anesthesia Plan Comments:        Anesthesia Quick Evaluation

## 2023-12-14 NOTE — Op Note (Signed)
 [facility] Patient Name: Leah Hernandez Procedure Date : 12/14/2023 MRN: [id number] Attending MD: Eugenia Hess , MD, [id number] Date of Birth: 13-Mar-1961 CSN: [id number] Age: 63 Admit Type: Inpatient Procedure:                Upper GI endoscopy Indications:              Iron deficiency anemia, Weight loss Providers:                Eugenia Hess, MD, Lonzell Robin, RN, Joline Ned, Technician Referring MD:              Medicines:                Monitored Anesthesia Care Complications:            No immediate complications. Estimated blood loss:                            Minimal. Estimated Blood Loss:     Estimated blood loss was minimal. Procedure:                Pre-Anesthesia Assessment:                           - Prior to the procedure, a History and Physical                            was performed, and patient medications and                            allergies were reviewed. The patient's tolerance of                            previous anesthesia was also reviewed. The risks                            and benefits of the procedure and the sedation                            options and risks were discussed with the patient.                            All questions were answered, and informed consent                            was obtained. Prior Anticoagulants: The patient has                            taken no anticoagulant or antiplatelet agents. ASA                            Grade Assessment: II - A patient with mild systemic  disease. After reviewing the risks and benefits,                            the patient was deemed in satisfactory condition to                            undergo the procedure.                           After obtaining informed consent, the endoscope was                            passed under direct vision. Throughout the                            procedure, the  patient's blood pressure, pulse, and                            oxygen saturations were monitored continuously. The                            [id number]-[id number] ([id number]) Olympus endoscope was introduced                            through the mouth, and advanced to the second part                            of duodenum. The upper GI endoscopy was                            accomplished without difficulty. The patient                            tolerated the procedure well. Scope In: Scope Out: Findings:      The examined esophagus was normal.      Localized mild inflammation characterized by erosions and erythema was       found in the prepyloric region of the stomach. Biopsies were taken with       a cold forceps for Helicobacter pylori testing.      The gastric body, gastric antrum, cardia (on retroflexion) and gastric       fundus (on retroflexion) were normal. A small hiatal hernia was noted on       retroflexion. Biopsies were taken with a cold forceps for Helicobacter       pylori testing.      The duodenal bulb and second portion of the duodenum were normal. Impression:               - Normal esophagus.                           - Gastritis. Biopsied.                           - Small hiatal hernia.                           -  Normal gastric body, antrum, cardia and gastric                            fundus. Biopsied.                           - Normal duodenal bulb and second portion of the                            duodenum. Recommendation:           - Await pathology results.                           - Perform a colonoscopy today.                           - The findings and recommendations were discussed                            with the patient. Procedure Code(s):        --- Professional ---                           [phone], Esophagogastroduodenoscopy, flexible,                            transoral; with biopsy, single or multiple Diagnosis Code(s):        --- Professional  ---                           K29.70, Gastritis, unspecified, without bleeding                           D50.9, Iron deficiency anemia, unspecified                           R63.4, Abnormal weight loss CPT copyright 2022 American Medical Association. All rights reserved. The codes documented in this report are preliminary and upon coder review may  be revised to meet current compliance requirements. Eugenia Hess, MD 12/14/2023 9:40:13 AM This report has been signed electronically. Number of Addenda: 0

## 2023-12-14 NOTE — Op Note (Signed)
 [facility] Patient Name: Leah Hernandez Procedure Date : 12/14/2023 MRN: [id number] Attending MD: Eugenia Hess , MD, [id number] Date of Birth: 10/31/1960 CSN: [id number] Age: 63 Admit Type: Inpatient Procedure:                Colonoscopy Indications:              Rectal bleeding, Iron deficiency anemia,                            Constipation, Weight loss Providers:                Eugenia Hess, MD, Lonzell Robin, RN, Joline Ned, Technician Referring MD:              Medicines:                Monitored Anesthesia Care Complications:            No immediate complications. Estimated blood loss:                            Minimal. Estimated Blood Loss:     Estimated blood loss was minimal. Procedure:                Pre-Anesthesia Assessment:                           - Prior to the procedure, a History and Physical                            was performed, and patient medications and                            allergies were reviewed. The patient's tolerance of                            previous anesthesia was also reviewed. The risks                            and benefits of the procedure and the sedation                            options and risks were discussed with the patient.                            All questions were answered, and informed consent                            was obtained. Prior Anticoagulants: The patient has                            taken no anticoagulant or antiplatelet agents. ASA                            Grade Assessment: II - A  patient with mild systemic                            disease. After reviewing the risks and benefits,                            the patient was deemed in satisfactory condition to                            undergo the procedure.                           After obtaining informed consent, the colonoscope                            was passed under direct vision. Throughout  the                            procedure, the patient's blood pressure, pulse, and                            oxygen saturations were monitored continuously. The                            CF-HQ190L ([id number] Olympus coloscope was                            introduced through the anus and advanced to the                            cecum, identified by appendiceal orifice and                            ileocecal valve. The colonoscopy was performed                            without difficulty. The patient tolerated the                            procedure well. The quality of the bowel                            preparation was good. The ileocecal valve,                            appendiceal orifice, and rectum were photographed. Scope In: 9:07:01 AM Scope Out: 9:35:33 AM Scope Withdrawal Time: 0 hours 22 minutes 53 seconds  Total Procedure Duration: 0 hours 28 minutes 32 seconds  Findings:      Hemorrhoids were found on perianal exam.      The digital rectal exam was normal. Pertinent negatives include normal       sphincter tone and no palpable rectal lesions.      Multiple small-mouthed diverticula were found in the sigmoid colon,       descending colon and ascending colon.  Two sessile polyps were found in the rectum and proximal ascending       colon. The polyps were 7 to 11 mm in size. These polyps were removed       with a hot snare. Resection and retrieval were complete.      A fungating and infiltrative partially obstructing large mass was found       in the transverse colon. The mass was circumferential. The mass measured       seven cm in length. Biopsies were taken with a cold forceps for       histology. Area was tattooed with an injection of 4 mL of [place] ink       proximal and distal to the mass lesion.      Internal hemorrhoids were found during retroflexion. Impression:               - Hemorrhoids found on perianal exam.                           - Diverticulosis  in the sigmoid colon, in the                            descending colon and in the ascending colon.                           - Two 7 to 11 mm polyps in the rectum and in the                            proximal ascending colon, removed with a hot snare.                            Resected and retrieved.                           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 Biopsied. Tattooed. This is a                            likely source of anemia and weight loss.                           - Internal hemorrhoids. Recommendation:           - Return patient to hospital ward for ongoing care.                           - Await pathology results.                           - If pathology confirms malignancy obtain oncology                            consultation and staging scans of chest abdomen and  pelvis                           - Continue to monitor serial H&H and transfuse as                            appropriate.                           - The findings and recommendations were discussed                            with the patient. Procedure Code(s):        --- Professional ---                           [phone], Colonoscopy, flexible; with removal of                            tumor(s), polyp(s), or other lesion(s) by snare                            technique                           45380, 59, Colonoscopy, flexible; with biopsy,                            single or multiple                           45381, Colonoscopy, flexible; with directed                            submucosal injection(s), any substance Diagnosis Code(s):        --- Professional ---                           K64.8, Other hemorrhoids                           D12.8, Benign neoplasm of rectum                           D12.2, Benign neoplasm of ascending colon                           D49.0, Neoplasm of unspecified behavior of                             digestive system                           K56.690, Other partial intestinal obstruction                           K62.5, Hemorrhage of anus and rectum  D50.9, Iron deficiency anemia, unspecified                           K59.00, Constipation, unspecified                           R63.4, Abnormal weight loss CPT copyright 2022 American Medical Association. All rights reserved. The codes documented in this report are preliminary and upon coder review may  be revised to meet current compliance requirements. Eugenia Hess, MD 12/14/2023 9:50:42 AM This report has been signed electronically. Number of Addenda: 0

## 2023-12-14 NOTE — Anesthesia Postprocedure Evaluation (Signed)
 Anesthesia Post Note  Patient: Leah Hernandez  Procedure(s) Performed: COLONOSCOPY EGD (ESOPHAGOGASTRODUODENOSCOPY) BIOPSY, SKIN, SUBCUTANEOUS TISSUE, OR MUCOUS MEMBRANE POLYPECTOMY, INTESTINE SCLEROTHERAPY     Patient location during evaluation: PACU Anesthesia Type: MAC Level of consciousness: awake and alert Pain management: pain level controlled Vital Signs Assessment: post-procedure vital signs reviewed and stable Respiratory status: spontaneous breathing, nonlabored ventilation and respiratory function stable Cardiovascular status: blood pressure returned to baseline and stable Postop Assessment: no apparent nausea or vomiting Anesthetic complications: no   No notable events documented.  Last Vitals:  Vitals:   12/14/23 0950 12/14/23 1000  BP: 102/69 114/67  Pulse: 87 91  Resp: (!) 23 19  Temp:    SpO2: 100% 98%    Last Pain:  Vitals:   12/14/23 1000  TempSrc:   PainSc: 0-No pain                 Jacquelyne Matte

## 2023-12-14 NOTE — Progress Notes (Signed)
 Blood consent obtained and signed by patient. Consent placed in patient's chart.

## 2023-12-15 ENCOUNTER — Inpatient Hospital Stay (HOSPITAL_COMMUNITY)

## 2023-12-15 DIAGNOSIS — K6389 Other specified diseases of intestine: Secondary | ICD-10-CM | POA: Diagnosis not present

## 2023-12-15 DIAGNOSIS — D649 Anemia, unspecified: Secondary | ICD-10-CM | POA: Diagnosis not present

## 2023-12-15 DIAGNOSIS — I5021 Acute systolic (congestive) heart failure: Secondary | ICD-10-CM | POA: Diagnosis not present

## 2023-12-15 DIAGNOSIS — K5730 Diverticulosis of large intestine without perforation or abscess without bleeding: Secondary | ICD-10-CM | POA: Diagnosis not present

## 2023-12-15 DIAGNOSIS — D509 Iron deficiency anemia, unspecified: Secondary | ICD-10-CM | POA: Diagnosis not present

## 2023-12-15 DIAGNOSIS — N83292 Other ovarian cyst, left side: Secondary | ICD-10-CM | POA: Diagnosis not present

## 2023-12-15 DIAGNOSIS — D508 Other iron deficiency anemias: Secondary | ICD-10-CM | POA: Diagnosis not present

## 2023-12-15 DIAGNOSIS — R590 Localized enlarged lymph nodes: Secondary | ICD-10-CM | POA: Diagnosis not present

## 2023-12-15 DIAGNOSIS — C189 Malignant neoplasm of colon, unspecified: Secondary | ICD-10-CM | POA: Diagnosis not present

## 2023-12-15 DIAGNOSIS — C183 Malignant neoplasm of hepatic flexure: Secondary | ICD-10-CM | POA: Diagnosis not present

## 2023-12-15 LAB — TYPE AND SCREEN
ABO/RH(D): A POS
Antibody Screen: POSITIVE
DAT, IgG: NEGATIVE
PT AG Type: NEGATIVE
Unit division: 0
Unit division: 0
Unit division: 0
Unit division: 0
Unit division: 0
Unit division: 0

## 2023-12-15 LAB — BPAM RBC
Blood Product Expiration Date: 202505232359
Blood Product Expiration Date: 202506122359
Blood Product Expiration Date: 202506122359
Blood Product Expiration Date: 202506172359
Blood Product Expiration Date: 202506182359
Blood Product Expiration Date: 202506182359
ISSUE DATE / TIME: 202505142014
ISSUE DATE / TIME: 202505150251
ISSUE DATE / TIME: 202505161019
ISSUE DATE / TIME: 202505161054
ISSUE DATE / TIME: 202505161332
ISSUE DATE / TIME: 202505161723
Unit Type and Rh: 6200
Unit Type and Rh: 6200
Unit Type and Rh: 6200
Unit Type and Rh: 6200
Unit Type and Rh: 6200
Unit Type and Rh: 6200

## 2023-12-15 LAB — BASIC METABOLIC PANEL WITH GFR
Anion gap: 10 (ref 5–15)
BUN: 6 mg/dL — ABNORMAL LOW (ref 8–23)
CO2: 20 mmol/L — ABNORMAL LOW (ref 22–32)
Calcium: 9.1 mg/dL (ref 8.9–10.3)
Chloride: 107 mmol/L (ref 98–111)
Creatinine, Ser: 1.08 mg/dL — ABNORMAL HIGH (ref 0.44–1.00)
GFR, Estimated: 58 mL/min — ABNORMAL LOW (ref >60)
Glucose, Bld: 95 mg/dL (ref 70–99)
Potassium: 3.9 mmol/L (ref 3.5–5.1)
Sodium: 137 mmol/L (ref 135–145)

## 2023-12-15 LAB — CBC
HCT: 29.6 % — ABNORMAL LOW (ref 36.0–46.0)
Hemoglobin: 8.9 g/dL — ABNORMAL LOW (ref 12.0–15.0)
MCH: 21.4 pg — ABNORMAL LOW (ref 26.0–34.0)
MCHC: 30.1 g/dL (ref 30.0–36.0)
MCV: 71.3 fL — ABNORMAL LOW (ref 80.0–100.0)
Platelets: 392 10*3/uL (ref 150–400)
RBC: 4.15 MIL/uL (ref 3.87–5.11)
RDW: 28.2 % — ABNORMAL HIGH (ref 11.5–15.5)
WBC: 7 10*3/uL (ref 4.0–10.5)
nRBC: 0 % (ref 0.0–0.2)

## 2023-12-15 MED ORDER — IRON SUCROSE 200 MG IVPB - SIMPLE MED
200.0000 mg | Freq: Once | Status: AC
  Administered 2023-12-15: 200 mg via INTRAVENOUS
  Filled 2023-12-15: qty 110

## 2023-12-15 MED ORDER — CARVEDILOL 3.125 MG PO TABS
3.1250 mg | ORAL_TABLET | Freq: Two times a day (BID) | ORAL | Status: DC
  Administered 2023-12-15 – 2023-12-16 (×2): 3.125 mg via ORAL
  Filled 2023-12-15 (×2): qty 1

## 2023-12-15 MED ORDER — IOPAMIDOL (ISOVUE-370) INJECTION 76%
75.0000 mL | Freq: Once | INTRAVENOUS | Status: AC | PRN
  Administered 2023-12-15: 75 mL via INTRAVENOUS

## 2023-12-15 MED ORDER — ENOXAPARIN SODIUM 40 MG/0.4ML IJ SOSY
40.0000 mg | PREFILLED_SYRINGE | INTRAMUSCULAR | Status: DC
  Administered 2023-12-15: 40 mg via SUBCUTANEOUS
  Filled 2023-12-15: qty 0.4

## 2023-12-15 MED ORDER — ENSURE ENLIVE PO LIQD
237.0000 mL | Freq: Two times a day (BID) | ORAL | Status: DC
  Administered 2023-12-15 – 2023-12-22 (×8): 237 mL via ORAL

## 2023-12-15 MED ORDER — POTASSIUM CHLORIDE CRYS ER 20 MEQ PO TBCR
20.0000 meq | EXTENDED_RELEASE_TABLET | Freq: Once | ORAL | Status: AC
  Administered 2023-12-15: 20 meq via ORAL
  Filled 2023-12-15: qty 1

## 2023-12-15 MED ORDER — MAGNESIUM SULFATE IN D5W 1-5 GM/100ML-% IV SOLN
1.0000 g | Freq: Once | INTRAVENOUS | Status: AC
  Administered 2023-12-15: 1 g via INTRAVENOUS
  Filled 2023-12-15 (×2): qty 100

## 2023-12-15 NOTE — Progress Notes (Signed)
 PROGRESS NOTE    Leah Hernandez  WJX:[id number] DOB: Jan 01, 1961 DOA: 12/12/2023 PCP: Collins Dean, NP   Chief Complaint  Patient presents with   fainted yesterday    Brief Narrative:   Leah Hernandez is a 63 y.o. female with no medical history no regular follow-up presented, she went to PCP for presyncope, workup in ED significant for anemia, globin of 5.1, she went for endoscopy, workup significant for colon mass s/p biopsy, please see discussion below.   Assessment & Plan:   Principal Problem:   Symptomatic anemia Active Problems:   Iron deficiency anemia   Loss of weight   Polyp of colon  Chronic blood loss anemia/symptomatic Lower GI bleed Transverse colon mass concerning for malignancy Iron deficiency anemia B12 deficiency Weight loss Gastritis - Workup significant iron deficiency anemia, B12, patient never had colonoscopy before, with significant weight loss, so concern for malignancy. - GI input greatly appreciated, status post endoscopy and colonoscopy 5/16, workup significant for transverse colon mass concerning for malignancy s/p biopsy, endoscopy significant for gastritis. - Follow on biopsy results, - Concerning for malignancy, so CT chest/abdomen/pelvis has been obtained, significant for hepatic flexure mass with surrounding mesenteric lymphadenopathy, otherwise no evidence of metastasis . - General Surgery consulted as likely will need surgical intervention . - Discussed with oncology, will obtain CEA, Dr. Randye Buttner will arrange for outpatient follow-up versus routine nonurgent consult . -Will start on IV iron. - Hemoglobin remained stable posttransfusion, continue to monitor closely - Endoscopy significant for gastritis follow-up on biopsy, continue with PPI  Renal insufficiency - No clear baseline, avoid nephrotoxic medications  NSVT -patient had 12 beats of NSVT yesterday, symptomatic, target magnesium more than 2, potassium more than 4, - Obtain  2D echo - Will start on low-dose Coreg  Left ovarian cyst - Benign appearing, will need Will need repeat ultrasound to 26-month per radiology recommendation  Asymptomatic bacteriuria - He denies any symptoms, no indication to treat    DVT prophylaxis: SCD, she was encouraged to ambulate  code Status: Full code Family Communication: None at bedside, I have offered to update family, but she reports for now she would like to update them herself, and told her I will be happy to do so whenever she wants me to update them. Disposition:   Status is: Inpatient    Consultants:  Gastroenterology General Surgery Cussed with oncology by phone   Subjective: No nausea, no vomiting, reports she is feeling better today  Objective: Vitals:   12/14/23 2009 12/14/23 2326 12/15/23 0308 12/15/23 0744  BP: 109/66 104/65 103/64 107/69  Pulse:  (!) 104  94  Resp:  16 16   Temp: 97.7 F (36.5 C) 98.4 F (36.9 C) 98.5 F (36.9 C) 98.7 F (37.1 C)  TempSrc: Oral Oral Axillary Oral  SpO2: 99% 100%  99%  Weight:      Height:        Intake/Output Summary (Last 24 hours) at 12/15/2023 1318 Last data filed at 12/15/2023 0600 Gross per 24 hour  Intake 480 ml  Output --  Net 480 ml   Filed Weights   12/12/23 1642  Weight: 106.9 kg    Examination:   Awake Alert, Oriented X 3, No new F.N deficits, Normal affect Symmetrical Chest wall movement, Good air movement bilaterally, CTAB RRR,No Gallops,Rubs or new Murmurs, No Parasternal Heave +ve B.Sounds, Abd Soft, No tenderness, No rebound - guarding or rigidity. No Cyanosis, Clubbing or edema, No new Rash or bruise  Data Reviewed: I have personally reviewed following labs and imaging studies  CBC: Recent Labs  Lab 12/12/23 1642 12/13/23 0607 12/13/23 1638 12/14/23 0449 12/14/23 1756 12/15/23 0445  WBC 6.3 6.2  --   --   --  7.0  NEUTROABS 3.7  --   --   --   --   --   HGB 5.1* 6.9* 7.4* 7.0* 8.8* 8.9*  HCT 18.7* 22.7*  25.3* 23.4* 28.6* 29.6*  MCV 62.5* 67.8*  --   --   --  71.3*  PLT 512* 406*  --   --   --  392    Basic Metabolic Panel: Recent Labs  Lab 12/12/23 1642 12/12/23 2052 12/13/23 0607 12/14/23 0449 12/14/23 1756 12/15/23 0445  NA 132* 133* 138 138  --  137  K 3.8 3.7 4.0 3.8  --  3.9  CL 104 105 111 108  --  107  CO2 20* 20* 22 22  --  20*  GLUCOSE 105* 102* 99 95  --  95  BUN 14 12 9  6*  --  6*  CREATININE 1.08* 1.08* 1.05* 1.06*  --  1.08*  CALCIUM 8.6* 8.5* 8.9 8.8*  --  9.1  MG  --   --   --   --  1.8  --     GFR: Estimated Creatinine Clearance: 67.1 mL/min (A) (by C-G formula based on SCr of 1.08 mg/dL (H)).  Liver Function Tests: Recent Labs  Lab 12/12/23 2052  AST 21  ALT 11  ALKPHOS 82  BILITOT 0.5  PROT 6.8  ALBUMIN 2.8*    CBG: Recent Labs  Lab 12/14/23 0727  GLUCAP 85     No results found for this or any previous visit (from the past 240 hours).       Radiology Studies: CT CHEST ABDOMEN PELVIS W CONTRAST Result Date: 12/15/2023 CLINICAL DATA:  Newly diagnosed colon carcinoma. Staging. * Tracking Code: BO * EXAM: CT CHEST, ABDOMEN, AND PELVIS WITH CONTRAST TECHNIQUE: Multidetector CT imaging of the chest, abdomen and pelvis was performed following the standard protocol during bolus administration of intravenous contrast. RADIATION DOSE REDUCTION: This exam was performed according to the departmental dose-optimization program which includes automated exposure control, adjustment of the mA and/or kV according to patient size and/or use of iterative reconstruction technique. CONTRAST:  75mL ISOVUE-370 IOPAMIDOL (ISOVUE-370) INJECTION 76% COMPARISON:  None Available. FINDINGS: CT CHEST FINDINGS Cardiovascular: No acute findings. Mediastinum/Lymph Nodes: No masses or pathologically enlarged lymph nodes identified. Lungs/Pleura: No suspicious pulmonary nodules or masses identified. No evidence of infiltrate or pleural effusion. Musculoskeletal:  No suspicious  bone lesions identified. CT ABDOMEN AND PELVIS FINDINGS Hepatobiliary: No masses identified. Gallbladder is unremarkable. No evidence of biliary ductal dilatation. Pancreas:  No mass or inflammatory changes. Spleen:  Within normal limits in size and appearance. Adrenals/Urinary tract: No suspicious masses or hydronephrosis. Bilateral benign renal sinus cysts noted. Stomach/Bowel: Bulky annular constricting mass is seen in the hepatic flexure of the colon, measuring 6.7 by 4.6 cm, consistent with colon carcinoma. No evidence of bowel obstruction. Diverticulosis is seen mainly involving the sigmoid colon, however there is no evidence of diverticulitis. Vascular/Lymphatic: Pericolonic and right upper quadrant mesenteric lymphadenopathy is seen with lymph nodes measuring up to 10 mm, consistent with metastatic disease. No acute vascular findings. Reproductive: Uterus is unremarkable. Simple left ovarian cyst is seen measuring 3.3 cm. No No evidence of inflammatory process or ascites. Other:  None. Musculoskeletal:  No suspicious bone lesions identified. IMPRESSION:  Bulky annular constricting mass in the hepatic flexure of the colon, consistent with colon carcinoma. Pericolonic and right upper quadrant mesenteric lymphadenopathy, consistent with metastatic disease. No other sites of metastatic disease identified. 3.3 cm benign-appearing left ovarian cyst. Recommend follow-up US  in 6-12 months. Reference: JACR 2020 Feb; 17(2):248-254 Sigmoid diverticulosis, without radiographic evidence of diverticulitis. Electronically Signed   By: Marlyce Sine M.D.   On: 12/15/2023 12:00        Scheduled Meds:  sodium chloride    Intravenous Once   carvedilol  3.125 mg Oral BID WC   cyanocobalamin   1,000 mcg Subcutaneous Daily   pantoprazole   40 mg Oral BID   potassium chloride   20 mEq Oral Once   Continuous Infusions:  magnesium sulfate bolus IVPB       LOS: 3 days     Seena Dadds, MD [facility]   To contact the attending provider between 7A-7P or the covering provider during after hours 7P-7A, please log into the web site [email or web address] and access using universal [place] password for that web site. If you do not have the password, please call the hospital operator.  12/15/2023, 1:18 PM

## 2023-12-15 NOTE — Plan of Care (Signed)
 Pt has rested quietly throughout the night with no distress noted. Alert and oriented. On room air. SR on the monitor. Up to BR to void with standby assist. No complaints voiced.     Problem: Education: Goal: Knowledge of General Education information will improve Description: Including pain rating scale, medication(s)/side effects and non-pharmacologic comfort measures Outcome: Progressing   Problem: Health Behavior/Discharge Planning: Goal: Ability to manage health-related needs will improve Outcome: Progressing   Problem: Clinical Measurements: Goal: Cardiovascular complication will be avoided Outcome: Progressing   Problem: Activity: Goal: Risk for activity intolerance will decrease Outcome: Progressing   Problem: Pain Managment: Goal: General experience of comfort will improve and/or be controlled Outcome: Progressing

## 2023-12-15 NOTE — Consult Note (Addendum)
 CC: I was weak  Requesting provider: Dr Osborne Blazer  HPI: Leah Hernandez is an 63 y.o. female who was admitted for lightheadedness and upon evaluation she was found to have significant anemia with a hemoglobin of 5.7 and was admitted to the hospital.  She underwent additional labs and evaluation.  She was transfused with 2 units of blood initially.  She was found to have severe iron deficiency anemia.  She had never had prior upper or lower endoscopies.  She underwent upper endoscopy and colonoscopy.  She was found to have a mass in the proximal transverse colon on colonoscopy.  She had 2 other polyps removed 1 in the ascending colon and 1 in the rectum.  The lesion was concerning for malignancy based on appearance.  She underwent CT scan of her chest abdomen pelvis which demonstrated the mass in the hepatic flexure with some associated lymphadenopathy in the corresponding mesentery.  We were asked to evaluate the patient for same hospital partial colectomy since the patient had already had a bowel prep for her colonoscopy.  Patient had been feeling lightheaded and shortness of breath for some time.  She denied any overt melena or hematochezia.  She endorsed some weight loss of about 30 pounds over the past 3 months which was unintentional associate with some anorexia.  No prior abdominal surgery except for tubal ligation.  No personal history of any type of colon cancer screening and no family history of colon cancer.   Past Medical History:  Diagnosis Date   Prediabetes     Past Surgical History:  Procedure Laterality Date   TUBAL LIGATION      Family History  Problem Relation Age of Onset   Hypertension Neg Hx     Social:  reports that she has never smoked. She has never used smokeless tobacco. She reports that she does not drink alcohol and does not use drugs.  Allergies: No Known Allergies  Medications: I have reviewed the patient's current medications.   ROS - all of the  below systems have been reviewed with the patient and positives are indicated with bold text General: chills, fever or night sweats Eyes: blurry vision or double vision ENT: epistaxis or sore throat Allergy/Immunology: itchy/watery eyes or nasal congestion Hematologic/Lymphatic: bleeding problems, blood clots or swollen lymph nodes Endocrine: temperature intolerance or unexpected weight changes Breast: new or changing breast lumps or nipple discharge Resp: cough, shortness of breath, or wheezing CV: chest pain or dyspnea on exertion GI: as per HPI GU: dysuria, trouble voiding, or hematuria MSK: joint pain or joint stiffness Neuro: TIA or stroke symptoms Derm: pruritus and skin lesion changes Psych: anxiety and depression  PE Blood pressure 107/69, pulse 94, temperature 98.7 F (37.1 C), temperature source Oral, resp. rate 16, height 5\' 7"  (1.702 m), weight 106.9 kg, SpO2 99%. Constitutional: NAD; conversant; no deformities;class 2 obesity Eyes: Moist conjunctiva; no lid lag; anicteric; PERRL Neck: Trachea midline; no thyromegaly Lungs: Normal respiratory effort; no tactile fremitus CV: RRR; no palpable thrills; no pitting edema GI: Abd soft, nontender, mild obesity; no palpable hepatosplenomegaly MSK: Normal gait; no clubbing/cyanosis Psychiatric: Appropriate affect; alert and oriented x3 Lymphatic: No palpable cervical or axillary lymphadenopathy Skin: No rash, lesions or jaundice  Results for orders placed or performed during the hospital encounter of 12/12/23 (from the past 48 hours)  Hemoglobin and hematocrit, blood     Status: Abnormal   Collection Time: 12/13/23  4:38 PM  Result Value Ref Range   Hemoglobin 7.4 (  L) 12.0 - 15.0 g/dL   HCT 82.9 (L) 56.2 - 13.0 %    Comment: Performed at [facility], [address] [place], [place] [address]  Hemoglobin and hematocrit, blood     Status: Abnormal   Collection Time: 12/14/23  4:49 AM  Result Value Ref Range    Hemoglobin 7.0 (L) 12.0 - 15.0 g/dL   HCT 46.9 (L) 62.9 - 52.8 %    Comment: Performed at [facility], [address], [place], [place] [address]  Basic metabolic panel with GFR     Status: Abnormal   Collection Time: 12/14/23  4:49 AM  Result Value Ref Range   Sodium 138 135 - 145 mmol/L   Potassium 3.8 3.5 - 5.1 mmol/L   Chloride 108 98 - 111 mmol/L   CO2 22 22 - 32 mmol/L   Glucose, Bld 95 70 - 99 mg/dL    Comment: Glucose reference range applies only to samples taken after fasting for at least 8 hours.   BUN 6 (L) 8 - 23 mg/dL   Creatinine, Ser 4.01 (H) 0.44 - 1.00 mg/dL   Calcium 8.8 (L) 8.9 - 10.3 mg/dL   GFR, Estimated 59 (L) >60 mL/min    Comment: (NOTE) Calculated using the CKD-EPI Creatinine Equation (2021)    Anion gap 8 5 - 15    Comment: Performed at [facility], [address], [place], [place] [address]  Prepare RBC (crossmatch)     Status: None   Collection Time: 12/14/23  6:57 AM  Result Value Ref Range   Order Confirmation      ORDER PROCESSED BY BLOOD BANK Performed at [facility], [address] [place], [place] [address]   Glucose, capillary     Status: None   Collection Time: 12/14/23  7:27 AM  Result Value Ref Range   Glucose-Capillary 85 70 - 99 mg/dL    Comment: Glucose reference range applies only to samples taken after fasting for at least 8 hours.  Hemoglobin and hematocrit, blood     Status: Abnormal   Collection Time: 12/14/23  5:56 PM  Result Value Ref Range   Hemoglobin 8.8 (L) 12.0 - 15.0 g/dL    Comment: REPEATED TO VERIFY POST TRANSFUSION SPECIMEN    HCT 28.6 (L) 36.0 - 46.0 %    Comment: Performed at [facility], [address] [place], [place] [address]  Magnesium     Status: None   Collection Time: 12/14/23  5:56 PM  Result Value Ref Range   Magnesium 1.8 1.7 - 2.4 mg/dL    Comment: Performed at [facility], [address], [place], [place] [address]  CBC     Status: Abnormal   Collection Time:  12/15/23  4:45 AM  Result Value Ref Range   WBC 7.0 4.0 - 10.5 K/uL   RBC 4.15 3.87 - 5.11 MIL/uL   Hemoglobin 8.9 (L) 12.0 - 15.0 g/dL    Comment: Reticulocyte Hemoglobin testing may be clinically indicated, consider ordering this additional test [id number]    HCT 29.6 (L) 36.0 - 46.0 %   MCV 71.3 (L) 80.0 - 100.0 fL   MCH 21.4 (L) 26.0 - 34.0 pg   MCHC 30.1 30.0 - 36.0 g/dL   RDW 29.5 (H) 18.8 - 41.6 %   Platelets 392 150 - 400 K/uL   nRBC 0.0 0.0 - 0.2 %    Comment: Performed at [facility]  Lab, [address], [place], [place] [address]  Basic metabolic panel with GFR     Status: Abnormal   Collection Time: 12/15/23  4:45 AM  Result Value Ref Range   Sodium 137 135 - 145 mmol/L   Potassium 3.9 3.5 - 5.1 mmol/L   Chloride 107 98 - 111 mmol/L   CO2 20 (L) 22 - 32 mmol/L   Glucose, Bld 95 70 - 99 mg/dL    Comment: Glucose reference range applies only to samples taken after fasting for at least 8 hours.   BUN 6 (L) 8 - 23 mg/dL   Creatinine, Ser 6.04 (H) 0.44 - 1.00 mg/dL   Calcium 9.1 8.9 - 54.0 mg/dL   GFR, Estimated 58 (L) >60 mL/min    Comment: (NOTE) Calculated using the CKD-EPI Creatinine Equation (2021)    Anion gap 10 5 - 15    Comment: Performed at [facility], [address], [place], [place] [address]    CT CHEST ABDOMEN PELVIS W CONTRAST Result Date: 12/15/2023 CLINICAL DATA:  Newly diagnosed colon carcinoma. Staging. * Tracking Code: BO * EXAM: CT CHEST, ABDOMEN, AND PELVIS WITH CONTRAST TECHNIQUE: Multidetector CT imaging of the chest, abdomen and pelvis was performed following the standard protocol during bolus administration of intravenous contrast. RADIATION DOSE REDUCTION: This exam was performed according to the departmental dose-optimization program which includes automated exposure control, adjustment of the mA and/or kV according to patient size and/or use of iterative reconstruction technique. CONTRAST:  75mL ISOVUE-370 IOPAMIDOL (ISOVUE-370)  INJECTION 76% COMPARISON:  None Available. FINDINGS: CT CHEST FINDINGS Cardiovascular: No acute findings. Mediastinum/Lymph Nodes: No masses or pathologically enlarged lymph nodes identified. Lungs/Pleura: No suspicious pulmonary nodules or masses identified. No evidence of infiltrate or pleural effusion. Musculoskeletal:  No suspicious bone lesions identified. CT ABDOMEN AND PELVIS FINDINGS Hepatobiliary: No masses identified. Gallbladder is unremarkable. No evidence of biliary ductal dilatation. Pancreas:  No mass or inflammatory changes. Spleen:  Within normal limits in size and appearance. Adrenals/Urinary tract: No suspicious masses or hydronephrosis. Bilateral benign renal sinus cysts noted. Stomach/Bowel: Bulky annular constricting mass is seen in the hepatic flexure of the colon, measuring 6.7 by 4.6 cm, consistent with colon carcinoma. No evidence of bowel obstruction. Diverticulosis is seen mainly involving the sigmoid colon, however there is no evidence of diverticulitis. Vascular/Lymphatic: Pericolonic and right upper quadrant mesenteric lymphadenopathy is seen with lymph nodes measuring up to 10 mm, consistent with metastatic disease. No acute vascular findings. Reproductive: Uterus is unremarkable. Simple left ovarian cyst is seen measuring 3.3 cm. No No evidence of inflammatory process or ascites. Other:  None. Musculoskeletal:  No suspicious bone lesions identified. IMPRESSION: Bulky annular constricting mass in the hepatic flexure of the colon, consistent with colon carcinoma. Pericolonic and right upper quadrant mesenteric lymphadenopathy, consistent with metastatic disease. No other sites of metastatic disease identified. 3.3 cm benign-appearing left ovarian cyst. Recommend follow-up US  in 6-12 months. Reference: JACR 2020 Feb; 17(2):248-254 Sigmoid diverticulosis, without radiographic evidence of diverticulitis. Electronically Signed   By: Marlyce Sine M.D.   On: 12/15/2023 12:00     Imaging: Personally reviewed  A/P: LAJOYCE TAMURA is an 63 y.o. female with  Hepatic flexure mass concerning for malignancy with surrounding mesenteric lymphadenopathy Iron deficiency anemia status posttransfusion Left ovarian cyst NSVT Prediabetes Class II obesity  Agree with primary team that patient would probably benefit from same hospitalization partial colectomy No evidence of metastatic disease to the lungs or adjacent abdominal structures except for concerns  for lymphadenopathy in some of the adjoining mesentery  Agree with checking echocardiogram and CEA; maintain type and cross  Patient may stay on full liquid diet with protein shakes to maintain nutrition Tentative plan for lap assisted partial colectomy on Monday -did discuss that it could be until Tuesday depending on OR schedule  I would recommend empiric VTE prophylaxis given her probable underlying malignancy and trend hemoglobins if evidence of bleeding can stop  I discussed the procedure and disease process in detail.  I drew a diagram.  We discussed the risks and benefits of surgery including, but not limited to bleeding, infection (such as wound infection, abdominal abscess), injury to surrounding structures, blood clot formation, urinary retention, incisional hernia, anastomotic stricture, anastomotic leak, anesthesia risks, pulmonary & cardiac complications such as pneumonia &/or heart attack, need for additional procedures, ileus, & prolonged hospitalization.  We discussed the typical postoperative recovery course, including limitations & restrictions postoperatively. I explained that the likelihood of improvement in their symptoms is good.  Data reviewed: I reviewed H&P, consultant notes, upper endoscopy, colonoscopy, CT scan of her chest abdomen pelvis, labs since admission, vital signs for the past 48 hours; discussed case with hospitalist  High medical decision making  Marianna Shirk. Elvan Hamel, MD, FACS General,  Bariatric, & Minimally Invasive Surgery [facility]

## 2023-12-15 NOTE — Plan of Care (Signed)

## 2023-12-16 ENCOUNTER — Inpatient Hospital Stay (HOSPITAL_COMMUNITY)

## 2023-12-16 ENCOUNTER — Encounter (HOSPITAL_COMMUNITY): Payer: Self-pay | Admitting: Internal Medicine

## 2023-12-16 DIAGNOSIS — R000 Tachycardia, unspecified: Secondary | ICD-10-CM

## 2023-12-16 DIAGNOSIS — R7303 Prediabetes: Secondary | ICD-10-CM

## 2023-12-16 DIAGNOSIS — I420 Dilated cardiomyopathy: Secondary | ICD-10-CM | POA: Diagnosis not present

## 2023-12-16 DIAGNOSIS — D649 Anemia, unspecified: Secondary | ICD-10-CM | POA: Diagnosis not present

## 2023-12-16 DIAGNOSIS — I5021 Acute systolic (congestive) heart failure: Secondary | ICD-10-CM | POA: Diagnosis not present

## 2023-12-16 DIAGNOSIS — C183 Malignant neoplasm of hepatic flexure: Secondary | ICD-10-CM | POA: Diagnosis not present

## 2023-12-16 DIAGNOSIS — K6389 Other specified diseases of intestine: Secondary | ICD-10-CM | POA: Diagnosis not present

## 2023-12-16 DIAGNOSIS — I429 Cardiomyopathy, unspecified: Secondary | ICD-10-CM | POA: Diagnosis not present

## 2023-12-16 DIAGNOSIS — E66812 Obesity, class 2: Secondary | ICD-10-CM

## 2023-12-16 DIAGNOSIS — D508 Other iron deficiency anemias: Secondary | ICD-10-CM | POA: Diagnosis not present

## 2023-12-16 DIAGNOSIS — D509 Iron deficiency anemia, unspecified: Secondary | ICD-10-CM | POA: Diagnosis not present

## 2023-12-16 LAB — ECHOCARDIOGRAM COMPLETE
AR max vel: 2.98 cm2
AV Area VTI: 2.97 cm2
AV Area mean vel: 3.01 cm2
AV Mean grad: 3.3 mmHg
AV Peak grad: 6.5 mmHg
Ao pk vel: 1.27 m/s
Area-P 1/2: 5.13 cm2
Height: 67 in
MV M vel: 4.68 m/s
MV Peak grad: 87.6 mmHg
Radius: 0.6 cm
S' Lateral: 5.1 cm
Weight: 3770.75 [oz_av]

## 2023-12-16 LAB — CBC
HCT: 28.6 % — ABNORMAL LOW (ref 36.0–46.0)
Hemoglobin: 8.8 g/dL — ABNORMAL LOW (ref 12.0–15.0)
MCH: 21.8 pg — ABNORMAL LOW (ref 26.0–34.0)
MCHC: 30.8 g/dL (ref 30.0–36.0)
MCV: 70.8 fL — ABNORMAL LOW (ref 80.0–100.0)
Platelets: 413 10*3/uL — ABNORMAL HIGH (ref 150–400)
RBC: 4.04 MIL/uL (ref 3.87–5.11)
RDW: 29.2 % — ABNORMAL HIGH (ref 11.5–15.5)
WBC: 6.1 10*3/uL (ref 4.0–10.5)
nRBC: 0 % (ref 0.0–0.2)

## 2023-12-16 LAB — BASIC METABOLIC PANEL WITH GFR
Anion gap: 8 (ref 5–15)
BUN: 6 mg/dL — ABNORMAL LOW (ref 8–23)
CO2: 22 mmol/L (ref 22–32)
Calcium: 8.9 mg/dL (ref 8.9–10.3)
Chloride: 106 mmol/L (ref 98–111)
Creatinine, Ser: 1.1 mg/dL — ABNORMAL HIGH (ref 0.44–1.00)
GFR, Estimated: 56 mL/min — ABNORMAL LOW (ref >60)
Glucose, Bld: 84 mg/dL (ref 70–99)
Potassium: 4.3 mmol/L (ref 3.5–5.1)
Sodium: 136 mmol/L (ref 135–145)

## 2023-12-16 LAB — HEMOGLOBIN A1C
Hgb A1c MFr Bld: 5.4 % (ref 4.8–5.6)
Mean Plasma Glucose: 108.28 mg/dL

## 2023-12-16 MED ORDER — ALVIMOPAN 12 MG PO CAPS
12.0000 mg | ORAL_CAPSULE | ORAL | Status: DC
  Filled 2023-12-16: qty 1

## 2023-12-16 MED ORDER — NEOMYCIN SULFATE 500 MG PO TABS
1000.0000 mg | ORAL_TABLET | ORAL | Status: DC
  Filled 2023-12-16 (×3): qty 2

## 2023-12-16 MED ORDER — BUPIVACAINE LIPOSOME 1.3 % IJ SUSP
20.0000 mL | Freq: Once | INTRAMUSCULAR | Status: DC
  Filled 2023-12-16: qty 20

## 2023-12-16 MED ORDER — ENSURE PRE-SURGERY PO LIQD
592.0000 mL | Freq: Once | ORAL | Status: DC
  Filled 2023-12-16: qty 592

## 2023-12-16 MED ORDER — GABAPENTIN 100 MG PO CAPS: 200.0000 mg | ORAL_CAPSULE | ORAL | Status: DC

## 2023-12-16 MED ORDER — METOPROLOL TARTRATE 25 MG PO TABS
25.0000 mg | ORAL_TABLET | Freq: Two times a day (BID) | ORAL | Status: DC
  Administered 2023-12-16 – 2023-12-20 (×4): 25 mg via ORAL
  Filled 2023-12-16 (×6): qty 1

## 2023-12-16 MED ORDER — ENSURE PRE-SURGERY PO LIQD
296.0000 mL | Freq: Once | ORAL | Status: DC
  Filled 2023-12-16: qty 296

## 2023-12-16 MED ORDER — SODIUM CHLORIDE 0.9 % IV SOLN
2.0000 g | INTRAVENOUS | Status: DC
  Filled 2023-12-16: qty 2

## 2023-12-16 MED ORDER — ACETAMINOPHEN 500 MG PO TABS: 1000.0000 mg | ORAL_TABLET | ORAL | Status: DC

## 2023-12-16 MED ORDER — BUPIVACAINE LIPOSOME 1.3 % IJ SUSP: 266.0000 mg | Freq: Once | INTRAMUSCULAR | Status: DC

## 2023-12-16 MED ORDER — METRONIDAZOLE 500 MG PO TABS: 1000.0000 mg | ORAL_TABLET | ORAL | Status: DC

## 2023-12-16 MED ORDER — ENOXAPARIN SODIUM 40 MG/0.4ML IJ SOSY: 40.0000 mg | PREFILLED_SYRINGE | Freq: Once | INTRAMUSCULAR | Status: DC

## 2023-12-16 NOTE — Progress Notes (Signed)
 2 Days Post-Op   Subjective/Chief Complaint: No acute overnight events   Objective: Vital signs in last 24 hours: Temp:  [98 F (36.7 C)-98.2 F (36.8 C)] 98.1 F (36.7 C) (05/18 0348) Pulse Rate:  [96-102] 96 (05/18 1027) Resp:  [16-17] 17 (05/18 0348) BP: (95-118)/(55-84) 111/63 (05/18 1027) SpO2:  [95 %-100 %] 100 % (05/18 1027) Last BM Date : 12/13/23  Intake/Output from previous day: 05/17 0701 - 05/18 0700 In: 100 [IV Piggyback:100] Out: -  Intake/Output this shift: Total I/O In: 240 [P.O.:240] Out: -   Alert and oriented Getting echo  Lab Results:  Recent Labs    12/15/23 0445 12/16/23 0700  WBC 7.0 6.1  HGB 8.9* 8.8*  HCT 29.6* 28.6*  PLT 392 413*   BMET Recent Labs    12/15/23 0445 12/16/23 0700  NA 137 136  K 3.9 4.3  CL 107 106  CO2 20* 22  GLUCOSE 95 84  BUN 6* 6*  CREATININE 1.08* 1.10*  CALCIUM 9.1 8.9   PT/INR No results for input(s): "LABPROT", "INR" in the last 72 hours. ABG No results for input(s): "PHART", "HCO3" in the last 72 hours.  Invalid input(s): "PCO2", "PO2"  Studies/Results: ECHOCARDIOGRAM COMPLETE Result Date: 12/16/2023    ECHOCARDIOGRAM REPORT   Patient Name:   Leah Hernandez Date of Exam: 12/16/2023 Medical Rec #:  [id number]         Height:       67.0 in Accession #:    [id number]        Weight:       235.7 lb Date of Birth:  Oct 23, 1960         BSA:          2.168 m Patient Age:    63 years          BP:           111/63 mmHg Patient Gender: F                 HR:           97 bpm. Exam Location:  Inpatient Procedure: 2D Echo, 3D Echo, Cardiac Doppler, Color Doppler and Strain Analysis            (Both Spectral and Color Flow Doppler were utilized during            procedure). Indications:    NSVT (nonsustained ventricular tachycardia)  History:        Patient has no prior history of Echocardiogram examinations.                 Risk Factors:Prediabetes.  Sonographer:    Travis Friedman RDCS Referring Phys: 30 DAWOOD S  ELGERGAWY IMPRESSIONS  1. Left ventricular ejection fraction, by estimation, is 20 to 25%. Left ventricular ejection fraction by 3D volume is 38 %. The left ventricle has severely decreased function. The left ventricle demonstrates global hypokinesis. The left ventricular internal cavity size was moderately to severely dilated. There is severe asymmetric left ventricular hypertrophy of the lateral segment. Left ventricular diastolic parameters are consistent with Grade I diastolic dysfunction (impaired relaxation). The average left ventricular global longitudinal strain is -9.4 %. The global longitudinal strain is abnormal.  2. Right ventricular systolic function is normal. The right ventricular size is normal. There is normal pulmonary artery systolic pressure.  3. The mitral valve is normal in structure. No evidence of mitral valve regurgitation. No evidence of mitral stenosis.  4. The aortic valve is  tricuspid. Aortic valve regurgitation is not visualized. No aortic stenosis is present.  5. The inferior vena cava is normal in size with greater than 50% respiratory variability, suggesting right atrial pressure of 3 mmHg. Comparison(s): No prior Echocardiogram. FINDINGS  Left Ventricle: Left ventricular ejection fraction, by estimation, is 20 to 25%. Left ventricular ejection fraction by 3D volume is 38 %. The left ventricle has severely decreased function. The left ventricle demonstrates global hypokinesis. The average  left ventricular global longitudinal strain is -9.4 %. Strain was performed and the global longitudinal strain is abnormal. The left ventricular internal cavity size was moderately to severely dilated. There is severe asymmetric left ventricular hypertrophy of the lateral segment. Left ventricular diastolic parameters are consistent with Grade I diastolic dysfunction (impaired relaxation). Right Ventricle: The right ventricular size is normal. No increase in right ventricular wall thickness. Right  ventricular systolic function is normal. There is normal pulmonary artery systolic pressure. The tricuspid regurgitant velocity is 2.48 m/s, and  with an assumed right atrial pressure of 3 mmHg, the estimated right ventricular systolic pressure is 27.6 mmHg. Left Atrium: Left atrial size was normal in size. Right Atrium: Right atrial size was normal in size. Pericardium: There is no evidence of pericardial effusion. Mitral Valve: The mitral valve is normal in structure. No evidence of mitral valve regurgitation. No evidence of mitral valve stenosis. Tricuspid Valve: The tricuspid valve is normal in structure. Tricuspid valve regurgitation is mild . No evidence of tricuspid stenosis. Aortic Valve: The aortic valve is tricuspid. Aortic valve regurgitation is not visualized. No aortic stenosis is present. Aortic valve mean gradient measures 3.3 mmHg. Aortic valve peak gradient measures 6.5 mmHg. Aortic valve area, by VTI measures 2.97 cm. Pulmonic Valve: The pulmonic valve was not well visualized. Pulmonic valve regurgitation is not visualized. No evidence of pulmonic stenosis. Aorta: The aortic root is normal in size and structure. Venous: The inferior vena cava is normal in size with greater than 50% respiratory variability, suggesting right atrial pressure of 3 mmHg. IAS/Shunts: No atrial level shunt detected by color flow Doppler. Additional Comments: 3D was performed not requiring image post processing on an independent workstation and was indeterminate.  LEFT VENTRICLE PLAX 2D LVIDd:         6.20 cm         Diastology LVIDs:         5.10 cm         LV e' medial:    8.92 cm/s LV PW:         1.50 cm         LV E/e' medial:  11.8 LV IVS:        0.70 cm         LV e' lateral:   8.38 cm/s LVOT diam:     2.50 cm         LV E/e' lateral: 12.5 LV SV:         68 LV SV Index:   31              2D Longitudinal LVOT Area:     4.91 cm        Strain                                2D Strain GLS   -9.4 %  Avg:                                 3D Volume EF                                LV 3D EF:    Left                                             ventricul                                             ar                                             ejection                                             fraction                                             by 3D                                             volume is                                             38 %.                                 3D Volume EF:                                3D EF:        38 %                                LV EDV:       157 ml                                LV ESV:       98 ml                                LV SV:        59 ml RIGHT VENTRICLE  IVC RV Basal diam:  3.20 cm     IVC diam: 1.70 cm RV S prime:     13.50 cm/s TAPSE (M-mode): 2.2 cm LEFT ATRIUM           Index        RIGHT ATRIUM           Index LA diam:      4.50 cm 2.08 cm/m   RA Area:     16.90 cm LA Vol (A2C): 48.8 ml 22.51 ml/m  RA Volume:   40.60 ml  18.72 ml/m LA Vol (A4C): 73.5 ml 33.90 ml/m  AORTIC VALVE AV Area (Vmax):    2.98 cm AV Area (Vmean):   3.01 cm AV Area (VTI):     2.97 cm AV Vmax:           127.33 cm/s AV Vmean:          86.533 cm/s AV VTI:            0.228 m AV Peak Grad:      6.5 mmHg AV Mean Grad:      3.3 mmHg LVOT Vmax:         77.20 cm/s LVOT Vmean:        53.133 cm/s LVOT VTI:          0.138 m LVOT/AV VTI ratio: 0.61  AORTA Ao Root diam: 3.60 cm Ao Asc diam:  3.10 cm MITRAL VALVE                  TRICUSPID VALVE MV Area (PHT): 5.13 cm       TR Peak grad:   24.6 mmHg MV Decel Time: 148 msec       TR Vmax:        248.00 cm/s MR Peak grad:    87.6 mmHg MR Mean grad:    60.0 mmHg    SHUNTS MR Vmax:         468.00 cm/s  Systemic VTI:  0.14 m MR Vmean:        362.0 cm/s   Systemic Diam: 2.50 cm MR PISA:         2.26 cm MR PISA Eff ROA: 19 mm MR PISA Radius:  0.60 cm MV E velocity: 105.00 cm/s MV A velocity: 125.00 cm/s MV E/A ratio:   0.84 Vishnu Priya Mallipeddi Electronically signed by Lucetta Russel Mallipeddi Signature Date/Time: 12/16/2023/1:38:39 PM    Final    CT CHEST ABDOMEN PELVIS W CONTRAST Result Date: 12/15/2023 CLINICAL DATA:  Newly diagnosed colon carcinoma. Staging. * Tracking Code: BO * EXAM: CT CHEST, ABDOMEN, AND PELVIS WITH CONTRAST TECHNIQUE: Multidetector CT imaging of the chest, abdomen and pelvis was performed following the standard protocol during bolus administration of intravenous contrast. RADIATION DOSE REDUCTION: This exam was performed according to the departmental dose-optimization program which includes automated exposure control, adjustment of the mA and/or kV according to patient size and/or use of iterative reconstruction technique. CONTRAST:  75mL ISOVUE-370 IOPAMIDOL (ISOVUE-370) INJECTION 76% COMPARISON:  None Available. FINDINGS: CT CHEST FINDINGS Cardiovascular: No acute findings. Mediastinum/Lymph Nodes: No masses or pathologically enlarged lymph nodes identified. Lungs/Pleura: No suspicious pulmonary nodules or masses identified. No evidence of infiltrate or pleural effusion. Musculoskeletal:  No suspicious bone lesions identified. CT ABDOMEN AND PELVIS FINDINGS Hepatobiliary: No masses identified. Gallbladder is unremarkable. No evidence of biliary ductal dilatation. Pancreas:  No mass or inflammatory changes. Spleen:  Within normal limits in size and appearance. Adrenals/Urinary tract: No suspicious  masses or hydronephrosis. Bilateral benign renal sinus cysts noted. Stomach/Bowel: Bulky annular constricting mass is seen in the hepatic flexure of the colon, measuring 6.7 by 4.6 cm, consistent with colon carcinoma. No evidence of bowel obstruction. Diverticulosis is seen mainly involving the sigmoid colon, however there is no evidence of diverticulitis. Vascular/Lymphatic: Pericolonic and right upper quadrant mesenteric lymphadenopathy is seen with lymph nodes measuring up to 10 mm, consistent with  metastatic disease. No acute vascular findings. Reproductive: Uterus is unremarkable. Simple left ovarian cyst is seen measuring 3.3 cm. No No evidence of inflammatory process or ascites. Other:  None. Musculoskeletal:  No suspicious bone lesions identified. IMPRESSION: Bulky annular constricting mass in the hepatic flexure of the colon, consistent with colon carcinoma. Pericolonic and right upper quadrant mesenteric lymphadenopathy, consistent with metastatic disease. No other sites of metastatic disease identified. 3.3 cm benign-appearing left ovarian cyst. Recommend follow-up US  in 6-12 months. Reference: JACR 2020 Feb; 17(2):248-254 Sigmoid diverticulosis, without radiographic evidence of diverticulitis. Electronically Signed   By: Marlyce Sine M.D.   On: 12/15/2023 12:00    Anti-infectives: Anti-infectives (From admission, onward)    None       Assessment/Plan: s/p Procedure(s): COLONOSCOPY (N/A) EGD (ESOPHAGOGASTRODUODENOSCOPY) (N/A) BIOPSY, SKIN, SUBCUTANEOUS TISSUE, OR MUCOUS MEMBRANE POLYPECTOMY, INTESTINE SCLEROTHERAPY Will need echo results and potential medical optimization.   Surgical timing will be up to Dr. Davonna Estes tomorrow.  Will tentatively plan tomorrow, but could get bumped depending on triage of other cases.     LOS: 4 days    Lockie Rima 12/16/2023

## 2023-12-16 NOTE — Progress Notes (Signed)
 Echocardiogram 2D Echocardiogram has been performed.  Ashana Tullo N Glennda Weatherholtz,RDCS 12/16/2023, 1:17 PM

## 2023-12-16 NOTE — Progress Notes (Addendum)
 PROGRESS NOTE    Leah Hernandez  WUJ:[id number] DOB: [birth year] DOA: 12/12/2023 PCP: Collins Dean, NP   Chief Complaint  Patient presents with   fainted yesterday    Brief Narrative:   Leah Hernandez is a 63 y.o. female with no medical history no regular follow-up presented, she went to PCP for presyncope, workup in ED significant for anemia, globin of 5.1, she went for endoscopy, workup significant for colon mass s/p biopsy, please see discussion below.   Assessment & Plan:   Principal Problem:   Symptomatic anemia Active Problems:   Iron deficiency anemia   Loss of weight   Polyp of colon  Chronic blood loss anemia/symptomatic Lower GI bleed Transverse colon mass concerning for malignancy Iron deficiency anemia B12 deficiency Weight loss Gastritis - Workup significant iron deficiency anemia, B12, patient never had colonoscopy before, with significant weight loss, so concern for malignancy. - GI input greatly appreciated, status post endoscopy and colonoscopy 5/16, workup significant for transverse colon mass concerning for malignancy s/p biopsy, endoscopy significant for gastritis. - Follow on biopsy results, - Concerning for malignancy, so CT chest/abdomen/pelvis has been obtained, significant for hepatic flexure mass with surrounding mesenteric lymphadenopathy, otherwise no evidence of metastasis . - General Surgery greatly appreciated, plan for partial colectomy, either tomorrow or day after. - Discussed with oncology,Dr. Pasam will arrange for outpatient follow-up versus routine nonurgent consult . -CEA is pending - Received 1 dose of IV iron, further p.o. iron on discharge, and referral been made for IV iron at the infusion center.. - Hemoglobin remained stable posttransfusion, continue to monitor closely - Endoscopy significant for gastritis follow-up on biopsy, continue with PPI  Renal insufficiency - No clear baseline, avoid nephrotoxic  medications  NSVT -patient had remittent few beats beats of NSVT , asymptomatic, target magnesium more than 2, potassium more than 4, - Obtain 2D echo, echo was done, pending - On low-dose Lovenox  Addendum Acute systolic CHF -2D echo came back with low EF 20 to 25% with global hypokinesis,  I have discussed with cardiology, they will consult for preop cardiac clearance .  Initial recommendation to change Coreg to metoprolol and will await further recommendations.  Left ovarian cyst - Benign appearing, will need Will need repeat ultrasound to 70-month per radiology recommendation  Asymptomatic bacteriuria - He denies any symptoms, no indication to treat  Obesity class II Prediabetes Body mass index is 36.91 kg/m. - She would benefit from low-dose metformin once stable   DVT prophylaxis: SCD, she was encouraged to ambulate, subcu Lovenox code Status: Full code Family Communication: None at bedside, I have offered to update family, but she reports for now she would like to update them herself, and told her I will be happy to do so whenever she wants me to update them. Disposition:   Status is: Inpatient    Consultants:  Gastroenterology General Surgery Discussed with oncology Dr. Randye Buttner by phone   Subjective: No nausea, no vomiting, she denies any complaints.  Objective: Vitals:   12/15/23 1929 12/16/23 0114 12/16/23 0348 12/16/23 1027  BP: 101/63 101/64 (!) 95/55 111/63  Pulse: 99 99 (!) 102 96  Resp: 16 16 17    Temp:  98 F (36.7 C) 98.1 F (36.7 C)   TempSrc:  Oral Oral   SpO2: 98% 99% 95% 100%  Weight:      Height:        Intake/Output Summary (Last 24 hours) at 12/16/2023 1334 Last data filed at 12/16/2023 0730  Gross per 24 hour  Intake 340 ml  Output --  Net 340 ml   Filed Weights   12/12/23 1642  Weight: 106.9 kg    Examination:   Awake Alert, Oriented X 3, No new F.N deficits, Normal affect Symmetrical Chest wall movement, Good air movement  bilaterally, CTAB RRR,No Gallops,Rubs or new Murmurs, No Parasternal Heave +ve B.Sounds, Abd Soft, No tenderness, No rebound - guarding or rigidity. No Cyanosis, Clubbing or edema, No new Rash or bruise       Data Reviewed: I have personally reviewed following labs and imaging studies  CBC: Recent Labs  Lab 12/12/23 1642 12/13/23 0607 12/13/23 1638 12/14/23 0449 12/14/23 1756 12/15/23 0445 12/16/23 0700  WBC 6.3 6.2  --   --   --  7.0 6.1  NEUTROABS 3.7  --   --   --   --   --   --   HGB 5.1* 6.9* 7.4* 7.0* 8.8* 8.9* 8.8*  HCT 18.7* 22.7* 25.3* 23.4* 28.6* 29.6* 28.6*  MCV 62.5* 67.8*  --   --   --  71.3* 70.8*  PLT 512* 406*  --   --   --  392 413*    Basic Metabolic Panel: Recent Labs  Lab 12/12/23 2052 12/13/23 0607 12/14/23 0449 12/14/23 1756 12/15/23 0445 12/16/23 0700  NA 133* 138 138  --  137 136  K 3.7 4.0 3.8  --  3.9 4.3  CL 105 111 108  --  107 106  CO2 20* 22 22  --  20* 22  GLUCOSE 102* 99 95  --  95 84  BUN 12 9 6*  --  6* 6*  CREATININE 1.08* 1.05* 1.06*  --  1.08* 1.10*  CALCIUM 8.5* 8.9 8.8*  --  9.1 8.9  MG  --   --   --  1.8  --   --     GFR: Estimated Creatinine Clearance: 65.9 mL/min (A) (by C-G formula based on SCr of 1.1 mg/dL (H)).  Liver Function Tests: Recent Labs  Lab 12/12/23 2052  AST 21  ALT 11  ALKPHOS 82  BILITOT 0.5  PROT 6.8  ALBUMIN 2.8*    CBG: Recent Labs  Lab 12/14/23 0727  GLUCAP 85     No results found for this or any previous visit (from the past 240 hours).       Radiology Studies: CT CHEST ABDOMEN PELVIS W CONTRAST Result Date: 12/15/2023 CLINICAL DATA:  Newly diagnosed colon carcinoma. Staging. * Tracking Code: BO * EXAM: CT CHEST, ABDOMEN, AND PELVIS WITH CONTRAST TECHNIQUE: Multidetector CT imaging of the chest, abdomen and pelvis was performed following the standard protocol during bolus administration of intravenous contrast. RADIATION DOSE REDUCTION: This exam was performed according to the  departmental dose-optimization program which includes automated exposure control, adjustment of the mA and/or kV according to patient size and/or use of iterative reconstruction technique. CONTRAST:  75mL ISOVUE-370 IOPAMIDOL (ISOVUE-370) INJECTION 76% COMPARISON:  None Available. FINDINGS: CT CHEST FINDINGS Cardiovascular: No acute findings. Mediastinum/Lymph Nodes: No masses or pathologically enlarged lymph nodes identified. Lungs/Pleura: No suspicious pulmonary nodules or masses identified. No evidence of infiltrate or pleural effusion. Musculoskeletal:  No suspicious bone lesions identified. CT ABDOMEN AND PELVIS FINDINGS Hepatobiliary: No masses identified. Gallbladder is unremarkable. No evidence of biliary ductal dilatation. Pancreas:  No mass or inflammatory changes. Spleen:  Within normal limits in size and appearance. Adrenals/Urinary tract: No suspicious masses or hydronephrosis. Bilateral benign renal sinus cysts noted. Stomach/Bowel: Bulky  annular constricting mass is seen in the hepatic flexure of the colon, measuring 6.7 by 4.6 cm, consistent with colon carcinoma. No evidence of bowel obstruction. Diverticulosis is seen mainly involving the sigmoid colon, however there is no evidence of diverticulitis. Vascular/Lymphatic: Pericolonic and right upper quadrant mesenteric lymphadenopathy is seen with lymph nodes measuring up to 10 mm, consistent with metastatic disease. No acute vascular findings. Reproductive: Uterus is unremarkable. Simple left ovarian cyst is seen measuring 3.3 cm. No No evidence of inflammatory process or ascites. Other:  None. Musculoskeletal:  No suspicious bone lesions identified. IMPRESSION: Bulky annular constricting mass in the hepatic flexure of the colon, consistent with colon carcinoma. Pericolonic and right upper quadrant mesenteric lymphadenopathy, consistent with metastatic disease. No other sites of metastatic disease identified. 3.3 cm benign-appearing left ovarian  cyst. Recommend follow-up US  in 6-12 months. Reference: JACR 2020 Feb; 17(2):248-254 Sigmoid diverticulosis, without radiographic evidence of diverticulitis. Electronically Signed   By: Marlyce Sine M.D.   On: 12/15/2023 12:00        Scheduled Meds:  sodium chloride    Intravenous Once   carvedilol  3.125 mg Oral BID WC   cyanocobalamin   1,000 mcg Subcutaneous Daily   enoxaparin (LOVENOX) injection  40 mg Subcutaneous Q24H   feeding supplement  237 mL Oral BID BM   pantoprazole   40 mg Oral BID   Continuous Infusions:     LOS: 4 days     Seena Dadds, MD [facility]   To contact the attending provider between 7A-7P or the covering provider during after hours 7P-7A, please log into the web site [email or web address] and access using universal [place] password for that web site. If you do not have the password, please call the hospital operator.  12/16/2023, 1:34 PM

## 2023-12-16 NOTE — Plan of Care (Signed)
 Pt has rested quietly throughout the night with no distress noted. Alert and oriented. On room air. SR on the  monitor. Up to BR standby assist. No complaints voiced.     Problem: Education: Goal: Knowledge of General Education information will improve Description: Including pain rating scale, medication(s)/side effects and non-pharmacologic comfort measures Outcome: Progressing   Problem: Health Behavior/Discharge Planning: Goal: Ability to manage health-related needs will improve Outcome: Progressing   Problem: Clinical Measurements: Goal: Respiratory complications will improve Outcome: Progressing Goal: Cardiovascular complication will be avoided Outcome: Progressing   Problem: Pain Managment: Goal: General experience of comfort will improve and/or be controlled Outcome: Progressing

## 2023-12-16 NOTE — H&P (View-Only) (Signed)
 Cardiology Consultation   Patient ID: JAISHA VILLACRES MRN: [id number]; DOB: [birth year]  Admit date: 12/12/2023 Date of Consult: 12/16/2023  PCP:  Collins Dean, NP   [facility] Providers Cardiologist: New   Patient Profile:   Leah Hernandez is a 63 y.o. female with a history of prediabetes but no known significant medical history prior to this admission who is being seen 12/16/2023 for the evaluation of new cardiomyopathy at the request of Dr. Osborne Blazer.  History of Present Illness:   Leah Hernandez is a 63 year old female with a history of prediabetes but no other known medical history prior to this admission although she has not seen primary care provider in a few years.  She presented to the [facility] ED on 12/12/2023 for further evaluation of near syncope.  She initially went to an Urgent Care the day before following this near syncopal episode.  Labs were drawn and showed her hemoglobin was low which is when she was advised to come to the ED.  Upon arrival to the ED, EKG showed sinus tachycardia rate 123 bpm, with no acute ischemic changes. WBC 6.3, Hgb 5.1, Plts 512. Na 132, K 3.8, Glucose 105, BUN 14, Cr 1.08.  She received a total of 3 units of PRBCs.  GI was consulted and she underwent EGD/colonoscopy which showed a mass in the proximal transverse colon as well as 2 other polyps.  Lesion was concerning for malignancy based on appearance.  She then underwent CT of her chest/abdomen/pelvis which showed a bulky annular constricting mass in the hepatic flexure of the colon consistent with colon carcinoma as well as.  Colonic and right upper quadrant mesenteric lymphadenopathy consistent with metastatic disease.  General surgery was consulted plans for hemicolectomy.  Echo was done today and showed LVEF of 20-25% with global hypokinesis, severe asymmetric LVH of the lateral segment, and grade 1 diastolic dysfunction.  RV was normal and there was no significant valvular  disease.  Cardiology was consulted for further evaluation of new cardiomyopathy.  Patient reports she is have been having progressive fatigue for the last 5 to 6 months.  She states she went to the grocery store on 12/11/2023 getting very tired walking around the store.  By the time she made it to her car, she had severe fatigue/weakness and felt like she was going to pass out.  She denies any overt syncope.  This is when a family member decided to take her to the Urgent Care.  She denies any chest pain.  She does report some minimal dyspnea on exertion but nothing significant.  No shortness of breath at rest.  She does state it is uncomfortable for her to lay flat on her back at night but she is able to lay on a flat surface on her side.  No PND or significant edema.  No palpitations or significant lightheadedness/dizziness.  She does describe some abdominal bloatin bloating, nausea, decreased appetite for the past several months.  She also reports some bright red blood in her stools when she is constipated but no melena or hematuria.  She has an occasional cough but no recent fevers or URI symptoms.  She has a remote smoking history.  He states she smoked briefly in her 6s but nothing since then.  No alcohol or drug use.  She does have a family history of heart disease.  She states her mother died from a MI in her 30s.  Her sister also had an MI  in her late 30s.   Past Medical History:  Diagnosis Date   Prediabetes     Past Surgical History:  Procedure Laterality Date   TUBAL LIGATION       Home Medications:  Prior to Admission medications   Medication Sig Start Date End Date Taking? Authorizing Provider  acetaminophen  (TYLENOL ) 325 MG tablet Take 325-650 mg by mouth every 6 (six) hours as needed for mild pain (pain score 1-3) or moderate pain (pain score 4-6).   Yes [provider]    Inpatient Medications: Scheduled Meds:  sodium chloride    Intravenous Once   [START ON  12/17/2023] acetaminophen   1,000 mg Oral On Call to OR   [START ON 12/17/2023] alvimopan  12 mg Oral On Call to OR   [START ON 12/17/2023] bupivacaine  liposome  266 mg Infiltration Once   cyanocobalamin   1,000 mcg Subcutaneous Daily   enoxaparin (LOVENOX) injection  40 mg Subcutaneous Once   feeding supplement  237 mL Oral BID BM   [START ON 12/17/2023] feeding supplement  296 mL Oral Once   feeding supplement  592 mL Oral Once   [START ON 12/17/2023] gabapentin   200 mg Oral On Call to OR   metoprolol tartrate  25 mg Oral BID   neomycin  1,000 mg Oral 3 times per day   And   metroNIDAZOLE  1,000 mg Oral 3 times per day   pantoprazole   40 mg Oral BID   Continuous Infusions:  [START ON 12/17/2023] cefoTEtan (CEFOTAN) IV     PRN Meds: acetaminophen , melatonin, ondansetron  **OR** ondansetron  (ZOFRAN ) IV  Allergies:   No Known Allergies  Social History:   Social History   Socioeconomic History   Marital status: Married    Spouse name: Not on file   Number of children: Not on file   Years of education: Not on file   Highest education level: Not on file  Occupational History   Not on file  Tobacco Use   Smoking status: Never   Smokeless tobacco: Never  Vaping Use   Vaping status: Never Used  Substance and Sexual Activity   Alcohol use: No   Drug use: No   Sexual activity: Yes    Birth control/protection: Surgical  Other Topics Concern   Not on file  Social History Narrative   Not on file   Social Drivers of Health   Financial Resource Strain: Not on file  Food Insecurity: No Food Insecurity (12/13/2023)   Hunger Vital Sign    Worried About Running Out of Food in the Last Year: Never true    Ran Out of Food in the Last Year: Never true  Transportation Needs: No Transportation Needs (12/13/2023)   PRAPARE - Administrator, Civil Service (Medical): No    Lack of Transportation (Non-Medical): No  Physical Activity: Not on file  Stress: Not on file  Social  Connections: Not on file  Intimate Partner Violence: Not At Risk (12/13/2023)   Humiliation, Afraid, Rape, and Kick questionnaire    Fear of Current or Ex-Partner: No    Emotionally Abused: No    Physically Abused: No    Sexually Abused: No    Family History:   Family History  Problem Relation Age of Onset   Hypertension Neg Hx      ROS:  Please see the history of present illness.  Review of Systems  Constitutional:  Positive for malaise/fatigue.  HENT:  Negative for congestion.   Respiratory:  Positive  for cough and shortness of breath. Negative for sputum production.   Cardiovascular:  Negative for orthopnea, leg swelling and PND.  Gastrointestinal:  Positive for blood in stool and nausea.  Genitourinary:  Negative for hematuria.  Musculoskeletal:  Negative for myalgias.  Neurological:  Positive for weakness. Negative for loss of consciousness.  Endo/Heme/Allergies:  Bruises/bleeds easily.  Psychiatric/Behavioral:  Negative for substance abuse.       Physical Exam/Data:   Vitals:   12/15/23 1929 12/16/23 0114 12/16/23 0348 12/16/23 1027  BP: 101/63 101/64 (!) 95/55 111/63  Pulse: 99 99 (!) 102 96  Resp: 16 16 17    Temp:  98 F (36.7 C) 98.1 F (36.7 C)   TempSrc:  Oral Oral   SpO2: 98% 99% 95% 100%  Weight:      Height:        Intake/Output Summary (Last 24 hours) at 12/16/2023 1426 Last data filed at 12/16/2023 0730 Gross per 24 hour  Intake 340 ml  Output --  Net 340 ml      12/12/2023    4:42 PM 04/30/2017    8:35 AM 04/23/2017    9:23 AM  Last 3 Weights  Weight (lbs) 235 lb 10.8 oz 235 lb 9.6 oz 236 lb 12.8 oz  Weight (kg) 106.9 kg 106.867 kg 107.412 kg     Body mass index is 36.91 kg/m.  General: 63 y.o. African-American female resting comfortably in no acute distress. HEENT: Normocephalic and atraumatic. Sclera clear.  Neck: Supple.  No JVD. Heart: Tachycardic with regular rhythm. Distinct S1 and S2. No murmurs, gallops, or rubs.  Lungs: No  increased work of breathing. Clear to ausculation bilaterally. No wheezes, rhonchi, or rales.  Abdomen: Soft, non-distended, and non-tender to palpation.  Extremities: No lower extremity edema.    Skin: Warm and dry. Neuro: Alert and oriented x3. No focal deficits. Psych: Normal affect. Responds appropriately.   EKG:  The EKG was personally reviewed and demonstrates:  EKG showed sinus tachycardia rate 123 bpm, with no acute ischemic changes.  Telemetry:  Telemetry was personally reviewed and demonstrates:  Normal sinus rhythm/ sinus tachycardia with rate in the 90s to 110s. PVCs and one 17 beat run of NSVT noted.   Relevant CV Studies:  Echocardiogram 12/15/2023: Impressions: 1. Left ventricular ejection fraction, by estimation, is 20 to 25%. Left  ventricular ejection fraction by 3D volume is 38 %. The left ventricle has  severely decreased function. The left ventricle demonstrates global  hypokinesis. The left ventricular  internal cavity size was moderately to severely dilated. There is severe  asymmetric left ventricular hypertrophy of the lateral segment. Left  ventricular diastolic parameters are consistent with Grade I diastolic  dysfunction (impaired relaxation). The  average left ventricular global longitudinal strain is -9.4 %. The global  longitudinal strain is abnormal.   2. Right ventricular systolic function is normal. The right ventricular  size is normal. There is normal pulmonary artery systolic pressure.   3. The mitral valve is normal in structure. No evidence of mitral valve  regurgitation. No evidence of mitral stenosis.   4. The aortic valve is tricuspid. Aortic valve regurgitation is not  visualized. No aortic stenosis is present.   5. The inferior vena cava is normal in size with greater than 50%  respiratory variability, suggesting right atrial pressure of 3 mmHg.   Comparison(s): No prior Echocardiogram.   Laboratory Data:  High Sensitivity Troponin:  No  results for input(s): "TROPONINIHS" in the last 720 hours.  Chemistry Recent Labs  Lab 12/14/23 0449 12/14/23 1756 12/15/23 0445 12/16/23 0700  NA 138  --  137 136  K 3.8  --  3.9 4.3  CL 108  --  107 106  CO2 22  --  20* 22  GLUCOSE 95  --  95 84  BUN 6*  --  6* 6*  CREATININE 1.06*  --  1.08* 1.10*  CALCIUM 8.8*  --  9.1 8.9  MG  --  1.8  --   --   GFRNONAA 59*  --  58* 56*  ANIONGAP 8  --  10 8    Recent Labs  Lab 12/12/23 2052  PROT 6.8  ALBUMIN 2.8*  AST 21  ALT 11  ALKPHOS 82  BILITOT 0.5   Lipids No results for input(s): "CHOL", "TRIG", "HDL", "LABVLDL", "LDLCALC", "CHOLHDL" in the last 168 hours.  Hematology Recent Labs  Lab 12/13/23 0607 12/13/23 1638 12/14/23 1756 12/15/23 0445 12/16/23 0700  WBC 6.2  --   --  7.0 6.1  RBC 3.35*  --   --  4.15 4.04  HGB 6.9*   < > 8.8* 8.9* 8.8*  HCT 22.7*   < > 28.6* 29.6* 28.6*  MCV 67.8*  --   --  71.3* 70.8*  MCH 20.6*  --   --  21.4* 21.8*  MCHC 30.4  --   --  30.1 30.8  RDW 26.2*  --   --  28.2* 29.2*  PLT 406*  --   --  392 413*   < > = values in this interval not displayed.   Thyroid  Recent Labs  Lab 12/12/23 2042  TSH 3.812    BNPNo results for input(s): "BNP", "PROBNP" in the last 168 hours.  DDimer No results for input(s): "DDIMER" in the last 168 hours.     Assessment and Plan:   New Dilated Cardiomyopathy Patient presented for further evaluation of near syncope and was noted to have acute anemia with hemoglobin as low as 5.1.  Workup revealed a mass in her colon concerning for cancer.  Echo showed LVEF of 20-25% with global hypokinesis, severe asymmetric LVH of the lateral segment, and grade 1 diastolic dysfunction.  RV was normal and there was no significant valvular disease.  - She reports progressive fatigue over the last 5 to 6 months which suspect is due to her severe anemia but no real CHF symptoms.  Euvolemic on exam. - No need for diuresis at this time. - GDMT limited by soft BP. -  She was initially started on Coreg but has been switched to Lopressor 25mg  twice daily. Agree with this for now. Leah Hernandez need to transition to Toprol-XL prior to discharge. - Leah Hernandez hold on any other GDMT right now until after surgery. - Etiology unclear. She denies any chest pain but she has a family history of CAD at an early age with both her mother and sister having MIs in their 11s. Recommended R/ LHC prior to abdominal surgery for further risk stratification. Do not suspect that we would intervene even if she has obstructive CAD given need for surgery for possible malignancy.  The patient understands that risks include but are not limited to stroke (1 in 1000), death (1 in 1000), kidney failure [usually temporary] (1 in 500), bleeding (1 in 200), allergic reaction [possibly serious] (1 in 200), and agrees to proceed.   Near Syncope Suspect this was due to severe anemia. Although, cannot rule out ventricular arrhythmias with severely reduced  EF. - Continue to monitor on telemetry. Consider outpatient monitor at discharge.  NSVT Telemetry shows one 17 beat run of NSVT today.  - She was initially started on Coreg but was switched to Lopressor 25mg  twice daily today. Agree with this but would switch to Toprol-XL prior to discharge given reduced EF.  Acute Anemia Lower GI Bleed Transverse Colon Mass Hemoglobin 5.1 on admission. S/p 3 unit of PRBCs. Work-up showed mass in transverse colon concerning for malignancy. - Hemoglobin stable at 8.8 today. - Management per Internal Medicine and General Surgery. Plan is for hemicolectomy early this week. Hemoglobin   Risk Assessment/Risk Scores:    [place]  Heart Association (NYHA) Functional Class NYHA Class II-III (suspect functional limitation recently is more due to severe anemia)  For questions or updates, please contact  [facility] Please consult [email or web address] for contact info under    Signed, Leah E Goodrich, PA-C  12/16/2023  3:32 PM  I have seen and examined this patient with Leah Hernandez.  Agree with above, note added to reflect my findings.  She presented to the hospital 12/12/2023 with an episode of near syncope.  She was found to be anemic with a hemoglobin of 5.1.  GI was consulted with colonoscopy showing a transverse colon mass.  CT scan confirmed the mass at the hepatic flexure.  General surgery was consulted and has plan for hemicolectomy.  She had an echo that showed an ejection fraction of 2025% with global hypokinesis.  She reports progressive fatigue.  She was very tired walking around the store.  She denies any overt syncope.  Now that she has received 3 units of red cells, she is feeling much improved with quite a bit less fatigue and no shortness of breath.  She feels back to her normal.  GEN: No acute distress.   Neck: No JVD Cardiac: RRR, no murmurs, rubs, or gallops.  Respiratory: normal BS bases bilaterally. GI: Soft, nontender, non-distended  MS: No edema; No deformity. Neuro:  Nonfocal  Skin: warm and dry Psych: Normal affect    New cardiomyopathy: Patient presented for syncope and was found to be anemic, has plans for hemicolectomy for likely colon cancer.  She has now a new cardiomyopathy.  She has not had chest pain, but due to her cardiomyopathy and upcoming surgery, Leah Hernandez likely need left and right heart catheterization to further determine the cause of her myopathy.  Leah Hernandez plan for tomorrow.  Further preoperative evaluation post catheterization. Near syncope: Likely due to anemia, though cannot rule out ventricular arrhythmias.  Continue on telemetry until discharge. Transverse colon mass: Has plans for hemicolectomy. Nonsustained VT: Continue beta-blocker  Leah Hernandez M. Hendrix Console MD 12/16/2023 3:51 PM

## 2023-12-16 NOTE — Consult Note (Addendum)
 Cardiology Consultation   Patient ID: Leah Hernandez MRN: [id number]; DOB: [birth year]  Admit date: 12/12/2023 Date of Consult: 12/16/2023  PCP:  Collins Dean, NP   [facility] Providers Cardiologist: New   Patient Profile:   Leah Hernandez is a 63 y.o. female with a history of prediabetes but no known significant medical history prior to this admission who is being seen 12/16/2023 for the evaluation of new cardiomyopathy at the request of Dr. Osborne Blazer.  History of Present Illness:   Leah Hernandez is a 63 year old female with a history of prediabetes but no other known medical history prior to this admission although she has not seen primary care provider in a few years.  She presented to the [facility] ED on 12/12/2023 for further evaluation of near syncope.  She initially went to an Urgent Care the day before following this near syncopal episode.  Labs were drawn and showed her hemoglobin was low which is when she was advised to come to the ED.  Upon arrival to the ED, EKG showed sinus tachycardia rate 123 bpm, with no acute ischemic changes. WBC 6.3, Hgb 5.1, Plts 512. Na 132, K 3.8, Glucose 105, BUN 14, Cr 1.08.  She received a total of 3 units of PRBCs.  GI was consulted and she underwent EGD/colonoscopy which showed a mass in the proximal transverse colon as well as 2 other polyps.  Lesion was concerning for malignancy based on appearance.  She then underwent CT of her chest/abdomen/pelvis which showed a bulky annular constricting mass in the hepatic flexure of the colon consistent with colon carcinoma as well as.  Colonic and right upper quadrant mesenteric lymphadenopathy consistent with metastatic disease.  General surgery was consulted plans for hemicolectomy.  Echo was done today and showed LVEF of 20-25% with global hypokinesis, severe asymmetric LVH of the lateral segment, and grade 1 diastolic dysfunction.  RV was normal and there was no significant valvular  disease.  Cardiology was consulted for further evaluation of new cardiomyopathy.  Patient reports she is have been having progressive fatigue for the last 5 to 6 months.  She states she went to the grocery store on 12/11/2023 getting very tired walking around the store.  By the time she made it to her car, she had severe fatigue/weakness and felt like she was going to pass out.  She denies any overt syncope.  This is when a family member decided to take her to the Urgent Care.  She denies any chest pain.  She does report some minimal dyspnea on exertion but nothing significant.  No shortness of breath at rest.  She does state it is uncomfortable for her to lay flat on her back at night but she is able to lay on a flat surface on her side.  No PND or significant edema.  No palpitations or significant lightheadedness/dizziness.  She does describe some abdominal bloatin bloating, nausea, decreased appetite for the past several months.  She also reports some bright red blood in her stools when she is constipated but no melena or hematuria.  She has an occasional cough but no recent fevers or URI symptoms.  She has a remote smoking history.  He states she smoked briefly in her 6s but nothing since then.  No alcohol or drug use.  She does have a family history of heart disease.  She states her mother died from a MI in her 30s.  Her sister also had an MI  in her late 30s.   Past Medical History:  Diagnosis Date   Prediabetes     Past Surgical History:  Procedure Laterality Date   TUBAL LIGATION       Home Medications:  Prior to Admission medications   Medication Sig Start Date End Date Taking? Authorizing Provider  acetaminophen  (TYLENOL ) 325 MG tablet Take 325-650 mg by mouth every 6 (six) hours as needed for mild pain (pain score 1-3) or moderate pain (pain score 4-6).   Yes [provider]    Inpatient Medications: Scheduled Meds:  sodium chloride    Intravenous Once   [START ON  12/17/2023] acetaminophen   1,000 mg Oral On Call to OR   [START ON 12/17/2023] alvimopan  12 mg Oral On Call to OR   [START ON 12/17/2023] bupivacaine  liposome  266 mg Infiltration Once   cyanocobalamin   1,000 mcg Subcutaneous Daily   enoxaparin (LOVENOX) injection  40 mg Subcutaneous Once   feeding supplement  237 mL Oral BID BM   [START ON 12/17/2023] feeding supplement  296 mL Oral Once   feeding supplement  592 mL Oral Once   [START ON 12/17/2023] gabapentin   200 mg Oral On Call to OR   metoprolol tartrate  25 mg Oral BID   neomycin  1,000 mg Oral 3 times per day   And   metroNIDAZOLE  1,000 mg Oral 3 times per day   pantoprazole   40 mg Oral BID   Continuous Infusions:  [START ON 12/17/2023] cefoTEtan (CEFOTAN) IV     PRN Meds: acetaminophen , melatonin, ondansetron  **OR** ondansetron  (ZOFRAN ) IV  Allergies:   No Known Allergies  Social History:   Social History   Socioeconomic History   Marital status: Married    Spouse name: Not on file   Number of children: Not on file   Years of education: Not on file   Highest education level: Not on file  Occupational History   Not on file  Tobacco Use   Smoking status: Never   Smokeless tobacco: Never  Vaping Use   Vaping status: Never Used  Substance and Sexual Activity   Alcohol use: No   Drug use: No   Sexual activity: Yes    Birth control/protection: Surgical  Other Topics Concern   Not on file  Social History Narrative   Not on file   Social Drivers of Health   Financial Resource Strain: Not on file  Food Insecurity: No Food Insecurity (12/13/2023)   Hunger Vital Sign    Worried About Running Out of Food in the Last Year: Never true    Ran Out of Food in the Last Year: Never true  Transportation Needs: No Transportation Needs (12/13/2023)   PRAPARE - Administrator, Civil Service (Medical): No    Lack of Transportation (Non-Medical): No  Physical Activity: Not on file  Stress: Not on file  Social  Connections: Not on file  Intimate Partner Violence: Not At Risk (12/13/2023)   Humiliation, Afraid, Rape, and Kick questionnaire    Fear of Current or Ex-Partner: No    Emotionally Abused: No    Physically Abused: No    Sexually Abused: No    Family History:   Family History  Problem Relation Age of Onset   Hypertension Neg Hx      ROS:  Please see the history of present illness.  Review of Systems  Constitutional:  Positive for malaise/fatigue.  HENT:  Negative for congestion.   Respiratory:  Positive  for cough and shortness of breath. Negative for sputum production.   Cardiovascular:  Negative for orthopnea, leg swelling and PND.  Gastrointestinal:  Positive for blood in stool and nausea.  Genitourinary:  Negative for hematuria.  Musculoskeletal:  Negative for myalgias.  Neurological:  Positive for weakness. Negative for loss of consciousness.  Endo/Heme/Allergies:  Bruises/bleeds easily.  Psychiatric/Behavioral:  Negative for substance abuse.       Physical Exam/Data:   Vitals:   12/15/23 1929 12/16/23 0114 12/16/23 0348 12/16/23 1027  BP: 101/63 101/64 (!) 95/55 111/63  Pulse: 99 99 (!) 102 96  Resp: 16 16 17    Temp:  98 F (36.7 C) 98.1 F (36.7 C)   TempSrc:  Oral Oral   SpO2: 98% 99% 95% 100%  Weight:      Height:        Intake/Output Summary (Last 24 hours) at 12/16/2023 1426 Last data filed at 12/16/2023 0730 Gross per 24 hour  Intake 340 ml  Output --  Net 340 ml      12/12/2023    4:42 PM 04/30/2017    8:35 AM 04/23/2017    9:23 AM  Last 3 Weights  Weight (lbs) 235 lb 10.8 oz 235 lb 9.6 oz 236 lb 12.8 oz  Weight (kg) 106.9 kg 106.867 kg 107.412 kg     Body mass index is 36.91 kg/m.  General: 63 y.o. African-American female resting comfortably in no acute distress. HEENT: Normocephalic and atraumatic. Sclera clear.  Neck: Supple.  No JVD. Heart: Tachycardic with regular rhythm. Distinct S1 and S2. No murmurs, gallops, or rubs.  Lungs: No  increased work of breathing. Clear to ausculation bilaterally. No wheezes, rhonchi, or rales.  Abdomen: Soft, non-distended, and non-tender to palpation.  Extremities: No lower extremity edema.    Skin: Warm and dry. Neuro: Alert and oriented x3. No focal deficits. Psych: Normal affect. Responds appropriately.   EKG:  The EKG was personally reviewed and demonstrates:  EKG showed sinus tachycardia rate 123 bpm, with no acute ischemic changes.  Telemetry:  Telemetry was personally reviewed and demonstrates:  Normal sinus rhythm/ sinus tachycardia with rate in the 90s to 110s. PVCs and one 17 beat run of NSVT noted.   Relevant CV Studies:  Echocardiogram 12/15/2023: Impressions: 1. Left ventricular ejection fraction, by estimation, is 20 to 25%. Left  ventricular ejection fraction by 3D volume is 38 %. The left ventricle has  severely decreased function. The left ventricle demonstrates global  hypokinesis. The left ventricular  internal cavity size was moderately to severely dilated. There is severe  asymmetric left ventricular hypertrophy of the lateral segment. Left  ventricular diastolic parameters are consistent with Grade I diastolic  dysfunction (impaired relaxation). The  average left ventricular global longitudinal strain is -9.4 %. The global  longitudinal strain is abnormal.   2. Right ventricular systolic function is normal. The right ventricular  size is normal. There is normal pulmonary artery systolic pressure.   3. The mitral valve is normal in structure. No evidence of mitral valve  regurgitation. No evidence of mitral stenosis.   4. The aortic valve is tricuspid. Aortic valve regurgitation is not  visualized. No aortic stenosis is present.   5. The inferior vena cava is normal in size with greater than 50%  respiratory variability, suggesting right atrial pressure of 3 mmHg.   Comparison(s): No prior Echocardiogram.   Laboratory Data:  High Sensitivity Troponin:  No  results for input(s): "TROPONINIHS" in the last 720 hours.  Chemistry Recent Labs  Lab 12/14/23 0449 12/14/23 1756 12/15/23 0445 12/16/23 0700  NA 138  --  137 136  K 3.8  --  3.9 4.3  CL 108  --  107 106  CO2 22  --  20* 22  GLUCOSE 95  --  95 84  BUN 6*  --  6* 6*  CREATININE 1.06*  --  1.08* 1.10*  CALCIUM 8.8*  --  9.1 8.9  MG  --  1.8  --   --   GFRNONAA 59*  --  58* 56*  ANIONGAP 8  --  10 8    Recent Labs  Lab 12/12/23 2052  PROT 6.8  ALBUMIN 2.8*  AST 21  ALT 11  ALKPHOS 82  BILITOT 0.5   Lipids No results for input(s): "CHOL", "TRIG", "HDL", "LABVLDL", "LDLCALC", "CHOLHDL" in the last 168 hours.  Hematology Recent Labs  Lab 12/13/23 0607 12/13/23 1638 12/14/23 1756 12/15/23 0445 12/16/23 0700  WBC 6.2  --   --  7.0 6.1  RBC 3.35*  --   --  4.15 4.04  HGB 6.9*   < > 8.8* 8.9* 8.8*  HCT 22.7*   < > 28.6* 29.6* 28.6*  MCV 67.8*  --   --  71.3* 70.8*  MCH 20.6*  --   --  21.4* 21.8*  MCHC 30.4  --   --  30.1 30.8  RDW 26.2*  --   --  28.2* 29.2*  PLT 406*  --   --  392 413*   < > = values in this interval not displayed.   Thyroid  Recent Labs  Lab 12/12/23 2042  TSH 3.812    BNPNo results for input(s): "BNP", "PROBNP" in the last 168 hours.  DDimer No results for input(s): "DDIMER" in the last 168 hours.     Assessment and Plan:   New Dilated Cardiomyopathy Patient presented for further evaluation of near syncope and was noted to have acute anemia with hemoglobin as low as 5.1.  Workup revealed a mass in her colon concerning for cancer.  Echo showed LVEF of 20-25% with global hypokinesis, severe asymmetric LVH of the lateral segment, and grade 1 diastolic dysfunction.  RV was normal and there was no significant valvular disease.  - She reports progressive fatigue over the last 5 to 6 months which suspect is due to her severe anemia but no real CHF symptoms.  Euvolemic on exam. - No need for diuresis at this time. - GDMT limited by soft BP. -  She was initially started on Coreg but has been switched to Lopressor 25mg  twice daily. Agree with this for now. Tameca Jerez need to transition to Toprol-XL prior to discharge. - Arah Aro hold on any other GDMT right now until after surgery. - Etiology unclear. She denies any chest pain but she has a family history of CAD at an early age with both her mother and sister having MIs in their 11s. Recommended R/ LHC prior to abdominal surgery for further risk stratification. Do not suspect that we would intervene even if she has obstructive CAD given need for surgery for possible malignancy.  The patient understands that risks include but are not limited to stroke (1 in 1000), death (1 in 1000), kidney failure [usually temporary] (1 in 500), bleeding (1 in 200), allergic reaction [possibly serious] (1 in 200), and agrees to proceed.   Near Syncope Suspect this was due to severe anemia. Although, cannot rule out ventricular arrhythmias with severely reduced  EF. - Continue to monitor on telemetry. Consider outpatient monitor at discharge.  NSVT Telemetry shows one 17 beat run of NSVT today.  - She was initially started on Coreg but was switched to Lopressor 25mg  twice daily today. Agree with this but would switch to Toprol-XL prior to discharge given reduced EF.  Acute Anemia Lower GI Bleed Transverse Colon Mass Hemoglobin 5.1 on admission. S/p 3 unit of PRBCs. Work-up showed mass in transverse colon concerning for malignancy. - Hemoglobin stable at 8.8 today. - Management per Internal Medicine and General Surgery. Plan is for hemicolectomy early this week. Hemoglobin   Risk Assessment/Risk Scores:    [place]  Heart Association (NYHA) Functional Class NYHA Class II-III (suspect functional limitation recently is more due to severe anemia)  For questions or updates, please contact  [facility] Please consult [email or web address] for contact info under    Signed, Callie E Goodrich, PA-C  12/16/2023  3:32 PM  I have seen and examined this patient with Callie Goodrich.  Agree with above, note added to reflect my findings.  She presented to the hospital 12/12/2023 with an episode of near syncope.  She was found to be anemic with a hemoglobin of 5.1.  GI was consulted with colonoscopy showing a transverse colon mass.  CT scan confirmed the mass at the hepatic flexure.  General surgery was consulted and has plan for hemicolectomy.  She had an echo that showed an ejection fraction of 2025% with global hypokinesis.  She reports progressive fatigue.  She was very tired walking around the store.  She denies any overt syncope.  Now that she has received 3 units of red cells, she is feeling much improved with quite a bit less fatigue and no shortness of breath.  She feels back to her normal.  GEN: No acute distress.   Neck: No JVD Cardiac: RRR, no murmurs, rubs, or gallops.  Respiratory: normal BS bases bilaterally. GI: Soft, nontender, non-distended  MS: No edema; No deformity. Neuro:  Nonfocal  Skin: warm and dry Psych: Normal affect    New cardiomyopathy: Patient presented for syncope and was found to be anemic, has plans for hemicolectomy for likely colon cancer.  She has now a new cardiomyopathy.  She has not had chest pain, but due to her cardiomyopathy and upcoming surgery, Develle Sievers likely need left and right heart catheterization to further determine the cause of her myopathy.  Belkis Norbeck plan for tomorrow.  Further preoperative evaluation post catheterization. Near syncope: Likely due to anemia, though cannot rule out ventricular arrhythmias.  Continue on telemetry until discharge. Transverse colon mass: Has plans for hemicolectomy. Nonsustained VT: Continue beta-blocker  Elda Dunkerson M. Hendrix Console MD 12/16/2023 3:51 PM

## 2023-12-17 ENCOUNTER — Telehealth (HOSPITAL_COMMUNITY): Payer: Self-pay | Admitting: Internal Medicine

## 2023-12-17 ENCOUNTER — Encounter (HOSPITAL_COMMUNITY)
Admission: EM | Disposition: A | Discharge status: Home / Self Care | Payer: Self-pay | Source: Home / Self Care | Attending: Internal Medicine

## 2023-12-17 ENCOUNTER — Encounter (HOSPITAL_COMMUNITY): Payer: Self-pay | Admitting: Pediatrics

## 2023-12-17 ENCOUNTER — Other Ambulatory Visit (HOSPITAL_COMMUNITY): Payer: Self-pay | Admitting: Internal Medicine

## 2023-12-17 DIAGNOSIS — I5021 Acute systolic (congestive) heart failure: Secondary | ICD-10-CM | POA: Diagnosis not present

## 2023-12-17 DIAGNOSIS — D508 Other iron deficiency anemias: Secondary | ICD-10-CM | POA: Diagnosis not present

## 2023-12-17 DIAGNOSIS — C183 Malignant neoplasm of hepatic flexure: Secondary | ICD-10-CM

## 2023-12-17 DIAGNOSIS — D509 Iron deficiency anemia, unspecified: Secondary | ICD-10-CM

## 2023-12-17 DIAGNOSIS — I428 Other cardiomyopathies: Secondary | ICD-10-CM | POA: Diagnosis not present

## 2023-12-17 DIAGNOSIS — D649 Anemia, unspecified: Secondary | ICD-10-CM | POA: Diagnosis not present

## 2023-12-17 DIAGNOSIS — K6389 Other specified diseases of intestine: Secondary | ICD-10-CM | POA: Diagnosis not present

## 2023-12-17 HISTORY — PX: RIGHT/LEFT HEART CATH AND CORONARY ANGIOGRAPHY: CATH118266

## 2023-12-17 LAB — POCT I-STAT EG7
Acid-base deficit: 3 mmol/L — ABNORMAL HIGH (ref 0.0–2.0)
Acid-base deficit: 3 mmol/L — ABNORMAL HIGH (ref 0.0–2.0)
Bicarbonate: 21.8 mmol/L (ref 20.0–28.0)
Bicarbonate: 21.9 mmol/L (ref 20.0–28.0)
Calcium, Ion: 1.19 mmol/L (ref 1.15–1.40)
Calcium, Ion: 1.21 mmol/L (ref 1.15–1.40)
HCT: 27 % — ABNORMAL LOW (ref 36.0–46.0)
HCT: 28 % — ABNORMAL LOW (ref 36.0–46.0)
Hemoglobin: 9.2 g/dL — ABNORMAL LOW (ref 12.0–15.0)
Hemoglobin: 9.5 g/dL — ABNORMAL LOW (ref 12.0–15.0)
O2 Saturation: 65 %
O2 Saturation: 66 %
Potassium: 3.7 mmol/L (ref 3.5–5.1)
Potassium: 3.7 mmol/L (ref 3.5–5.1)
Sodium: 138 mmol/L (ref 135–145)
Sodium: 139 mmol/L (ref 135–145)
TCO2: 23 mmol/L (ref 22–32)
TCO2: 23 mmol/L (ref 22–32)
pCO2, Ven: 38.1 mmHg — ABNORMAL LOW (ref 44–60)
pCO2, Ven: 38.6 mmHg — ABNORMAL LOW (ref 44–60)
pH, Ven: 7.36 (ref 7.25–7.43)
pH, Ven: 7.368 (ref 7.25–7.43)
pO2, Ven: 35 mmHg (ref 32–45)
pO2, Ven: 35 mmHg (ref 32–45)

## 2023-12-17 LAB — POCT I-STAT 7, (LYTES, BLD GAS, ICA,H+H)
Acid-base deficit: 3 mmol/L — ABNORMAL HIGH (ref 0.0–2.0)
Bicarbonate: 21.2 mmol/L (ref 20.0–28.0)
Calcium, Ion: 1.25 mmol/L (ref 1.15–1.40)
HCT: 28 % — ABNORMAL LOW (ref 36.0–46.0)
Hemoglobin: 9.5 g/dL — ABNORMAL LOW (ref 12.0–15.0)
O2 Saturation: 96 %
Potassium: 3.9 mmol/L (ref 3.5–5.1)
Sodium: 138 mmol/L (ref 135–145)
TCO2: 22 mmol/L (ref 22–32)
pCO2 arterial: 34.2 mmHg (ref 32–48)
pH, Arterial: 7.4 (ref 7.35–7.45)
pO2, Arterial: 83 mmHg (ref 83–108)

## 2023-12-17 LAB — CBC WITH DIFFERENTIAL/PLATELET
Abs Immature Granulocytes: 0 10*3/uL (ref 0.00–0.07)
Basophils Absolute: 0 10*3/uL (ref 0.0–0.1)
Basophils Relative: 0 %
Eosinophils Absolute: 0.4 10*3/uL (ref 0.0–0.5)
Eosinophils Relative: 6 %
HCT: 32.6 % — ABNORMAL LOW (ref 36.0–46.0)
Hemoglobin: 9.6 g/dL — ABNORMAL LOW (ref 12.0–15.0)
Lymphocytes Relative: 21 %
Lymphs Abs: 1.3 10*3/uL (ref 0.7–4.0)
MCH: 21.3 pg — ABNORMAL LOW (ref 26.0–34.0)
MCHC: 29.4 g/dL — ABNORMAL LOW (ref 30.0–36.0)
MCV: 72.4 fL — ABNORMAL LOW (ref 80.0–100.0)
Monocytes Absolute: 0.5 10*3/uL (ref 0.1–1.0)
Monocytes Relative: 8 %
Neutro Abs: 4.1 10*3/uL (ref 1.7–7.7)
Neutrophils Relative %: 65 %
Platelets: 440 10*3/uL — ABNORMAL HIGH (ref 150–400)
RBC: 4.5 MIL/uL (ref 3.87–5.11)
RDW: 29.5 % — ABNORMAL HIGH (ref 11.5–15.5)
WBC: 6.3 10*3/uL (ref 4.0–10.5)
nRBC: 0 % (ref 0.0–0.2)
nRBC: 0 /100{WBCs}

## 2023-12-17 LAB — MAGNESIUM: Magnesium: 2 mg/dL (ref 1.7–2.4)

## 2023-12-17 LAB — BASIC METABOLIC PANEL WITH GFR
Anion gap: 8 (ref 5–15)
BUN: 10 mg/dL (ref 8–23)
CO2: 22 mmol/L (ref 22–32)
Calcium: 8.9 mg/dL (ref 8.9–10.3)
Chloride: 105 mmol/L (ref 98–111)
Creatinine, Ser: 1.28 mg/dL — ABNORMAL HIGH (ref 0.44–1.00)
GFR, Estimated: 47 mL/min — ABNORMAL LOW (ref >60)
Glucose, Bld: 106 mg/dL — ABNORMAL HIGH (ref 70–99)
Potassium: 4.1 mmol/L (ref 3.5–5.1)
Sodium: 135 mmol/L (ref 135–145)

## 2023-12-17 LAB — SURGICAL PATHOLOGY

## 2023-12-17 LAB — CBC
HCT: 30 % — ABNORMAL LOW (ref 36.0–46.0)
Hemoglobin: 9 g/dL — ABNORMAL LOW (ref 12.0–15.0)
MCH: 21.7 pg — ABNORMAL LOW (ref 26.0–34.0)
MCHC: 30 g/dL (ref 30.0–36.0)
MCV: 72.3 fL — ABNORMAL LOW (ref 80.0–100.0)
Platelets: 410 10*3/uL — ABNORMAL HIGH (ref 150–400)
RBC: 4.15 MIL/uL (ref 3.87–5.11)
RDW: 29.5 % — ABNORMAL HIGH (ref 11.5–15.5)
WBC: 7.6 10*3/uL (ref 4.0–10.5)
nRBC: 0 % (ref 0.0–0.2)

## 2023-12-17 LAB — PROTIME-INR
INR: 1.1 (ref 0.8–1.2)
Prothrombin Time: 14 s (ref 11.4–15.2)

## 2023-12-17 LAB — HEMOGLOBIN A1C
Hgb A1c MFr Bld: 5.2 % (ref 4.8–5.6)
Mean Plasma Glucose: 102.54 mg/dL

## 2023-12-17 LAB — CEA: CEA: 27.6 ng/mL — ABNORMAL HIGH (ref 0.0–4.7)

## 2023-12-17 LAB — GLUCOSE, CAPILLARY: Glucose-Capillary: 84 mg/dL (ref 70–99)

## 2023-12-17 MED ORDER — NEOMYCIN SULFATE 500 MG PO TABS
1000.0000 mg | ORAL_TABLET | ORAL | Status: DC
  Administered 2023-12-17: 1000 mg via ORAL
  Filled 2023-12-17 (×3): qty 2

## 2023-12-17 MED ORDER — CHLORHEXIDINE GLUCONATE CLOTH 2 % EX PADS
6.0000 | MEDICATED_PAD | Freq: Once | CUTANEOUS | Status: AC
  Administered 2023-12-17: 6 via TOPICAL

## 2023-12-17 MED ORDER — ENSURE PRE-SURGERY PO LIQD
592.0000 mL | Freq: Once | ORAL | Status: AC
  Administered 2023-12-17: 592 mL via ORAL
  Filled 2023-12-17 (×2): qty 592

## 2023-12-17 MED ORDER — SODIUM CHLORIDE 0.9 % IV SOLN: 250.0000 mL | INTRAVENOUS | Status: AC | PRN

## 2023-12-17 MED ORDER — IOHEXOL 350 MG/ML SOLN
INTRAVENOUS | Status: DC | PRN
  Administered 2023-12-17: 40 mL

## 2023-12-17 MED ORDER — ASPIRIN 81 MG PO CHEW
CHEWABLE_TABLET | ORAL | Status: AC
  Filled 2023-12-17: qty 1

## 2023-12-17 MED ORDER — MIDAZOLAM HCL 2 MG/2ML IJ SOLN
INTRAMUSCULAR | Status: AC
  Filled 2023-12-17: qty 2

## 2023-12-17 MED ORDER — SODIUM CHLORIDE 0.9 % IV SOLN: 2.0000 g | INTRAVENOUS | Status: DC

## 2023-12-17 MED ORDER — SODIUM CHLORIDE 0.9 % IV SOLN
INTRAVENOUS | Status: DC
Start: 2023-12-17 — End: 2023-12-17

## 2023-12-17 MED ORDER — LIDOCAINE HCL (PF) 1 % IJ SOLN
INTRAMUSCULAR | Status: DC | PRN
  Administered 2023-12-17 (×2): 2 mL via INTRADERMAL

## 2023-12-17 MED ORDER — NA SULFATE-K SULFATE-MG SULF 17.5-3.13-1.6 GM/177ML PO SOLN
0.5000 | Freq: Once | ORAL | Status: AC
  Administered 2023-12-17: 177 mL via ORAL

## 2023-12-17 MED ORDER — LIDOCAINE HCL (PF) 1 % IJ SOLN
INTRAMUSCULAR | Status: AC
Start: 2023-12-17 — End: ?
  Filled 2023-12-17: qty 30

## 2023-12-17 MED ORDER — HEPARIN SODIUM (PORCINE) 1000 UNIT/ML IJ SOLN
INTRAMUSCULAR | Status: AC
  Filled 2023-12-17: qty 10

## 2023-12-17 MED ORDER — HEPARIN SODIUM (PORCINE) 1000 UNIT/ML IJ SOLN
INTRAMUSCULAR | Status: DC | PRN
  Administered 2023-12-17: 5000 [IU] via INTRAVENOUS

## 2023-12-17 MED ORDER — CHLORHEXIDINE GLUCONATE CLOTH 2 % EX PADS
6.0000 | MEDICATED_PAD | Freq: Once | CUTANEOUS | Status: AC
  Administered 2023-12-18: 6 via TOPICAL

## 2023-12-17 MED ORDER — METRONIDAZOLE 500 MG PO TABS
1000.0000 mg | ORAL_TABLET | ORAL | Status: DC
  Administered 2023-12-17: 1000 mg via ORAL
  Filled 2023-12-17: qty 2

## 2023-12-17 MED ORDER — SODIUM CHLORIDE 0.9% FLUSH
3.0000 mL | Freq: Two times a day (BID) | INTRAVENOUS | Status: DC
  Administered 2023-12-17 – 2023-12-22 (×10): 3 mL via INTRAVENOUS

## 2023-12-17 MED ORDER — ENSURE PRE-SURGERY PO LIQD
296.0000 mL | Freq: Once | ORAL | Status: DC
  Filled 2023-12-17: qty 296

## 2023-12-17 MED ORDER — FENTANYL CITRATE (PF) 100 MCG/2ML IJ SOLN
INTRAMUSCULAR | Status: DC | PRN
  Administered 2023-12-17: 25 ug via INTRAVENOUS

## 2023-12-17 MED ORDER — VERAPAMIL HCL 2.5 MG/ML IV SOLN
INTRAVENOUS | Status: DC | PRN
  Administered 2023-12-17: 5 mL via INTRA_ARTERIAL
  Administered 2023-12-17: 10 mL via INTRA_ARTERIAL

## 2023-12-17 MED ORDER — MIDAZOLAM HCL 2 MG/2ML IJ SOLN
INTRAMUSCULAR | Status: DC | PRN
  Administered 2023-12-17: 1 mg via INTRAVENOUS

## 2023-12-17 MED ORDER — HEPARIN (PORCINE) IN NACL 1000-0.9 UT/500ML-% IV SOLN
INTRAVENOUS | Status: DC | PRN
  Administered 2023-12-17 (×2): 500 mL

## 2023-12-17 MED ORDER — ENOXAPARIN SODIUM 40 MG/0.4ML IJ SOSY: 40.0000 mg | PREFILLED_SYRINGE | Freq: Once | INTRAMUSCULAR | Status: DC

## 2023-12-17 MED ORDER — SODIUM CHLORIDE 0.9% FLUSH: 3.0000 mL | INTRAVENOUS | Status: DC | PRN

## 2023-12-17 MED ORDER — NA SULFATE-K SULFATE-MG SULF 17.5-3.13-1.6 GM/177ML PO SOLN
0.5000 | Freq: Once | ORAL | Status: AC
  Administered 2023-12-17: 177 mL via ORAL
  Filled 2023-12-17 (×2): qty 1

## 2023-12-17 MED ORDER — VERAPAMIL HCL 2.5 MG/ML IV SOLN
INTRAVENOUS | Status: AC
  Filled 2023-12-17: qty 2

## 2023-12-17 MED ORDER — ASPIRIN 81 MG PO CHEW
81.0000 mg | CHEWABLE_TABLET | Freq: Once | ORAL | Status: AC
  Administered 2023-12-17: 81 mg via ORAL

## 2023-12-17 MED ORDER — SODIUM CHLORIDE 0.9 % IV SOLN: INTRAVENOUS | Status: DC

## 2023-12-17 MED ORDER — FENTANYL CITRATE (PF) 100 MCG/2ML IJ SOLN
INTRAMUSCULAR | Status: AC
  Filled 2023-12-17: qty 2

## 2023-12-17 MED ORDER — PEG 3350-KCL-NA BICARB-NACL 420 G PO SOLR: 4000.0000 mL | Freq: Once | ORAL | Status: DC

## 2023-12-17 MED ORDER — HYDRALAZINE HCL 20 MG/ML IJ SOLN: 10.0000 mg | INTRAMUSCULAR | Status: AC | PRN

## 2023-12-17 NOTE — Progress Notes (Addendum)
 LOCATION:  right RADIAL  DEFLATED PER PROTOCOL: YES  TIME BAND OFF/DRESSING APPLIED: 1330, gauze with tegaderm  SITE UPON ARRIVAL: LEVEL 0  SITE AFTER BAND REMOVAL: LEVEL 0  CIRCULATION SENSATION AND MOVEMENT: +2 radial pulse, + movement to right hand  COMMENTS:

## 2023-12-17 NOTE — Progress Notes (Addendum)
 PROGRESS NOTE    Leah Hernandez  GNF:[id number] DOB: [birth year] DOA: 12/12/2023 PCP: Collins Dean, NP   Chief Complaint  Patient presents with   fainted yesterday    Brief Narrative:   Leah Hernandez is a 63 y.o. female with no medical history no regular follow-up presented, she went to PCP for presyncope, workup in ED significant for anemia, globin of 5.1, she went for endoscopy, workup significant for colon mass s/p biopsy, as well noted to have NSVT's, workup significant for low EF 20 to 25%, please see discussion below. 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 - GI input greatly appreciated, status post endoscopy and colonoscopy 5/16, workup significant for transverse colon mass concerning for malignancy s/p biopsy, endoscopy significant for gastritis. - Follow on biopsy results, - Concerning for malignancy, so CT chest/abdomen/pelvis has been obtained, significant for hepatic flexure mass with surrounding mesenteric lymphadenopathy, otherwise no evidence of metastasis . - General Surgery greatly appreciated, plan for partial colectomy, either tomorrow or day after. - Discussed with oncology,Dr. Pasam will arrange for outpatient follow-up versus routine nonurgent consult . -CEA is elevated at 27 - Received 1 dose of IV iron , further p.o. iron  on discharge, and referral  been made for IV iron  at the infusion center.. - Hemoglobin remained stable posttransfusion, continue to monitor closely - Endoscopy significant for gastritis follow-up on biopsy, continue with PPI  Renal insufficiency - No clear baseline, avoid nephrotoxic medications  NSVT -patient had remittent few beats beats of NSVT , asymptomatic, target magnesium  more than 2, potassium more than 4,   New dilated cardiomyopathy - echo came back with low EF 20 to 25% with global hypokinesis. - Neurology input greatly appreciated, - Cardiac cath today with no evidence of CAD.   - Euvolemic - Continue with beta-blockers - Blood pressure is too low for any further GDMT bedside metoprolol  - Cardiac catheter reassuring per cardiology, okay to proceed with surgery.  Left ovarian cyst - Benign appearing, will need Will need repeat ultrasound to 84-month per radiology recommendation  Asymptomatic bacteriuria - He denies any symptoms, no indication to treat  Obesity class II Prediabetes Body mass index is 30.77 kg/m. - She would benefit from low-dose metformin once stable   DVT prophylaxis: subcu Lovenox  code Status: Full code Family Communication: None at bedside,I did update daughter by phone. Disposition:   Status is: Inpatient    Consultants:  Gastroenterology General Surgery Discussed with oncology Dr. Randye Buttner by phone will arrange for outpatient follow-up   Subjective:  was seen and examined prior to her cardiac cath this morning  No nausea, no vomiting, she has been n.p.o., she denies any complaints.  Objective: Vitals:   12/17/23 1047 12/17/23 1100 12/17/23 1200 12/17/23 1300  BP:  107/71 110/71 116/82  Pulse: 95 90 89   Resp: 16 19 20 18   Temp:      TempSrc:  SpO2: 99% 99% 98%   Weight:      Height:        Intake/Output Summary (Last 24 hours) at 12/17/2023 1402 Last data filed at 12/17/2023 0533 Gross per 24 hour  Intake 480 ml  Output --  Net 480 ml    Filed Weights   12/12/23 1642 12/17/23 0633  Weight: 106.9 kg 89.1 kg    Examination:   Awake Alert, Oriented X 3, No new F.N deficits, Normal affect Symmetrical Chest wall movement, Good air movement bilaterally, CTAB RRR,No Gallops,Rubs or new Murmurs, No Parasternal Heave +ve B.Sounds, Abd Soft, No tenderness, No rebound - guarding or rigidity. No Cyanosis, Clubbing or edema, No new Rash or bruise         Data Reviewed: I have personally reviewed following labs and imaging studies  CBC: Recent Labs  Lab 12/12/23 1642 12/13/23 0607 12/13/23 1638 12/14/23 0449 12/14/23 1756 12/15/23 0445 12/16/23 0700 12/17/23 0500  WBC 6.3 6.2  --   --   --  7.0 6.1 7.6  NEUTROABS 3.7  --   --   --   --   --   --   --   HGB 5.1* 6.9*   < > 7.0* 8.8* 8.9* 8.8* 9.0*  HCT 18.7* 22.7*   < > 23.4* 28.6* 29.6* 28.6* 30.0*  MCV 62.5* 67.8*  --   --   --  71.3* 70.8* 72.3*  PLT 512* 406*  --   --   --  392 413* 410*   < > = values in this interval not displayed.    Basic Metabolic Panel: Recent Labs  Lab 12/13/23 0607 12/14/23 0449 12/14/23 1756 12/15/23 0445 12/16/23 0700 12/17/23 0500  NA 138 138  --  137 136 135  K 4.0 3.8  --  3.9 4.3 4.1  CL 111 108  --  107 106 105  CO2 22 22  --  20* 22 22  GLUCOSE 99 95  --  95 84 106*  BUN 9 6*  --  6* 6* 10  CREATININE 1.05* 1.06*  --  1.08* 1.10* 1.28*  CALCIUM 8.9 8.8*  --  9.1 8.9 8.9  MG  --   --  1.8  --   --  2.0    GFR: Estimated Creatinine Clearance: 51.6 mL/min (A) (by C-G formula based on SCr of 1.28 mg/dL (H)).  Liver Function Tests: Recent Labs  Lab 12/12/23 2052  AST 21  ALT 11  ALKPHOS 82  BILITOT 0.5  PROT 6.8  ALBUMIN 2.8*    CBG: Recent Labs  Lab 12/14/23 0727 12/17/23 1047  GLUCAP 85 84     No results found for this or any previous visit (from the past 240 hours).       Radiology Studies: CARDIAC CATHETERIZATION Result Date: 12/17/2023 Conclusions: No angiographically significant  coronary artery disease.  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 Normal left heart filling pressures. Mildly elevated right heart and pulmonary artery pressures. Normal Fick cardiac output/index. Recommendations: Maintain net even fluid balance. Escalate goal-directed medical therapy for management of non-ischemic cardiomyopathy. Sammy Crisp, MD [facility]  ECHOCARDIOGRAM COMPLETE Result Date: 12/16/2023    ECHOCARDIOGRAM REPORT   Patient Name:   Leah Hernandez Date of Exam: 12/16/2023 Medical Rec #:  [id number]         Height:       67.0 in Accession #:    [id number]  Weight:       235.7 lb Date of Birth:  02/19/1961         BSA:          2.168 m Patient Age:    63 years          BP:           111/63 mmHg Patient Gender: F                 HR:           97 bpm. Exam Location:  Inpatient Procedure: 2D Echo, 3D Echo, Cardiac Doppler, Color Doppler and Strain Analysis            (Both Spectral and Color Flow Doppler were utilized during            procedure). Indications:    NSVT (nonsustained ventricular tachycardia)  History:        Patient has no prior history of Echocardiogram examinations.                 Risk Factors:Prediabetes.  Sonographer:    Travis Friedman RDCS Referring Phys: 28 Maralyn Witherell S Lamees Gable IMPRESSIONS  1. Left ventricular ejection fraction, by estimation, is 20 to 25%. Left ventricular ejection fraction by 3D volume is 38 %. The left ventricle has severely decreased function. The left ventricle demonstrates global hypokinesis. The left ventricular internal cavity size was moderately to severely dilated. There is severe asymmetric left ventricular hypertrophy of the lateral segment. Left ventricular diastolic parameters are consistent with Grade I diastolic dysfunction (impaired relaxation). The average left ventricular global longitudinal strain is -9.4 %. The global longitudinal strain is abnormal.  2. Right ventricular systolic function is  normal. The right ventricular size is normal. There is normal pulmonary artery systolic pressure.  3. The mitral valve is normal in structure. No evidence of mitral valve regurgitation. No evidence of mitral stenosis.  4. The aortic valve is tricuspid. Aortic valve regurgitation is not visualized. No aortic stenosis is present.  5. The inferior vena cava is normal in size with greater than 50% respiratory variability, suggesting right atrial pressure of 3 mmHg. Comparison(s): No prior Echocardiogram. FINDINGS  Left Ventricle: Left ventricular ejection fraction, by estimation, is 20 to 25%. Left ventricular ejection fraction by 3D volume is 38 %. The left ventricle has severely decreased function. The left ventricle demonstrates global hypokinesis. The average  left ventricular global longitudinal strain is -9.4 %. Strain was performed and the global longitudinal strain is abnormal. The left ventricular internal cavity size was moderately to severely dilated. There is severe asymmetric left ventricular hypertrophy of the lateral segment. Left ventricular diastolic parameters are consistent with Grade I diastolic dysfunction (impaired relaxation). Right Ventricle: The right ventricular size is normal. No increase in right ventricular wall thickness. Right ventricular systolic function is normal. There is normal pulmonary artery systolic pressure. The tricuspid regurgitant velocity is 2.48 m/s, and  with an assumed right atrial pressure of 3 mmHg, the estimated right ventricular systolic pressure is 27.6 mmHg. Left Atrium: Left atrial size was normal in size. Right Atrium: Right atrial size was normal in size. Pericardium: There is no evidence of pericardial effusion. Mitral Valve: The mitral valve is normal in structure. No evidence of mitral valve regurgitation. No evidence of mitral valve stenosis. Tricuspid Valve: The tricuspid valve is normal in structure. Tricuspid valve regurgitation is mild . No evidence of  tricuspid stenosis. Aortic Valve: The aortic valve is tricuspid. Aortic valve regurgitation  is not visualized. No aortic stenosis is present. Aortic valve mean gradient measures 3.3 mmHg. Aortic valve peak gradient measures 6.5 mmHg. Aortic valve area, by VTI measures 2.97 cm. Pulmonic Valve: The pulmonic valve was not well visualized. Pulmonic valve regurgitation is not visualized. No evidence of pulmonic stenosis. Aorta: The aortic root is normal in size and structure. Venous: The inferior vena cava is normal in size with greater than 50% respiratory variability, suggesting right atrial pressure of 3 mmHg. IAS/Shunts: No atrial level shunt detected by color flow Doppler. Additional Comments: 3D was performed not requiring image post processing on an independent workstation and was indeterminate.  LEFT VENTRICLE PLAX 2D LVIDd:         6.20 cm         Diastology LVIDs:         5.10 cm         LV e' medial:    8.92 cm/s LV PW:         1.50 cm         LV E/e' medial:  11.8 LV IVS:        0.70 cm         LV e' lateral:   8.38 cm/s LVOT diam:     2.50 cm         LV E/e' lateral: 12.5 LV SV:         68 LV SV Index:   31              2D Longitudinal LVOT Area:     4.91 cm        Strain                                2D Strain GLS   -9.4 %                                Avg:                                 3D Volume EF                                LV 3D EF:    Left                                             ventricul                                             ar                                             ejection  fraction                                             by 3D                                             volume is                                             38 %.                                 3D Volume EF:                                3D EF:        38 %                                LV EDV:       157 ml                                LV ESV:       98 ml                                 LV SV:        59 ml RIGHT VENTRICLE             IVC RV Basal diam:  3.20 cm     IVC diam: 1.70 cm RV S prime:     13.50 cm/s TAPSE (M-mode): 2.2 cm LEFT ATRIUM           Index        RIGHT ATRIUM           Index LA diam:      4.50 cm 2.08 cm/m   RA Area:     16.90 cm LA Vol (A2C): 48.8 ml 22.51 ml/m  RA Volume:   40.60 ml  18.72 ml/m LA Vol (A4C): 73.5 ml 33.90 ml/m  AORTIC VALVE AV Area (Vmax):    2.98 cm AV Area (Vmean):   3.01 cm AV Area (VTI):     2.97 cm AV Vmax:           127.33 cm/s AV Vmean:          86.533 cm/s AV VTI:            0.228 m AV Peak Grad:      6.5 mmHg AV Mean Grad:      3.3 mmHg LVOT Vmax:         77.20 cm/s LVOT Vmean:        53.133 cm/s LVOT VTI:          0.138 m LVOT/AV VTI ratio: 0.61  AORTA Ao Root diam: 3.60 cm Ao Asc diam:  3.10 cm MITRAL VALVE                  TRICUSPID VALVE MV Area (PHT): 5.13 cm       TR Peak grad:   24.6 mmHg MV Decel Time: 148 msec       TR Vmax:        248.00 cm/s MR Peak grad:    87.6 mmHg MR Mean grad:    60.0 mmHg    SHUNTS MR Vmax:         468.00 cm/s  Systemic VTI:  0.14 m MR Vmean:        362.0 cm/s   Systemic Diam: 2.50 cm MR PISA:         2.26 cm MR PISA Eff ROA: 19 mm MR PISA Radius:  0.60 cm MV E velocity: 105.00 cm/s MV A velocity: 125.00 cm/s MV E/A ratio:  0.84 Vishnu Priya Mallipeddi Electronically signed by Lucetta Russel Mallipeddi Signature Date/Time: 12/16/2023/1:38:39 PM    Final         Scheduled Meds:  [MAR Hold] sodium chloride    Intravenous Once   acetaminophen   1,000 mg Oral On Call to OR   alvimopan   12 mg Oral On Call to OR   [MAR Hold] bupivacaine  liposome  266 mg Infiltration Once   [MAR Hold] cyanocobalamin   1,000 mcg Subcutaneous Daily   enoxaparin  (LOVENOX ) injection  40 mg Subcutaneous Once   [MAR Hold] feeding supplement  237 mL Oral BID BM   feeding supplement  296 mL Oral Once   feeding supplement  592 mL Oral Once   gabapentin   200 mg Oral On Call to OR   [MAR Hold] metoprolol   tartrate  25 mg Oral BID   neomycin   1,000 mg Oral 3 times per day   And   metroNIDAZOLE   1,000 mg Oral 3 times per day   [MAR Hold] pantoprazole   40 mg Oral BID   Continuous Infusions:  sodium chloride      cefoTEtan  (CEFOTAN ) IV        LOS: 5 days     Seena Dadds, MD [facility]   To contact the attending provider between 7A-7P or the covering provider during after hours 7P-7A, please log into the web site [email or web address] and access using universal [place] password for that web site. If you do not have the password, please call the hospital operator.  12/17/2023, 2:02 PM

## 2023-12-17 NOTE — Interval H&P Note (Signed)
 History and Physical Interval Note:  12/17/2023 8:47 AM  Leah Hernandez  has presented today for surgery, with the diagnosis of hf.  The various methods of treatment have been discussed with the patient and family. After consideration of risks, benefits and other options for treatment, the patient has consented to  Procedure(s): RIGHT/LEFT HEART CATH AND CORONARY ANGIOGRAPHY (N/A) as a surgical intervention.  The patient's history has been reviewed, patient examined, no change in status, stable for surgery.  I have reviewed the patient's chart and labs.  Questions were answered to the patient's satisfaction.    Cath Lab Visit (complete for each Cath Lab visit)  Clinical Evaluation Leading to the Procedure:   ACS: No.  Non-ACS:    Anginal/Heart Failure Classification: NYHA class IV  Anti-ischemic medical therapy:   Non-Invasive Test Results: No ischemia testing performed; LVEF 20-25% by echo -> high risk  Prior CABG: No previous CABG  [name]

## 2023-12-17 NOTE — Telephone Encounter (Signed)
 Patient referred to infusion pharmacy team for ambulatory infusion of IV iron .  Insurance - [place] Medicaid Prepaid  Dx code - D64.9/D50.9  IV Iron  Therapy - Feraheme 510 mg IV x 2  Infusion appointments - [facility] Infusion Team Scheduling team will schedule patient as soon as possible.    Ennifer Harston D. Kayzlee Wirtanen, PharmD

## 2023-12-17 NOTE — Progress Notes (Signed)
 Rounding Note    Patient Name: Leah Hernandez Date of Encounter: 12/17/2023  [facility] Cardiologist: Sheryle Donning, MD (new)  Subjective   Seen after her cath. Reviewed results with her, reassuring. No current pain.  Inpatient Medications    Scheduled Meds:  [MAR Hold] sodium chloride    Intravenous Once   acetaminophen   1,000 mg Oral On Call to OR   alvimopan   12 mg Oral On Call to OR   [facility] Hold] bupivacaine  liposome  266 mg Infiltration Once   [MAR Hold] cyanocobalamin   1,000 mcg Subcutaneous Daily   enoxaparin  (LOVENOX ) injection  40 mg Subcutaneous Once   [MAR Hold] feeding supplement  237 mL Oral BID BM   feeding supplement  296 mL Oral Once   feeding supplement  592 mL Oral Once   gabapentin   200 mg Oral On Call to OR   [facility] Hold] metoprolol  tartrate  25 mg Oral BID   neomycin   1,000 mg Oral 3 times per day   And   metroNIDAZOLE   1,000 mg Oral 3 times per day   [MAR Hold] pantoprazole   40 mg Oral BID   Continuous Infusions:  sodium chloride      cefoTEtan  (CEFOTAN ) IV     PRN Meds: [MAR Hold] acetaminophen , fentaNYL , Heparin  (Porcine) in NaCl, heparin  sodium (porcine), hydrALAZINE , iohexol , lidocaine  (PF), [MAR Hold] melatonin, midazolam , [MAR Hold] ondansetron  **OR** [MAR Hold] ondansetron  (ZOFRAN ) IV, Radial Cocktail/Verapamil  only   Vital Signs    Vitals:   12/17/23 0955 12/17/23 1000 12/17/23 1015 12/17/23 1030  BP: 119/87 (!) 121/90 122/81 113/72  Pulse: 91     Resp: 20 13 13 15   Temp:      TempSrc:      SpO2: (!) 88%     Weight:      Height:        Intake/Output Summary (Last 24 hours) at 12/17/2023 1054 Last data filed at 12/17/2023 0533 Gross per 24 hour  Intake 480 ml  Output --  Net 480 ml      12/17/2023    6:33 AM 12/12/2023    4:42 PM 04/30/2017    8:35 AM  Last 3 Weights  Weight (lbs) 196 lb 6.9 oz 235 lb 10.8 oz 235 lb 9.6 oz  Weight (kg) 89.1 kg 106.9 kg 106.867 kg      Telemetry    SR - Personally  Reviewed  Physical Exam   GEN: No acute distress.   Neck: No JVD Cardiac: RRR, no murmurs, rubs, or gallops.  Respiratory: Clear to auscultation bilaterally. GI: Soft, nontender, non-distended  MS: No edema; No deformity. Neuro:  Nonfocal  Psych: Normal affect   New pertinent results (labs, ECG, imaging, cardiac studies)    Echo, cath images personally reviewed.  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 I personally reviewed her images. RHC with normal CO/CI. Only borderline elevated wedge pressure, agrees with her exam which suggests she is euvolemic -blood pressure too low for any GDMT beyond metoprolol  at this time  NSVT -on metoprolol , consolidate to succinate prior to discharge (ok to leave tartrate perioperatively)  Anemia, severe, requiring transfusion Lower GI bleed Colon mass -concern for colon cancer as etiology -pending hemicolectomy. Discussed with team--from a cardiac perspective, cath today very reassuring. Ok to proceed  with surgery. Will need to be cautious with fluids, hypotension perioperatively.    Signed, Sheryle Donning, MD  12/17/2023, 10:54 AM

## 2023-12-17 NOTE — Plan of Care (Signed)
 Pt has rested quietly throughout the night with no distress noted. Alert and oriented. On room air. SR on the monitor. Up to BR to void. Pt has been NPO since midnight for heart cath. Groins and right wrist clipped. No complaints voiced.     Problem: Education: Goal: Knowledge of General Education information will improve Description: Including pain rating scale, medication(s)/side effects and non-pharmacologic comfort measures Outcome: Progressing   Problem: Health Behavior/Discharge Planning: Goal: Ability to manage health-related needs will improve Outcome: Progressing   Problem: Clinical Measurements: Goal: Respiratory complications will improve Outcome: Progressing Goal: Cardiovascular complication will be avoided Outcome: Progressing   Problem: Activity: Goal: Risk for activity intolerance will decrease Outcome: Progressing   Problem: Pain Managment: Goal: General experience of comfort will improve and/or be controlled Outcome: Progressing

## 2023-12-17 NOTE — Progress Notes (Signed)
 * Day of Surgery *  Subjective: CC: S/p heart cath today.  She reports she ate meat, veggies and fruit last night for dinner.  She just got done eating meatloaf, mac and cheese, broccoli, grapes and salad.  No abdominal pain, n/v. Passing flatus. No bm in 2d. Reports she tolerated prep well.   Objective: Vital signs in last 24 hours: Temp:  [98.5 F (36.9 C)-99.6 F (37.6 C)] 98.5 F (36.9 C) (05/19 0534) Pulse Rate:  [89-109] 89 (05/19 1200) Resp:  [12-20] 18 (05/19 1300) BP: (106-133)/(67-90) 116/82 (05/19 1300) SpO2:  [88 %-100 %] 98 % (05/19 1200) Weight:  [89.1 kg] 89.1 kg (05/19 0633) Last BM Date : 12/13/23  Intake/Output from previous day: 05/18 0701 - 05/19 0700 In: 720 [P.O.:720] Out: -  Intake/Output this shift: No intake/output data recorded.  PE: Gen:  Alert, NAD, pleasant Abd: Soft, ND, NT, +BS  Lab Results:  Recent Labs    12/16/23 0700 12/17/23 0500  WBC 6.1 7.6  HGB 8.8* 9.0*  HCT 28.6* 30.0*  PLT 413* 410*   BMET Recent Labs    12/16/23 0700 12/17/23 0500  NA 136 135  K 4.3 4.1  CL 106 105  CO2 22 22  GLUCOSE 84 106*  BUN 6* 10  CREATININE 1.10* 1.28*  CALCIUM 8.9 8.9   PT/INR No results for input(s): "LABPROT", "INR" in the last 72 hours. CMP     Component Value Date/Time   NA 135 12/17/2023 0500   NA 140 09/29/2019 1449   K 4.1 12/17/2023 0500   CL 105 12/17/2023 0500   CO2 22 12/17/2023 0500   GLUCOSE 106 (H) 12/17/2023 0500   BUN 10 12/17/2023 0500   BUN 17 09/29/2019 1449   CREATININE 1.28 (H) 12/17/2023 0500   CALCIUM 8.9 12/17/2023 0500   PROT 6.8 12/12/2023 2052   PROT 7.6 09/29/2019 1449   ALBUMIN 2.8 (L) 12/12/2023 2052   ALBUMIN 4.6 09/29/2019 1449   AST 21 12/12/2023 2052   ALT 11 12/12/2023 2052   ALKPHOS 82 12/12/2023 2052   BILITOT 0.5 12/12/2023 2052   BILITOT 0.3 09/29/2019 1449   GFRNONAA 47 (L) 12/17/2023 0500   GFRAA 70 09/29/2019 1449   Lipase  No results found for:  "LIPASE"  Studies/Results: CARDIAC CATHETERIZATION Result Date: 12/17/2023 Conclusions: No angiographically significant coronary artery disease.  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 Normal left heart filling pressures. Mildly elevated right heart and pulmonary artery pressures. Normal Fick cardiac output/index. Recommendations: Maintain net even fluid balance. Escalate goal-directed medical therapy for management of non-ischemic cardiomyopathy. Sammy Crisp, MD [facility]  ECHOCARDIOGRAM COMPLETE Result Date: 12/16/2023    ECHOCARDIOGRAM REPORT   Patient Name:   Leah Hernandez Date of Exam: 12/16/2023 Medical Rec #:  [id number]         Height:       67.0 in Accession #:    [id number]        Weight:       235.7 lb Date of Birth:  17-Jan-1961         BSA:          2.168 m Patient Age:    63 years          BP:           111/63 mmHg Patient Gender: F                 HR:  97 bpm. Exam Location:  Inpatient Procedure: 2D Echo, 3D Echo, Cardiac Doppler, Color Doppler and Strain Analysis            (Both Spectral and Color Flow Doppler were utilized during            procedure). Indications:    NSVT (nonsustained ventricular tachycardia)  History:        Patient has no prior history of Echocardiogram examinations.                 Risk Factors:Prediabetes.  Sonographer:    Travis Friedman RDCS Referring Phys: [id number] [address]WO[address] S ELGER[place]WY IMPRESSIONS  1. Left ventricular ejection fraction, by estimation, is 20 to 25%. Left ventricular ejection fraction by 3D volume is 38 %. The left ventricle has severely decreased function. The left ventricle demonstrates global hypokinesis. The left ventricular internal cavity size was moderately to severely dilated. There is severe asymmetric left ventricular hypertrophy of the lateral segment. Left ventricular diastolic parameters are consistent with Grade I diastolic dysfunction (impaired relaxation). The average left ventricular  global longitudinal strain is -9.4 %. The global longitudinal strain is abnormal.  2. Right ventricular systolic function is normal. The right ventricular size is normal. There is normal pulmonary artery systolic pressure.  3. The mitral valve is normal in structure. No evidence of mitral valve regurgitation. No evidence of mitral stenosis.  4. The aortic valve is tricuspid. Aortic valve regurgitation is not visualized. No aortic stenosis is present.  5. The inferior vena cava is normal in size with greater than 50% respiratory variability, suggesting right atrial pressure of 3 mmHg. Comparison(s): No prior Echocardiogram. FINDINGS  Left Ventricle: Left ventricular ejection fraction, by estimation, is 20 to 25%. Left ventricular ejection fraction by 3D volume is 38 %. The left ventricle has severely decreased function. The left ventricle demonstrates global hypokinesis. The average  left ventricular global longitudinal strain is -9.4 %. Strain was performed and the global longitudinal strain is abnormal. The left ventricular internal cavity size was moderately to severely dilated. There is severe asymmetric left ventricular hypertrophy of the lateral segment. Left ventricular diastolic parameters are consistent with Grade I diastolic dysfunction (impaired relaxation). Right Ventricle: The right ventricular size is normal. No increase in right ventricular wall thickness. Right ventricular systolic function is normal. There is normal pulmonary artery systolic pressure. The tricuspid regurgitant velocity is 2.48 m/s, and  with an assumed right atrial pressure of 3 mmHg, the estimated right ventricular systolic pressure is 27.6 mmHg. Left Atrium: Left atrial size was normal in size. Right Atrium: Right atrial size was normal in size. Pericardium: There is no evidence of pericardial effusion. Mitral Valve: The mitral valve is normal in structure. No evidence of mitral valve regurgitation. No evidence of mitral valve  stenosis. Tricuspid Valve: The tricuspid valve is normal in structure. Tricuspid valve regurgitation is mild . No evidence of tricuspid stenosis. Aortic Valve: The aortic valve is tricuspid. Aortic valve regurgitation is not visualized. No aortic stenosis is present. Aortic valve mean gradient measures 3.3 mmHg. Aortic valve peak gradient measures 6.5 mmHg. Aortic valve area, by VTI measures 2.97 cm. Pulmonic Valve: The pulmonic valve was not well visualized. Pulmonic valve regurgitation is not visualized. No evidence of pulmonic stenosis. Aorta: The aortic root is normal in size and structure. Venous: The inferior vena cava is normal in size with greater than 50% respiratory variability, suggesting right atrial pressure of 3 mmHg. IAS/Shunts: No atrial level shunt detected by color flow Doppler. Additional  Comments: 3D was performed not requiring image post processing on an independent workstation and was indeterminate.  LEFT VENTRICLE PLAX 2D LVIDd:         6.20 cm         Diastology LVIDs:         5.10 cm         LV e' medial:    8.92 cm/s LV PW:         1.50 cm         LV E/e' medial:  11.8 LV IVS:        0.70 cm         LV e' lateral:   8.38 cm/s LVOT diam:     2.50 cm         LV E/e' lateral: 12.5 LV SV:         68 LV SV Index:   31              2D Longitudinal LVOT Area:     4.91 cm        Strain                                2D Strain GLS   -9.4 %                                Avg:                                 3D Volume EF                                LV 3D EF:    Left                                             ventricul                                             ar                                             ejection                                             fraction                                             by 3D  volume is                                             38 %.                                 3D Volume EF:                                 3D EF:        38 %                                LV EDV:       157 ml                                LV ESV:       98 ml                                LV SV:        59 ml RIGHT VENTRICLE             IVC RV Basal diam:  3.20 cm     IVC diam: 1.70 cm RV S prime:     13.50 cm/s TAPSE (M-mode): 2.2 cm LEFT ATRIUM           Index        RIGHT ATRIUM           Index LA diam:      4.50 cm 2.08 cm/m   RA Area:     16.90 cm LA Vol (A2C): 48.8 ml 22.51 ml/m  RA Volume:   40.60 ml  18.72 ml/m LA Vol (A4C): 73.5 ml 33.90 ml/m  AORTIC VALVE AV Area (Vmax):    2.98 cm AV Area (Vmean):   3.01 cm AV Area (VTI):     2.97 cm AV Vmax:           127.33 cm/s AV Vmean:          86.533 cm/s AV VTI:            0.228 m AV Peak Grad:      6.5 mmHg AV Mean Grad:      3.3 mmHg LVOT Vmax:         77.20 cm/s LVOT Vmean:        53.133 cm/s LVOT VTI:          0.138 m LVOT/AV VTI ratio: 0.61  AORTA Ao Root diam: 3.60 cm Ao Asc diam:  3.10 cm MITRAL VALVE                  TRICUSPID VALVE MV Area (PHT): 5.13 cm       TR Peak grad:   24.6 mmHg MV Decel Time: 148 msec       TR Vmax:        248.00 cm/s MR Peak grad:    87.6 mmHg MR Mean grad:    60.0 mmHg    SHUNTS MR Vmax:  468.00 cm/s  Systemic VTI:  0.14 m MR Vmean:        362.0 cm/s   Systemic Diam: 2.50 cm MR PISA:         2.26 cm MR PISA Eff ROA: 19 mm MR PISA Radius:  0.60 cm MV E velocity: 105.00 cm/s MV A velocity: 125.00 cm/s MV E/A ratio:  0.84 Vishnu Priya Mallipeddi Electronically signed by Lucetta Russel Mallipeddi Signature Date/Time: 12/16/2023/1:38:39 PM    Final     Anti-infectives: Anti-infectives (From admission, onward)    Start     Dose/Rate Route Frequency Ordered Stop   12/17/23 0600  cefoTEtan  (CEFOTAN ) 2 g in sodium chloride  0.9 % 100 mL IVPB        2 g 200 mL/hr over 30 Minutes Intravenous On call to O.R. 12/16/23 1421 12/18/23 0559   12/16/23 1515  neomycin  (MYCIFRADIN ) tablet 1,000 mg       Placed in "And" Linked Group   1,000 mg Oral 3 times per  day 12/16/23 1421 12/17/23 1459   12/16/23 1515  metroNIDAZOLE  (FLAGYL ) tablet 1,000 mg       Placed in "And" Linked Group   1,000 mg Oral 3 times per day 12/16/23 1421 12/17/23 1459        Assessment/Plan Transverse colon mass ABL/IDA anemia  - Colonoscopy 5/16 w/ a circumferential partially obstructing transverse colon mass. Also with polyp in the rectum and proximal ascending colon  - Path from colonoscopy pending - CT A/P w/ mass in the hepatic flexure of the colon. There are pericolonic and RUQ mesenteric lymphadenopathy  - CT chest without other sites of mets - CEA 27.6 - Hgb 5.1 on admission --> s/p 3U PRBC. 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 S/p cath 5/19. Per cards notes, okay to proceed w/ surgery but will need to be cautious with fluids, hypotension peri-operatively.  - Appears primary team has already contacted Oncology, Dr. Randye Buttner  - Recommend laparoscopic extended R hemicolectomy. The planned procedure and material risks were discussed with the patient. Risks include but are not limited to anesthesia (MI, CVA, prolonged intubation, aspiration, death), pain, bleeding, infection, scarring, hernia, damage to surrounding structures (blood vessels/nerves/viscus/organs/ureter), ileus, leak from anastomosis, post-operative abscess, DVT/PE and possible need for stoma/ileostomy. We also discussed typical post-operative care including the possible need for rehab if necessary. The patient's questions were answered to their satisfaction, they voiced understanding and elected to proceed with surgery. Her daughters were also present in person/over speaker phone and questions were answered. Patient was eating regular food last night and today. Will need to repeat prep tonight before surgery. Will plan this tentatively tomorrow with Dr. Davonna Estes pending OR availability.   FEN - Do not advance past liquids. NPO  midnight. IVF per primary  VTE - SCDs, okay for chem ppx from a general surgery standpoint  ID - Cefotetan  peri-op abx.   3.3 cm benign-appearing left ovarian cyst. Radiology recommended follow-up US  in 6-12 months.    I reviewed nursing notes, Consultant (GI, Cards) notes, hospitalist notes, last 24 h vitals and pain scores, last 48 h intake and output, last 24 h labs and trends, and last 24 h imaging results.   LOS: 5 days    Delton Filbert, [facility] Surgery 12/17/2023, 1:39 PM Please see Amion for pager number during day hours 7:00am-4:30pm

## 2023-12-18 ENCOUNTER — Ambulatory Visit: Payer: Self-pay | Admitting: General Surgery

## 2023-12-18 ENCOUNTER — Encounter (HOSPITAL_COMMUNITY): Payer: Self-pay | Admitting: Internal Medicine

## 2023-12-18 ENCOUNTER — Encounter (HOSPITAL_COMMUNITY)
Admission: EM | Disposition: A | Discharge status: Home / Self Care | Payer: Self-pay | Source: Home / Self Care | Attending: Internal Medicine

## 2023-12-18 ENCOUNTER — Inpatient Hospital Stay (HOSPITAL_COMMUNITY)

## 2023-12-18 ENCOUNTER — Ambulatory Visit: Payer: Self-pay | Admitting: Pediatrics

## 2023-12-18 ENCOUNTER — Other Ambulatory Visit: Payer: Self-pay

## 2023-12-18 DIAGNOSIS — R7303 Prediabetes: Secondary | ICD-10-CM | POA: Diagnosis not present

## 2023-12-18 DIAGNOSIS — C189 Malignant neoplasm of colon, unspecified: Secondary | ICD-10-CM | POA: Diagnosis not present

## 2023-12-18 DIAGNOSIS — D509 Iron deficiency anemia, unspecified: Secondary | ICD-10-CM | POA: Diagnosis not present

## 2023-12-18 DIAGNOSIS — D128 Benign neoplasm of rectum: Secondary | ICD-10-CM | POA: Diagnosis not present

## 2023-12-18 DIAGNOSIS — I5021 Acute systolic (congestive) heart failure: Secondary | ICD-10-CM

## 2023-12-18 DIAGNOSIS — D122 Benign neoplasm of ascending colon: Secondary | ICD-10-CM | POA: Diagnosis not present

## 2023-12-18 DIAGNOSIS — N179 Acute kidney failure, unspecified: Secondary | ICD-10-CM | POA: Diagnosis not present

## 2023-12-18 DIAGNOSIS — Z6831 Body mass index (BMI) 31.0-31.9, adult: Secondary | ICD-10-CM | POA: Diagnosis not present

## 2023-12-18 DIAGNOSIS — C184 Malignant neoplasm of transverse colon: Secondary | ICD-10-CM | POA: Diagnosis not present

## 2023-12-18 DIAGNOSIS — E66812 Obesity, class 2: Secondary | ICD-10-CM | POA: Diagnosis not present

## 2023-12-18 DIAGNOSIS — C183 Malignant neoplasm of hepatic flexure: Secondary | ICD-10-CM | POA: Diagnosis not present

## 2023-12-18 DIAGNOSIS — K2950 Unspecified chronic gastritis without bleeding: Secondary | ICD-10-CM | POA: Diagnosis not present

## 2023-12-18 HISTORY — PX: LAPAROSCOPIC PARTIAL COLECTOMY: SHX5907

## 2023-12-18 LAB — BASIC METABOLIC PANEL WITH GFR
Anion gap: 9 (ref 5–15)
BUN: 9 mg/dL (ref 8–23)
CO2: 23 mmol/L (ref 22–32)
Calcium: 9.1 mg/dL (ref 8.9–10.3)
Chloride: 107 mmol/L (ref 98–111)
Creatinine, Ser: 1.07 mg/dL — ABNORMAL HIGH (ref 0.44–1.00)
GFR, Estimated: 58 mL/min — ABNORMAL LOW (ref >60)
Glucose, Bld: 101 mg/dL — ABNORMAL HIGH (ref 70–99)
Potassium: 4.4 mmol/L (ref 3.5–5.1)
Sodium: 139 mmol/L (ref 135–145)

## 2023-12-18 LAB — CBC
HCT: 29.9 % — ABNORMAL LOW (ref 36.0–46.0)
Hemoglobin: 8.9 g/dL — ABNORMAL LOW (ref 12.0–15.0)
MCH: 21.5 pg — ABNORMAL LOW (ref 26.0–34.0)
MCHC: 29.8 g/dL — ABNORMAL LOW (ref 30.0–36.0)
MCV: 72.4 fL — ABNORMAL LOW (ref 80.0–100.0)
Platelets: 404 10*3/uL — ABNORMAL HIGH (ref 150–400)
RBC: 4.13 MIL/uL (ref 3.87–5.11)
RDW: 29.7 % — ABNORMAL HIGH (ref 11.5–15.5)
WBC: 6.3 10*3/uL (ref 4.0–10.5)
nRBC: 0 % (ref 0.0–0.2)

## 2023-12-18 LAB — PREPARE RBC (CROSSMATCH)

## 2023-12-18 SURGERY — LAPAROSCOPIC PARTIAL COLECTOMY
Anesthesia: General | Laterality: Right

## 2023-12-18 MED ORDER — CHLORHEXIDINE GLUCONATE CLOTH 2 % EX PADS: 6.0000 | MEDICATED_PAD | Freq: Once | CUTANEOUS | Status: DC

## 2023-12-18 MED ORDER — DEXAMETHASONE SODIUM PHOSPHATE 10 MG/ML IJ SOLN
INTRAMUSCULAR | Status: AC
  Filled 2023-12-18: qty 1

## 2023-12-18 MED ORDER — PROPOFOL 10 MG/ML IV BOLUS
INTRAVENOUS | Status: AC
Start: 2023-12-18 — End: ?
  Filled 2023-12-18: qty 20

## 2023-12-18 MED ORDER — LACTATED RINGERS IV SOLN: INTRAVENOUS | Status: DC

## 2023-12-18 MED ORDER — PROPOFOL 500 MG/50ML IV EMUL
INTRAVENOUS | Status: DC | PRN
  Administered 2023-12-18: 100 ug/kg/min via INTRAVENOUS

## 2023-12-18 MED ORDER — NOREPINEPHRINE BITARTRATE 1 MG/ML IV SOLN
INTRAVENOUS | Status: DC | PRN
  Administered 2023-12-18 (×3): 1 mL via INTRAVENOUS
  Administered 2023-12-18: 2 mL via INTRAVENOUS
  Administered 2023-12-18 (×2): 1 mL via INTRAVENOUS
  Administered 2023-12-18: 2 mL via INTRAVENOUS
  Administered 2023-12-18 (×2): 1 mL via INTRAVENOUS

## 2023-12-18 MED ORDER — HYDROMORPHONE HCL 1 MG/ML IJ SOLN: 1.0000 mg | INTRAMUSCULAR | Status: DC | PRN

## 2023-12-18 MED ORDER — BUPIVACAINE-EPINEPHRINE (PF) 0.25% -1:200000 IJ SOLN
INTRAMUSCULAR | Status: AC
Start: 2023-12-18 — End: ?
  Filled 2023-12-18: qty 30

## 2023-12-18 MED ORDER — FENTANYL CITRATE (PF) 250 MCG/5ML IJ SOLN
INTRAMUSCULAR | Status: AC
Start: 2023-12-18 — End: ?
  Filled 2023-12-18: qty 5

## 2023-12-18 MED ORDER — PHENYLEPHRINE HCL-NACL 20-0.9 MG/250ML-% IV SOLN
INTRAVENOUS | Status: DC | PRN
  Administered 2023-12-18: 20 ug/min via INTRAVENOUS

## 2023-12-18 MED ORDER — CHLORHEXIDINE GLUCONATE 0.12 % MT SOLN: 15.0000 mL | Freq: Once | OROMUCOSAL | Status: DC

## 2023-12-18 MED ORDER — ORAL CARE MOUTH RINSE: 15.0000 mL | Freq: Once | OROMUCOSAL | Status: DC

## 2023-12-18 MED ORDER — SODIUM CHLORIDE 0.9 % IR SOLN
Status: DC | PRN
  Administered 2023-12-18: 1000 mL

## 2023-12-18 MED ORDER — SUGAMMADEX SODIUM 200 MG/2ML IV SOLN
INTRAVENOUS | Status: DC | PRN
  Administered 2023-12-18: 200 mg via INTRAVENOUS

## 2023-12-18 MED ORDER — LIDOCAINE 2% (20 MG/ML) 5 ML SYRINGE
INTRAMUSCULAR | Status: AC
  Filled 2023-12-18: qty 5

## 2023-12-18 MED ORDER — SODIUM CHLORIDE 0.9 % IV SOLN
2.0000 g | INTRAVENOUS | Status: AC
  Administered 2023-12-18: 2 g via INTRAVENOUS
  Filled 2023-12-18: qty 2

## 2023-12-18 MED ORDER — ENOXAPARIN SODIUM 40 MG/0.4ML IJ SOSY
40.0000 mg | PREFILLED_SYRINGE | Freq: Once | INTRAMUSCULAR | Status: AC
  Administered 2023-12-18: 40 mg via SUBCUTANEOUS
  Filled 2023-12-18: qty 0.4

## 2023-12-18 MED ORDER — PROPOFOL 10 MG/ML IV BOLUS
INTRAVENOUS | Status: AC
  Filled 2023-12-18: qty 20

## 2023-12-18 MED ORDER — ACETAMINOPHEN 500 MG PO TABS
ORAL_TABLET | ORAL | Status: AC
Start: 2023-12-18 — End: 2023-12-18
  Filled 2023-12-18: qty 2

## 2023-12-18 MED ORDER — CHLORHEXIDINE GLUCONATE 0.12 % MT SOLN
OROMUCOSAL | Status: AC
Start: 2023-12-18 — End: 2023-12-18
  Administered 2023-12-18: 15 mL
  Filled 2023-12-18: qty 15

## 2023-12-18 MED ORDER — SODIUM CHLORIDE 0.9 % IV SOLN: 510.0000 mg | INTRAVENOUS | Status: DC

## 2023-12-18 MED ORDER — DEXAMETHASONE SODIUM PHOSPHATE 10 MG/ML IJ SOLN
INTRAMUSCULAR | Status: DC | PRN
  Administered 2023-12-18: 10 mg via INTRAVENOUS

## 2023-12-18 MED ORDER — ETOMIDATE 2 MG/ML IV SOLN
INTRAVENOUS | Status: AC
  Filled 2023-12-18: qty 10

## 2023-12-18 MED ORDER — GABAPENTIN 300 MG PO CAPS: 300.0000 mg | ORAL_CAPSULE | ORAL | Status: AC

## 2023-12-18 MED ORDER — OXYCODONE HCL 5 MG PO TABS
5.0000 mg | ORAL_TABLET | Freq: Four times a day (QID) | ORAL | Status: DC | PRN
  Administered 2023-12-19: 5 mg via ORAL
  Filled 2023-12-18: qty 1

## 2023-12-18 MED ORDER — SODIUM CHLORIDE 0.9 % IV SOLN: INTRAVENOUS | Status: DC | PRN

## 2023-12-18 MED ORDER — DEXMEDETOMIDINE HCL IN NACL 80 MCG/20ML IV SOLN
INTRAVENOUS | Status: DC | PRN
  Administered 2023-12-18 (×3): 4 ug via INTRAVENOUS

## 2023-12-18 MED ORDER — ONDANSETRON HCL 4 MG/2ML IJ SOLN
INTRAMUSCULAR | Status: AC
  Filled 2023-12-18: qty 2

## 2023-12-18 MED ORDER — CELECOXIB 200 MG PO CAPS
ORAL_CAPSULE | ORAL | Status: AC
Start: 2023-12-18 — End: 2023-12-18
  Administered 2023-12-18: 200 mg via ORAL
  Filled 2023-12-18: qty 1

## 2023-12-18 MED ORDER — FENTANYL CITRATE (PF) 250 MCG/5ML IJ SOLN
INTRAMUSCULAR | Status: DC | PRN
  Administered 2023-12-18: 25 ug via INTRAVENOUS
  Administered 2023-12-18: 100 ug via INTRAVENOUS
  Administered 2023-12-18: 25 ug via INTRAVENOUS

## 2023-12-18 MED ORDER — MIDAZOLAM HCL 2 MG/2ML IJ SOLN
INTRAMUSCULAR | Status: AC
  Filled 2023-12-18: qty 2

## 2023-12-18 MED ORDER — LIDOCAINE 2% (20 MG/ML) 5 ML SYRINGE
INTRAMUSCULAR | Status: DC | PRN
  Administered 2023-12-18: 60 mg via INTRAVENOUS

## 2023-12-18 MED ORDER — ONDANSETRON HCL 4 MG/2ML IJ SOLN
INTRAMUSCULAR | Status: DC | PRN
  Administered 2023-12-18: 4 mg via INTRAVENOUS

## 2023-12-18 MED ORDER — GABAPENTIN 300 MG PO CAPS
ORAL_CAPSULE | ORAL | Status: AC
  Administered 2023-12-18: 300 mg via ORAL
  Filled 2023-12-18: qty 1

## 2023-12-18 MED ORDER — CELECOXIB 200 MG PO CAPS
200.0000 mg | ORAL_CAPSULE | ORAL | Status: AC
Start: 2023-12-18 — End: 2023-12-18

## 2023-12-18 MED ORDER — MIDAZOLAM HCL 2 MG/2ML IJ SOLN
INTRAMUSCULAR | Status: DC | PRN
Start: 2023-12-18 — End: 2023-12-18
  Administered 2023-12-18 (×2): 1 mg via INTRAVENOUS

## 2023-12-18 MED ORDER — HYDROMORPHONE HCL 1 MG/ML IJ SOLN
INTRAMUSCULAR | Status: AC
  Filled 2023-12-18: qty 0.5

## 2023-12-18 MED ORDER — ROCURONIUM BROMIDE 10 MG/ML (PF) SYRINGE
PREFILLED_SYRINGE | INTRAVENOUS | Status: AC
  Filled 2023-12-18: qty 10

## 2023-12-18 MED ORDER — ROCURONIUM BROMIDE 10 MG/ML (PF) SYRINGE
PREFILLED_SYRINGE | INTRAVENOUS | Status: DC | PRN
  Administered 2023-12-18: 70 mg via INTRAVENOUS
  Administered 2023-12-18: 20 mg via INTRAVENOUS

## 2023-12-18 MED ORDER — ALBUMIN HUMAN 5 % IV SOLN: INTRAVENOUS | Status: DC | PRN

## 2023-12-18 MED ORDER — PROPOFOL 10 MG/ML IV BOLUS
INTRAVENOUS | Status: DC | PRN
  Administered 2023-12-18: 20 mg via INTRAVENOUS
  Administered 2023-12-18: 70 mg via INTRAVENOUS

## 2023-12-18 MED ORDER — ACETAMINOPHEN 500 MG PO TABS
1000.0000 mg | ORAL_TABLET | ORAL | Status: AC
  Administered 2023-12-18: 1000 mg via ORAL

## 2023-12-18 MED ORDER — BUPIVACAINE-EPINEPHRINE 0.25% -1:200000 IJ SOLN
INTRAMUSCULAR | Status: DC | PRN
  Administered 2023-12-18: 30 mL

## 2023-12-18 MED ORDER — PHENYLEPHRINE HCL (PRESSORS) 10 MG/ML IV SOLN
INTRAVENOUS | Status: DC | PRN
  Administered 2023-12-18: 40 ug via INTRAVENOUS
  Administered 2023-12-18 (×2): 80 ug via INTRAVENOUS
  Administered 2023-12-18: 40 ug via INTRAVENOUS

## 2023-12-18 SURGICAL SUPPLY — 73 items
BAG COUNTER SPONGE SURGICOUNT (BAG) ×1 IMPLANT
BLADE CLIPPER SURG (BLADE) IMPLANT
CANISTER SUCTION 3000ML PPV (SUCTIONS) ×1 IMPLANT
CELLS DAT CNTRL 66122 CELL SVR (MISCELLANEOUS) IMPLANT
CHLORAPREP W/TINT 26 (MISCELLANEOUS) ×1 IMPLANT
CLIP APPLIE ROT 10 11.4 M/L (STAPLE) IMPLANT
COVER MAYO STAND STRL (DRAPES) ×1 IMPLANT
COVER SURGICAL LIGHT HANDLE (MISCELLANEOUS) ×2 IMPLANT
DERMABOND ADVANCED .7 DNX12 (GAUZE/BANDAGES/DRESSINGS) IMPLANT
DRAPE HALF SHEET 40X57 (DRAPES) IMPLANT
DRAPE UTILITY XL STRL (DRAPES) ×1 IMPLANT
DRAPE WARM FLUID 44X44 (DRAPES) ×1 IMPLANT
DRSG OPSITE POSTOP 4X10 (GAUZE/BANDAGES/DRESSINGS) IMPLANT
DRSG OPSITE POSTOP 4X8 (GAUZE/BANDAGES/DRESSINGS) IMPLANT
ELECT BLADE 6.5 EXT (BLADE) ×1 IMPLANT
ELECT CAUTERY BLADE 6.4 (BLADE) ×1 IMPLANT
ELECTRODE REM PT RTRN 9FT ADLT (ELECTROSURGICAL) ×1 IMPLANT
GEL ULTRASOUND 20GR AQUASONIC (MISCELLANEOUS) IMPLANT
GLOVE BIO SURGEON STRL SZ 6.5 (GLOVE) IMPLANT
GLOVE BIO SURGEON STRL SZ8 (GLOVE) ×2 IMPLANT
GLOVE BIOGEL PI IND STRL 8 (GLOVE) ×2 IMPLANT
GLOVE ECLIPSE 7.0 STRL STRAW (GLOVE) IMPLANT
GOWN STRL REUS W/ TWL LRG LVL3 (GOWN DISPOSABLE) ×6 IMPLANT
GOWN STRL REUS W/ TWL XL LVL3 (GOWN DISPOSABLE) ×2 IMPLANT
HANDLE SUCTION POOLE (INSTRUMENTS) ×1 IMPLANT
IRRIGATION SUCT STRKRFLW 2 WTP (MISCELLANEOUS) IMPLANT
KIT BASIN OR (CUSTOM PROCEDURE TRAY) ×1 IMPLANT
KIT SIGMOIDOSCOPE (SET/KITS/TRAYS/PACK) IMPLANT
LEGGING LITHOTOMY PAIR STRL (DRAPES) IMPLANT
NS IRRIG 1000ML POUR BTL (IV SOLUTION) ×2 IMPLANT
PAD ARMBOARD POSITIONER FOAM (MISCELLANEOUS) ×2 IMPLANT
PAD POSITIONING PINK XL (MISCELLANEOUS) IMPLANT
PENCIL SMOKE EVACUATOR (MISCELLANEOUS) ×2 IMPLANT
RELOAD PROXIMATE 75MM BLUE (ENDOMECHANICALS) ×2 IMPLANT
RELOAD STAPLE 75 3.8 BLU REG (ENDOMECHANICALS) IMPLANT
RETRACTOR WND ALEXIS 18 MED (MISCELLANEOUS) IMPLANT
SCISSORS LAP 5X35 DISP (ENDOMECHANICALS) ×1 IMPLANT
SEALER TISSUE G2 STRG ARTC 35C (ENDOMECHANICALS) IMPLANT
SET TUBE SMOKE EVAC HIGH FLOW (TUBING) ×1 IMPLANT
SHEARS HARMONIC ACE PLUS 36CM (ENDOMECHANICALS) ×1 IMPLANT
SLEEVE Z-THREAD 5X100MM (TROCAR) ×1 IMPLANT
SPECIMEN JAR LARGE (MISCELLANEOUS) ×1 IMPLANT
SPONGE T-LAP 18X18 ~~LOC~~+RFID (SPONGE) IMPLANT
STAPLER GUN LINEAR PROX 60 (STAPLE) IMPLANT
STAPLER PROXIMATE 75MM BLUE (STAPLE) IMPLANT
STAPLER VISISTAT 35W (STAPLE) ×1 IMPLANT
SURGILUBE 2OZ TUBE FLIPTOP (MISCELLANEOUS) IMPLANT
SUT MNCRL AB 4-0 PS2 18 (SUTURE) IMPLANT
SUT PDS AB 1 TP1 54 (SUTURE) IMPLANT
SUT PDS AB 1 TP1 96 (SUTURE) ×2 IMPLANT
SUT PROLENE 2 0 CT2 30 (SUTURE) IMPLANT
SUT PROLENE 2 0 KS (SUTURE) IMPLANT
SUT SILK 2 0 SH CR/8 (SUTURE) ×1 IMPLANT
SUT SILK 2 0 TIES 10X30 (SUTURE) ×1 IMPLANT
SUT SILK 3 0 SH CR/8 (SUTURE) ×1 IMPLANT
SUT SILK 3 0 TIES 10X30 (SUTURE) ×1 IMPLANT
SUT VIC AB 3-0 SH 27X BRD (SUTURE) IMPLANT
SUT VIC AB 3-0 SH 27XBRD (SUTURE) IMPLANT
SYR BULB IRRIG 60ML STRL (SYRINGE) ×1 IMPLANT
SYSTEM LAPSCP GELPORT 120MM (MISCELLANEOUS) IMPLANT
SYSTEM WOUND ALEXIS 18CM MED (MISCELLANEOUS) IMPLANT
TOWEL GREEN STERILE (TOWEL DISPOSABLE) ×2 IMPLANT
TRAY FOLEY MTR SLVR 16FR STAT (SET/KITS/TRAYS/PACK) ×1 IMPLANT
TRAY LAPAROSCOPIC MC (CUSTOM PROCEDURE TRAY) ×1 IMPLANT
TROCAR 11X100 Z THREAD (TROCAR) IMPLANT
TROCAR BALLN 12MMX100 BLUNT (TROCAR) IMPLANT
TROCAR Z THREAD OPTICAL 12X100 (TROCAR) IMPLANT
TROCAR Z-THREAD OPTICAL 5X100M (TROCAR) ×1 IMPLANT
TUBE CONNECTING 12X1/4 (SUCTIONS) ×2 IMPLANT
TUBING EVAC SMOKE HEATED PNEUM (TUBING) ×1 IMPLANT
WARMER LAPAROSCOPE (MISCELLANEOUS) ×1 IMPLANT
WATER STERILE IRR 1000ML POUR (IV SOLUTION) ×1 IMPLANT
YANKAUER SUCT BULB TIP NO VENT (SUCTIONS) ×2 IMPLANT

## 2023-12-18 NOTE — Assessment & Plan Note (Signed)
 Fasting glucose this am 101 mg.dl

## 2023-12-18 NOTE — Progress Notes (Signed)
 * Day of Surgery *  Subjective: Underwent heart cath yesterday, which did not reveal and major lesions. No intervention performed. She completed a colon prep without issue and denies current complaints.   Objective: Vital signs in last 24 hours: Temp:  [98 F (36.7 C)-98.7 F (37.1 C)] 98.7 F (37.1 C) (05/20 0754) Pulse Rate:  [89-109] 104 (05/20 0754) Resp:  [12-20] 18 (05/20 0754) BP: (99-133)/(64-90) 113/71 (05/20 0754) SpO2:  [88 %-100 %] 100 % (05/20 0754) Weight:  [88.6 kg-89.1 kg] 89.1 kg (05/20 0754) Last BM Date : 12/15/23  Intake/Output from previous day: 05/19 0701 - 05/20 0700 In: 850 [P.O.:850] Out: 500 [Emesis/NG output:500] Intake/Output this shift: No intake/output data recorded.  PE: Gen:  Alert, NAD, pleasant Abd: Soft, ND, NT, +BS  Lab Results:  Recent Labs    12/17/23 1720 12/18/23 0314  WBC 6.3 6.3  HGB 9.6* 8.9*  HCT 32.6* 29.9*  PLT 440* 404*   BMET Recent Labs    12/17/23 0500 12/17/23 0910 12/17/23 0915 12/18/23 0314  NA 135   < > 138 139  K 4.1   < > 3.9 4.4  CL 105  --   --  107  CO2 22  --   --  23  GLUCOSE 106*  --   --  101*  BUN 10  --   --  9  CREATININE 1.28*  --   --  1.07*  CALCIUM 8.9  --   --  9.1   < > = values in this interval not displayed.   PT/INR Recent Labs    12/17/23 1721  LABPROT 14.0  INR 1.1   CMP     Component Value Date/Time   NA 139 12/18/2023 0314   NA 140 09/29/2019 1449   K 4.4 12/18/2023 0314   CL 107 12/18/2023 0314   CO2 23 12/18/2023 0314   GLUCOSE 101 (H) 12/18/2023 0314   BUN 9 12/18/2023 0314   BUN 17 09/29/2019 1449   CREATININE 1.07 (H) 12/18/2023 0314   CALCIUM 9.1 12/18/2023 0314   PROT 6.8 12/12/2023 2052   PROT 7.6 09/29/2019 1449   ALBUMIN 2.8 (L) 12/12/2023 2052   ALBUMIN 4.6 09/29/2019 1449   AST 21 12/12/2023 2052   ALT 11 12/12/2023 2052   ALKPHOS 82 12/12/2023 2052   BILITOT 0.5 12/12/2023 2052   BILITOT 0.3 09/29/2019 1449   GFRNONAA 58 (L) 12/18/2023  0314   GFRAA 70 09/29/2019 1449   Lipase  No results found for: "LIPASE"  Studies/Results: CARDIAC CATHETERIZATION Result Date: 12/17/2023 Conclusions: No angiographically significant coronary artery disease.  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 Normal left heart filling pressures. Mildly elevated right heart and pulmonary artery pressures. Normal Fick cardiac output/index. Recommendations: Maintain net even fluid balance. Escalate goal-directed medical therapy for management of non-ischemic cardiomyopathy. Sammy Crisp, MD [facility]  ECHOCARDIOGRAM COMPLETE Result Date: 12/16/2023    ECHOCARDIOGRAM REPORT   Patient Name:   Leah Hernandez Date of Exam: 12/16/2023 Medical Rec #:  [id number]         Height:       67.0 in Accession #:    [id number]        Weight:       235.7 lb Date of Birth:  1960/12/18         BSA:          2.168 m Patient Age:    63 years  BP:           111/63 mmHg Patient Gender: F                 HR:           97 bpm. Exam Location:  Inpatient Procedure: 2D Echo, 3D Echo, Cardiac Doppler, Color Doppler and Strain Analysis            (Both Spectral and Color Flow Doppler were utilized during            procedure). Indications:    NSVT (nonsustained ventricular tachycardia)  History:        Patient has no prior history of Echocardiogram examinations.                 Risk Factors:Prediabetes.  Sonographer:    Travis Friedman RDCS Referring Phys: 19 DAWOOD S ELGERGAWY IMPRESSIONS  1. Left ventricular ejection fraction, by estimation, is 20 to 25%. Left ventricular ejection fraction by 3D volume is 38 %. The left ventricle has severely decreased function. The left ventricle demonstrates global hypokinesis. The left ventricular internal cavity size was moderately to severely dilated. There is severe asymmetric left ventricular hypertrophy of the lateral segment. Left ventricular diastolic parameters are consistent with Grade I diastolic  dysfunction (impaired relaxation). The average left ventricular global longitudinal strain is -9.4 %. The global longitudinal strain is abnormal.  2. Right ventricular systolic function is normal. The right ventricular size is normal. There is normal pulmonary artery systolic pressure.  3. The mitral valve is normal in structure. No evidence of mitral valve regurgitation. No evidence of mitral stenosis.  4. The aortic valve is tricuspid. Aortic valve regurgitation is not visualized. No aortic stenosis is present.  5. The inferior vena cava is normal in size with greater than 50% respiratory variability, suggesting right atrial pressure of 3 mmHg. Comparison(s): No prior Echocardiogram. FINDINGS  Left Ventricle: Left ventricular ejection fraction, by estimation, is 20 to 25%. Left ventricular ejection fraction by 3D volume is 38 %. The left ventricle has severely decreased function. The left ventricle demonstrates global hypokinesis. The average  left ventricular global longitudinal strain is -9.4 %. Strain was performed and the global longitudinal strain is abnormal. The left ventricular internal cavity size was moderately to severely dilated. There is severe asymmetric left ventricular hypertrophy of the lateral segment. Left ventricular diastolic parameters are consistent with Grade I diastolic dysfunction (impaired relaxation). Right Ventricle: The right ventricular size is normal. No increase in right ventricular wall thickness. Right ventricular systolic function is normal. There is normal pulmonary artery systolic pressure. The tricuspid regurgitant velocity is 2.48 m/s, and  with an assumed right atrial pressure of 3 mmHg, the estimated right ventricular systolic pressure is 27.6 mmHg. Left Atrium: Left atrial size was normal in size. Right Atrium: Right atrial size was normal in size. Pericardium: There is no evidence of pericardial effusion. Mitral Valve: The mitral valve is normal in structure. No evidence  of mitral valve regurgitation. No evidence of mitral valve stenosis. Tricuspid Valve: The tricuspid valve is normal in structure. Tricuspid valve regurgitation is mild . No evidence of tricuspid stenosis. Aortic Valve: The aortic valve is tricuspid. Aortic valve regurgitation is not visualized. No aortic stenosis is present. Aortic valve mean gradient measures 3.3 mmHg. Aortic valve peak gradient measures 6.5 mmHg. Aortic valve area, by VTI measures 2.97 cm. Pulmonic Valve: The pulmonic valve was not well visualized. Pulmonic valve regurgitation is not visualized. No evidence of pulmonic stenosis.  Aorta: The aortic root is normal in size and structure. Venous: The inferior vena cava is normal in size with greater than 50% respiratory variability, suggesting right atrial pressure of 3 mmHg. IAS/Shunts: No atrial level shunt detected by color flow Doppler. Additional Comments: 3D was performed not requiring image post processing on an independent workstation and was indeterminate.  LEFT VENTRICLE PLAX 2D LVIDd:         6.20 cm         Diastology LVIDs:         5.10 cm         LV e' medial:    8.92 cm/s LV PW:         1.50 cm         LV E/e' medial:  11.8 LV IVS:        0.70 cm         LV e' lateral:   8.38 cm/s LVOT diam:     2.50 cm         LV E/e' lateral: 12.5 LV SV:         68 LV SV Index:   31              2D Longitudinal LVOT Area:     4.91 cm        Strain                                2D Strain GLS   -9.4 %                                Avg:                                 3D Volume EF                                LV 3D EF:    Left                                             ventricul                                             ar                                             ejection                                             fraction                                             by 3D  volume is                                             38 %.                                  3D Volume EF:                                3D EF:        38 %                                LV EDV:       157 ml                                LV ESV:       98 ml                                LV SV:        59 ml RIGHT VENTRICLE             IVC RV Basal diam:  3.20 cm     IVC diam: 1.70 cm RV S prime:     13.50 cm/s TAPSE (M-mode): 2.2 cm LEFT ATRIUM           Index        RIGHT ATRIUM           Index LA diam:      4.50 cm 2.08 cm/m   RA Area:     16.90 cm LA Vol (A2C): 48.8 ml 22.51 ml/m  RA Volume:   40.60 ml  18.72 ml/m LA Vol (A4C): 73.5 ml 33.90 ml/m  AORTIC VALVE AV Area (Vmax):    2.98 cm AV Area (Vmean):   3.01 cm AV Area (VTI):     2.97 cm AV Vmax:           127.33 cm/s AV Vmean:          86.533 cm/s AV VTI:            0.228 m AV Peak Grad:      6.5 mmHg AV Mean Grad:      3.3 mmHg LVOT Vmax:         77.20 cm/s LVOT Vmean:        53.133 cm/s LVOT VTI:          0.138 m LVOT/AV VTI ratio: 0.61  AORTA Ao Root diam: 3.60 cm Ao Asc diam:  3.10 cm MITRAL VALVE                  TRICUSPID VALVE MV Area (PHT): 5.13 cm       TR Peak grad:   24.6 mmHg MV Decel Time: 148 msec       TR Vmax:        248.00 cm/s MR Peak grad:    87.6 mmHg MR Mean grad:    60.0 mmHg    SHUNTS MR Vmax:  468.00 cm/s  Systemic VTI:  0.14 m MR Vmean:        362.0 cm/s   Systemic Diam: 2.50 cm MR PISA:         2.26 cm MR PISA Eff ROA: 19 mm MR PISA Radius:  0.60 cm MV E velocity: 105.00 cm/s MV A velocity: 125.00 cm/s MV E/A ratio:  0.84 Vishnu Priya Mallipeddi Electronically signed by Lucetta Russel Mallipeddi Signature Date/Time: 12/16/2023/1:38:39 PM    Final     Anti-infectives: Anti-infectives (From admission, onward)    Start     Dose/Rate Route Frequency Ordered Stop   12/18/23 0815  cefoTEtan  (CEFOTAN ) 2 g in sodium chloride  0.9 % 100 mL IVPB        2 g 200 mL/hr over 30 Minutes Intravenous On call to O.R. 12/18/23 1610 12/19/23 0559   12/18/23 0600  cefoTEtan  (CEFOTAN ) 2 g in sodium chloride   0.9 % 100 mL IVPB  Status:  Discontinued        2 g 200 mL/hr over 30 Minutes Intravenous On call to O.R. 12/17/23 1624 12/18/23 0810   12/17/23 2200  neomycin  (MYCIFRADIN ) tablet 1,000 mg       Placed in "And" Linked Group   1,000 mg Oral 3 times per day 12/17/23 1624 12/18/23 2159   12/17/23 2200  metroNIDAZOLE  (FLAGYL ) tablet 1,000 mg       Placed in "And" Linked Group   1,000 mg Oral 3 times per day 12/17/23 1624 12/18/23 2159   12/17/23 0600  cefoTEtan  (CEFOTAN ) 2 g in sodium chloride  0.9 % 100 mL IVPB  Status:  Discontinued        2 g 200 mL/hr over 30 Minutes Intravenous On call to O.R. 12/16/23 1421 12/17/23 1427   12/16/23 1515  neomycin  (MYCIFRADIN ) tablet 1,000 mg  Status:  Discontinued       Placed in "And" Linked Group   1,000 mg Oral 3 times per day 12/16/23 1421 12/17/23 1427   12/16/23 1515  metroNIDAZOLE  (FLAGYL ) tablet 1,000 mg  Status:  Discontinued       Placed in "And" Linked Group   1,000 mg Oral 3 times per day 12/16/23 1421 12/17/23 1427        Assessment/Plan Transverse colon cancer ABL/IDA anemia  - Recommend laparoscopic extended R hemicolectomy. The planned procedure and material risks were discussed with the patient. Risks include but are not limited to anesthesia (MI, CVA, prolonged intubation, aspiration, death), pain, bleeding, infection, scarring, hernia, damage to surrounding structures (blood vessels/nerves/viscus/organs/ureter), ileus, leak from anastomosis, post-operative abscess, DVT/PE and possible need for stoma/ileostomy. We also discussed typical post-operative care including the possible need for rehab if necessary. The patient's questions were answered to their satisfaction, they voiced understanding and elected to proceed with surgery.    Cannon Champion, MD [facility] Surgery 12/18/2023, 8:31 AM Please see Amion for pager number during day hours 7:00am-4:30pm

## 2023-12-18 NOTE — Progress Notes (Signed)
 Upon performing chart check, a sign and held order for ferumoxytol on 12/17/23 at 1045 was found. I consulted the on call provider as to how to proceed with the order. Upon further review by on call provider, the ferumoxytol was continued to be held.

## 2023-12-18 NOTE — Anesthesia Preprocedure Evaluation (Addendum)
 Anesthesia Evaluation  Patient identified by MRN, date of birth, ID band Patient awake    Reviewed: Allergy & Precautions, NPO status , Patient's Chart, lab work & pertinent test results  History of Anesthesia Complications Negative for: history of anesthetic complications  Airway Mallampati: III  TM Distance: >3 FB Neck ROM: Full    Dental  (+) Dental Advisory Given   Pulmonary neg shortness of breath, neg sleep apnea, neg COPD, neg recent URI   breath sounds clear to auscultation       Cardiovascular (-) angina +CHF   Rhythm:Regular Rate:Tachycardia  1. Left ventricular ejection fraction, by estimation, is 20 to 25%. Left  ventricular ejection fraction by 3D volume is 38 %. The left ventricle has  severely decreased function. The left ventricle demonstrates global  hypokinesis. The left ventricular  internal cavity size was moderately to severely dilated. There is severe  asymmetric left ventricular hypertrophy of the lateral segment. Left  ventricular diastolic parameters are consistent with Grade I diastolic  dysfunction (impaired relaxation). The  average left ventricular global longitudinal strain is -9.4 %. The global  longitudinal strain is abnormal.   2. Right ventricular systolic function is normal. The right ventricular  size is normal. There is normal pulmonary artery systolic pressure.   3. The mitral valve is normal in structure. No evidence of mitral valve  regurgitation. No evidence of mitral stenosis.   4. The aortic valve is tricuspid. Aortic valve regurgitation is not  visualized. No aortic stenosis is present.   5. The inferior vena cava is normal in size with greater than 50%  respiratory variability, suggesting right atrial pressure of 3 mmHg.     Neuro/Psych negative neurological ROS  negative psych ROS   GI/Hepatic Neg liver ROS,,, COLON CANCER   Endo/Other  negative endocrine ROS  Lab Results       Component                Value               Date                      HGBA1C                   5.2                 12/17/2023             Renal/GU Lab Results      Component                Value               Date                      NA                       139                 12/18/2023                K                        4.4                 12/18/2023                CO2  23                  12/18/2023                GLUCOSE                  101 (H)             12/18/2023                BUN                      9                   12/18/2023                CREATININE               1.07 (H)            12/18/2023                CALCIUM                  9.1                 12/18/2023                GFRNONAA                 58 (L)              12/18/2023                Musculoskeletal   Abdominal   Peds  Hematology  (+) Blood dyscrasia, anemia Lab Results      Component                Value               Date                      WBC                      6.3                 12/18/2023                HGB                      8.9 (L)             12/18/2023                HCT                      29.9 (L)            12/18/2023                MCV                      72.4 (L)            12/18/2023                PLT                      404 (H)  12/18/2023              Anesthesia Other Findings   Reproductive/Obstetrics                              Anesthesia Physical Anesthesia Plan  ASA: 3  Anesthesia Plan: General   Post-op Pain Management:    Induction: Intravenous  PONV Risk Score and Plan: 4 or greater and Ondansetron , Dexamethasone, TIVA, Propofol  infusion and Midazolam   Airway Management Planned: Oral ETT  Additional Equipment: Arterial line  Intra-op Plan:   Post-operative Plan: Extubation in OR  Informed Consent: I have reviewed the patients History and Physical, chart, labs and discussed the  procedure including the risks, benefits and alternatives for the proposed anesthesia with the patient or authorized representative who has indicated his/her understanding and acceptance.     Dental advisory given  Plan Discussed with: CRNA  Anesthesia Plan Comments:          Anesthesia Quick Evaluation

## 2023-12-18 NOTE — Anesthesia Procedure Notes (Signed)
 Procedure Name: Intubation Date/Time: 12/18/2023 9:33 AM  Performed by: Pasty Bongo, CRNAPre-anesthesia Checklist: Patient identified, Emergency Drugs available, Suction available and Patient being monitored Patient Re-evaluated:Patient Re-evaluated prior to induction Oxygen Delivery Method: Circle system utilized Preoxygenation: Pre-oxygenation with 100% oxygen Induction Type: IV induction Ventilation: Mask ventilation without difficulty Laryngoscope Size: Miller and 2 Grade View: Grade I Tube type: Oral Tube size: 7.0 mm Number of attempts: 1 Airway Equipment and Method: Stylet and Oral airway Placement Confirmation: ETT inserted through vocal cords under direct vision, positive ETCO2 and breath sounds checked- equal and bilateral Secured at: 21 cm Tube secured with: Tape Dental Injury: Teeth and Oropharynx as per pre-operative assessment

## 2023-12-18 NOTE — Assessment & Plan Note (Signed)
 Renal function with serum cr at 1,0 with K at 4,4 and serum bicarbonate at 23  Na 139   Plan to continue close monitoring renal function and electrolytes.  Holding on diuretic therapy for now.

## 2023-12-18 NOTE — Anesthesia Procedure Notes (Addendum)
 Arterial Line Insertion Start/End5/20/2025 9:10 AM, 12/18/2023 9:15 AM Performed by: Arvie Latus, MD  Patient location: Pre-op. Preanesthetic checklist: patient identified, IV checked, site marked, risks and benefits discussed, surgical consent, monitors and equipment checked, pre-op evaluation, timeout performed and anesthesia consent Lidocaine  1% used for infiltration Right, radial was placed Catheter size: 20 G Hand hygiene performed   Attempts: 2 Procedure performed using ultrasound guided technique. Ultrasound Notes:anatomy identified, needle tip was noted to be adjacent to the nerve/plexus identified and no ultrasound evidence of intravascular and/or intraneural injection Following insertion, dressing applied and Biopatch. Post procedure assessment: normal  Post procedure complications: unsuccessful attempts. Patient tolerated the procedure well with no immediate complications. Additional procedure comments: Placed by SRNA. 1st attempt utilizing landmarks was unsuccessful. Transition to US -guided approach yielded a successful 2nd attempt. Aaron Aas

## 2023-12-18 NOTE — Assessment & Plan Note (Addendum)
 Echocardiogram with reduced LV systolic function with EF 20 to 25% with global hypokinesis, moderate to severe dilation of LV cavity, severe LVH of the lateral segment, RV systolic function preserved, no significant valvular disease.   05/19 cardiac cardiac catheterization  RA 7  RV 35/8 mmHg  PA 34/16 mean 22  PCWP 13  Cardiac output 7.9 index 3.6   Systolic blood pressure in the 100 mmHg range  Continue with metoprolol .  Will start with guideline directed medical therapy in the next 24 hrs.   Non sustained VT. Continue telemetry monitoring keep K at 4 and Mg at 2/.

## 2023-12-18 NOTE — Op Note (Signed)
 PATIENT: Leah Hernandez  63 y.o. female  Patient Care Team: Collins Dean, NP as PCP - General (Nurse Practitioner) Sheryle Donning, MD as PCP - Cardiology (Cardiology)  PREOP DIAGNOSIS: Transverse colon cancer  POSTOP DIAGNOSIS: Same  PROCEDURE:  Laparoscopic assisted extended right hemicolectomy  Bilateral transversus abdominus plane blocks  SURGEON: Armond Bertin, MD  ANESTHESIA: General endotracheal  EBL: Total I/O In: 1915 [I.V.:1100; Blood:315; IV Piggyback:500] Out: 490 [Urine:40; Blood:450]  DRAINS: None  SPECIMEN: Extended right colectomy  COUNTS: Sponge, needle and instrument counts were reported correct x2  FINDINGS:  No obvious metastatic disease on visceral parietal peritoneum or liver.  STATEMENT OF MEDICAL NECESSITY: [name] is a 63 y.o. female who presented after a syncopal episode. She was found to be anemic and workup revealed a transverse colon mass that was biopsied and proven to be malignant. She had no evidence of distant disease. I offered her a partial colectomy. All of her questions were addressed and written consent was obtained.   NARRATIVE:  The patient was identified & brought into the operating room, placed supine on the operating table and SCDs were applied to the lower extremities. General endotracheal anesthesia was induced. The patient was positioned supine with arms tucked. Antibiotics were administered. A foley catheter was placed under sterile conditions. Hair in the region of planned surgery was clipped. The abdomen was prepped and draped in a sterile fashion. A timeout was performed confirming our patient and plan.   Beginning with the extraction port, a supraumbilical incision was made and carried down to the midline fascia. This was then incised with electrocautery. The peritoneum was identified and elevated between clamps and carefully opened sharply. A small Alexis wound protector with a cap and  associated port was then placed. The abdomen was insufflated to 15 mmHg with Co2. A laparoscope was placed and camera inspection revealed no evidence of injury. Bilateral TAP blocks were then performed under laparoscopic visualization using 0.25% marcaine  with epinepherine  Additional ports were then placed under direct laparoscopic visualization - two in the left hemiabdomen. The abdomen was surveyed. The liver and peritoneum appeared normal.  There were some adhesions between the liver and the abdominal wall but no obvious masses or evidence of metastatic disease.  The patient was positioned in trendelenburg with left side down. The appendix was identified and medialized. I incised the white line and began a lateral mobilization of the colon, moving toward the hepatic flexure. I took care to avoid injuring any retroperitoneal structures.  I then repositioned the patient in reverse trendelenburg. I identified the ink markings on the colon within the proximal transverse colon. I mobilized the omentum off of the right transverse colon. The entire colon was then flipped medially and mobilized off of the retroperitoneal structures until I could visualize the lateral edge of the duodenum underneath.  I gently freed the duodenal attachments.   At this point, the abdomen was desufflated and the terminal ileum and right colon delivered through the wound protector. The mass within the transverse colon was quite large, necessitating that I extend the incision. In trying to mobilize the specimen through the opening there was tearing of 2 mesenteric veins, which led to approximately of blood loss. The vessels were clamped and later included in the specimen. The terminal ileum was then transected using a GIA blue load stapler. The remaining mesentery was divided using the Enseal device. The divided mesentery was inspected and noted to be hemostatic. The distal point  of transection was then identified on the transverse  colon approximately 5cm distal to the marked area. This was transected using another blue load GIA stapler.  The specimen was then passed off. Attention was turned to creating the anastomosis. The terminal ileum and transverse colon were inspected for orientation to ensure no twisting nor bowel included in the mesenteric defect. An anastomosis was created between the terminal ileum and the transverse colon using a 75 mm GIA blue load stapler. The staple line was inspected and noted to be hemostatic. The anastomosis was palpated and noted to be widely patent. This was then placed back into the abdomen. The abdomen was then irrigated with sterile saline and hemostasis verified. The omentum was then brought down over the anastomosis. The wound protector cap was replaced and Co2 reinsufflated. The laparoscopic ports were removed under direct visualization and the sites noted to be hemostatic. The Alexis wound protector was removed, counts were reported correct, and we switched to clean instruments, gowns and drapes. The fascia was then closed using two running #1 PDS sutures.  The skin of all incision sites was closed with 4-0 monocryl subcuticular suture. Dermabond was placed on all the incisions. All counts were reported correct. The patient was then awakened from anesthesia and sent to the post anesthesia care unit in stable condition.   DISPOSITION: PACU in satisfactory condition

## 2023-12-18 NOTE — Assessment & Plan Note (Signed)
 Symptomatic anemia.  Hgb today is 8,9 Continue close follow up cell count, keep hgb 8 or greater.

## 2023-12-18 NOTE — Progress Notes (Signed)
 [facility] night cross cover note:   I was notified by the patient's RN that an order for ferumoxytol is currently being held. Per my brief chart review, including review of Hospitalist documentation, it does not appear that current plan includes pursuit of ferumoxytol . I subsequently conveyed to the patient's RN that it is okay to continue to hold the ferumoxytol .      Camelia Cavalier, DO Hospitalist

## 2023-12-18 NOTE — Assessment & Plan Note (Signed)
 Sp resection today.  Continue pos op care per surgery recommendations

## 2023-12-18 NOTE — Progress Notes (Signed)
 Progress Note   Patient: Leah Hernandez WJX:[id number] DOB: March 11, 1961 DOA: 12/12/2023     6 DOS: the patient was seen and examined on 12/18/2023   Brief hospital course: Mrs. Ueda was admitted to the hospital with the working diagnosis of symptomatic anemia.    63 y.o. female with no significant documented past medical history who presented to urgent care after a near syncope episode during grocery store.  Patient stated that she was very lightheaded and has been feeling weak for the last 4 to 5 months with shortness of breath on exertion. In the ED patient was afebrile but slightly tachycardic.   Initial labs showed hemoglobin of 5.1.   Urinalysis showed some rare bacteria with WBC 21-50.  BMP was notable for sodium low at 132 and creatinine at 1.0.  Iron  profile showed ferritin low at 3 with iron  of 10.  Vitamin B12 was 250 and TSH 3.8.  Fecal occult blood was negative.  Type and screen was done and patient received PRBC.  She went for endoscopy, workup significant for colon mass s/p biopsy, as well noted to have NSVT's, workup significant for low EF 20 to 25%  Patient was optimized from cardiovascular perspective.   05/20 Laparoscopic assisted extended right hemicolectomy     Assessment and Plan: * Iron  deficiency anemia due to colon cancer Symptomatic anemia.  Hgb today is 8,9 Continue close follow up cell count, keep hgb 8 or greater.   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  Continue pos op care per surgery recommendations   Acute systolic CHF (congestive heart failure) (HCC) Echocardiogram with reduced LV systolic function with EF 20 to 25% with global hypokinesis, moderate to severe dilation of LV cavity, severe LVH of the lateral segment, RV systolic function preserved, no significant valvular disease.   05/19 cardiac cardiac catheterization  RA 7  RV 35/8 mmHg  PA 34/16 mean 22  PCWP 13  Cardiac output 7.9 index 3.6   Systolic blood  pressure in the 100 mmHg range  Continue with metoprolol .  Will start with guideline directed medical therapy in the next 24 hrs.   Non sustained VT. Continue telemetry monitoring keep K at 4 and Mg at 2/.   AKI (acute kidney injury) (HCC) Renal function with serum cr at 1,0 with K at 4,4 and serum bicarbonate at 23  Na 139   Plan to continue close monitoring renal function and electrolytes.  Holding on diuretic therapy for now.   Prediabetes Fasting glucose this am 101 mg.dl   Class 2 obesity with body mass index (BMI) of 35 to 39.9 without comorbidity Calculated BMI is 31.7       Subjective: patient pos op doing well, no significant abdominal pain, no dyspnea, no chest pain   Physical Exam: Vitals:   12/18/23 1400 12/18/23 1416 12/18/23 1530 12/18/23 1630  BP: 100/66 103/66 107/69 104/71  Pulse: 82     Resp: 17 19 15 16   Temp: (!) 97.3 F (36.3 C) 97.7 F (36.5 C)    TempSrc:      SpO2: 97%  97%   Weight:      Height:       Neurology somnolent post op, easy to arouse, responds to questions and follows commands ENT with mild pallor Cardiovascular with S1 and S2 present and regular with no gallops, rubs or murmurs Respiratory with no rales or wheezing, no rhonchi  Abdomen with no distention  No lower extremity edema  Data Reviewed:    Family Communication: no family at the bedside   Disposition: Status is: Inpatient Remains inpatient appropriate because: post op care   Planned Discharge Destination: Home     Author: Albertus Alt, MD 12/18/2023 5:24 PM  For on call review [email or web address].

## 2023-12-18 NOTE — Assessment & Plan Note (Signed)
Calculated BMI is 31.7

## 2023-12-18 NOTE — Plan of Care (Signed)
  Problem: Education: Goal: Knowledge of General Education information will improve Description: Including pain rating scale, medication(s)/side effects and non-pharmacologic comfort measures Outcome: Progressing   Problem: Health Behavior/Discharge Planning: Goal: Ability to manage health-related needs will improve Outcome: Progressing   Problem: Clinical Measurements: Goal: Ability to maintain clinical measurements within normal limits will improve Outcome: Progressing Goal: Will remain free from infection Outcome: Progressing Goal: Diagnostic test results will improve Outcome: Progressing Goal: Respiratory complications will improve Outcome: Progressing Goal: Cardiovascular complication will be avoided Outcome: Progressing   Problem: Activity: Goal: Risk for activity intolerance will decrease Outcome: Progressing   Problem: Nutrition: Goal: Adequate nutrition will be maintained Outcome: Progressing   Problem: Coping: Goal: Level of anxiety will decrease Outcome: Progressing   Problem: Elimination: Goal: Will not experience complications related to bowel motility Outcome: Progressing Goal: Will not experience complications related to urinary retention Outcome: Progressing   Problem: Pain Managment: Goal: General experience of comfort will improve and/or be controlled Outcome: Progressing   Problem: Safety: Goal: Ability to remain free from injury will improve Outcome: Progressing   Problem: Skin Integrity: Goal: Risk for impaired skin integrity will decrease Outcome: Progressing   Problem: Education: Goal: Understanding of discharge needs will improve Outcome: Progressing Goal: Verbalization of understanding of the causes of altered bowel function will improve Outcome: Progressing   Problem: Activity: Goal: Ability to tolerate increased activity will improve Outcome: Progressing   Problem: Bowel/Gastric: Goal: Gastrointestinal status for postoperative  course will improve Outcome: Progressing   Problem: Health Behavior/Discharge Planning: Goal: Identification of community resources to assist with postoperative recovery needs will improve Outcome: Progressing   Problem: Nutritional: Goal: Will attain and maintain optimal nutritional status will improve Outcome: Progressing   Problem: Clinical Measurements: Goal: Postoperative complications will be avoided or minimized Outcome: Progressing   Problem: Respiratory: Goal: Respiratory status will improve Outcome: Progressing   Problem: Skin Integrity: Goal: Will show signs of wound healing Outcome: Progressing   Problem: Education: Goal: Understanding of CV disease, CV risk reduction, and recovery process will improve Outcome: Progressing Goal: Individualized Educational Video(s) Outcome: Progressing   Problem: Activity: Goal: Ability to return to baseline activity level will improve Outcome: Progressing   Problem: Cardiovascular: Goal: Ability to achieve and maintain adequate cardiovascular perfusion will improve Outcome: Progressing Goal: Vascular access site(s) Level 0-1 will be maintained Outcome: Progressing   Problem: Health Behavior/Discharge Planning: Goal: Ability to safely manage health-related needs after discharge will improve Outcome: Progressing

## 2023-12-18 NOTE — Transfer of Care (Signed)
 Immediate Anesthesia Transfer of Care Note  Patient: Leah Hernandez  Procedure(s) Performed: LAPAROSCOPIC assisted  COLECTOMY (Right)  Patient Location: PACU  Anesthesia Type:General  Level of Consciousness: drowsy  Airway & Oxygen Therapy: Patient Spontanous Breathing and Patient connected to face mask oxygen  Post-op Assessment: Report given to RN and Post -op Vital signs reviewed and stable  Post vital signs: Reviewed and stable  Last Vitals:  Vitals Value Taken Time  BP 94/55 12/18/23 1306  Temp    Pulse 84 12/18/23 1310  Resp 27 12/18/23 1310  SpO2 100 % 12/18/23 1310  Vitals shown include unfiled device data.  Last Pain:  Vitals:   12/18/23 0816  TempSrc:   PainSc: 0-No pain      Patients Stated Pain Goal: 0 (12/14/23 1600)  Complications: No notable events documented.

## 2023-12-19 ENCOUNTER — Encounter (HOSPITAL_COMMUNITY): Payer: Self-pay | Admitting: General Surgery

## 2023-12-19 DIAGNOSIS — D509 Iron deficiency anemia, unspecified: Secondary | ICD-10-CM | POA: Diagnosis not present

## 2023-12-19 DIAGNOSIS — I5021 Acute systolic (congestive) heart failure: Secondary | ICD-10-CM | POA: Diagnosis not present

## 2023-12-19 DIAGNOSIS — D649 Anemia, unspecified: Secondary | ICD-10-CM | POA: Diagnosis not present

## 2023-12-19 DIAGNOSIS — I428 Other cardiomyopathies: Secondary | ICD-10-CM | POA: Diagnosis not present

## 2023-12-19 LAB — RETICULOCYTES
Immature Retic Fract: 19.8 % — ABNORMAL HIGH (ref 2.3–15.9)
RBC.: 3.46 MIL/uL — ABNORMAL LOW (ref 3.87–5.11)
Retic Count, Absolute: 40 10*3/uL (ref 19.0–186.0)
Retic Ct Pct: 1.1 % (ref 0.4–3.1)

## 2023-12-19 LAB — CBC
HCT: 26.7 % — ABNORMAL LOW (ref 36.0–46.0)
Hemoglobin: 8.2 g/dL — ABNORMAL LOW (ref 12.0–15.0)
MCH: 22.7 pg — ABNORMAL LOW (ref 26.0–34.0)
MCHC: 30.7 g/dL (ref 30.0–36.0)
MCV: 74 fL — ABNORMAL LOW (ref 80.0–100.0)
Platelets: 331 10*3/uL (ref 150–400)
RBC: 3.61 MIL/uL — ABNORMAL LOW (ref 3.87–5.11)
RDW: 27.9 % — ABNORMAL HIGH (ref 11.5–15.5)
WBC: 11.4 10*3/uL — ABNORMAL HIGH (ref 4.0–10.5)
nRBC: 0 % (ref 0.0–0.2)

## 2023-12-19 LAB — BASIC METABOLIC PANEL WITH GFR
Anion gap: 7 (ref 5–15)
BUN: 9 mg/dL (ref 8–23)
CO2: 22 mmol/L (ref 22–32)
Calcium: 8.6 mg/dL — ABNORMAL LOW (ref 8.9–10.3)
Chloride: 109 mmol/L (ref 98–111)
Creatinine, Ser: 1.08 mg/dL — ABNORMAL HIGH (ref 0.44–1.00)
GFR, Estimated: 58 mL/min — ABNORMAL LOW (ref >60)
Glucose, Bld: 123 mg/dL — ABNORMAL HIGH (ref 70–99)
Potassium: 4.2 mmol/L (ref 3.5–5.1)
Sodium: 138 mmol/L (ref 135–145)

## 2023-12-19 LAB — FERRITIN: Ferritin: 101 ng/mL (ref 11–307)

## 2023-12-19 LAB — FOLATE: Folate: 9.1 ng/mL (ref >5.9)

## 2023-12-19 LAB — IRON AND TIBC
Iron: 13 ug/dL — ABNORMAL LOW (ref 28–170)
Saturation Ratios: 5 % — ABNORMAL LOW (ref 10.4–31.8)
TIBC: 259 ug/dL (ref 250–450)
UIBC: 246 ug/dL

## 2023-12-19 LAB — VITAMIN B12: Vitamin B-12: >7500 pg/mL — ABNORMAL HIGH (ref 180–914)

## 2023-12-19 MED ORDER — ACETAMINOPHEN 325 MG PO TABS
650.0000 mg | ORAL_TABLET | Freq: Four times a day (QID) | ORAL | Status: DC
  Administered 2023-12-19: 650 mg via ORAL
  Filled 2023-12-19: qty 2

## 2023-12-19 MED ORDER — METHOCARBAMOL 500 MG PO TABS
500.0000 mg | ORAL_TABLET | Freq: Four times a day (QID) | ORAL | Status: DC | PRN
  Filled 2023-12-19: qty 1

## 2023-12-19 MED ORDER — SODIUM CHLORIDE 0.9 % IV SOLN
200.0000 mg | INTRAVENOUS | Status: AC
  Administered 2023-12-19 – 2023-12-20 (×2): 200 mg via INTRAVENOUS
  Filled 2023-12-19 (×2): qty 10

## 2023-12-19 MED ORDER — ACETAMINOPHEN 500 MG PO TABS
1000.0000 mg | ORAL_TABLET | Freq: Four times a day (QID) | ORAL | Status: DC
  Administered 2023-12-19 – 2023-12-22 (×8): 1000 mg via ORAL
  Filled 2023-12-19 (×13): qty 2

## 2023-12-19 MED ORDER — METHOCARBAMOL 500 MG PO TABS
500.0000 mg | ORAL_TABLET | Freq: Four times a day (QID) | ORAL | Status: DC
  Administered 2023-12-19 – 2023-12-22 (×13): 500 mg via ORAL
  Filled 2023-12-19 (×12): qty 1

## 2023-12-19 MED ORDER — ADULT MULTIVITAMIN W/MINERALS CH
1.0000 | ORAL_TABLET | Freq: Every day | ORAL | Status: DC
  Administered 2023-12-19 – 2023-12-22 (×4): 1 via ORAL
  Filled 2023-12-19 (×4): qty 1

## 2023-12-19 MED ORDER — SPIRONOLACTONE 12.5 MG HALF TABLET
12.5000 mg | ORAL_TABLET | Freq: Every day | ORAL | Status: DC
  Administered 2023-12-19 – 2023-12-22 (×4): 12.5 mg via ORAL
  Filled 2023-12-19 (×4): qty 1

## 2023-12-19 MED ORDER — THIAMINE MONONITRATE 100 MG PO TABS
100.0000 mg | ORAL_TABLET | Freq: Every day | ORAL | Status: DC
  Administered 2023-12-19 – 2023-12-22 (×4): 100 mg via ORAL
  Filled 2023-12-19 (×4): qty 1

## 2023-12-19 MED ORDER — IRON SUCROSE 200 MG IVPB - SIMPLE MED
200.0000 mg | Status: DC
  Filled 2023-12-19: qty 110

## 2023-12-19 NOTE — Progress Notes (Signed)
 Rounding Note    Patient Name: Leah Hernandez Date of Encounter: 12/19/2023  [facility] Cardiologist: Sheryle Donning, MD   Subjective   Post op day 1 from her hemicolectomy. Pain well controlled. Doesn't feel short of breath.  Inpatient Medications    Scheduled Meds:  sodium chloride    Intravenous Once   acetaminophen   1,000 mg Oral Q6H   bupivacaine  liposome  266 mg Infiltration Once   cyanocobalamin   1,000 mcg Subcutaneous Daily   feeding supplement  237 mL Oral BID BM   methocarbamol  500 mg Oral QID   metoprolol  tartrate  25 mg Oral BID   multivitamin with minerals  1 tablet Oral Daily   pantoprazole   40 mg Oral BID   sodium chloride  flush  3 mL Intravenous Q12H   spironolactone  12.5 mg Oral Daily   thiamine  100 mg Oral Daily   Continuous Infusions:  iron  sucrose (VENOFER ) 200 mg in sodium chloride  0.9 % 100 mL IVPB 200 mg (12/19/23 1515)    PRN Meds: HYDROmorphone (DILAUDID) injection, melatonin, ondansetron  **OR** ondansetron  (ZOFRAN ) IV, oxyCODONE , sodium chloride  flush   Vital Signs    Vitals:   12/19/23 0447 12/19/23 0827 12/19/23 0830 12/19/23 1128  BP: 114/69 115/76 115/76 96/60  Pulse: (!) 105 (!) 103 (!) 104 99  Resp: 17  20 19   Temp: 97.9 F (36.6 C) 97.8 F (36.6 C) 97.8 F (36.6 C) 99 F (37.2 C)  TempSrc: Oral Oral Oral Oral  SpO2: 90%  92% 94%  Weight: 91.1 kg     Height:        Intake/Output Summary (Last 24 hours) at 12/19/2023 1518 Last data filed at 12/19/2023 1257 Gross per 24 hour  Intake 957 ml  Output 600 ml  Net 357 ml      12/19/2023    4:47 AM 12/18/2023    7:54 AM 12/18/2023    6:05 AM  Last 3 Weights  Weight (lbs) 200 lb 12.5 oz 196 lb 6.9 oz 196 lb 6.9 oz  Weight (kg) 91.074 kg 89.1 kg 89.1 kg      Telemetry    SR - Personally Reviewed  Physical Exam   GEN: No acute distress.   Neck: No JVD Cardiac: RRR, no murmurs, rubs, or gallops.  Respiratory: Clear to auscultation  bilaterally. MS: No edema; No deformity. Neuro:  Nonfocal  Psych: Normal affect   New pertinent results (labs, ECG, imaging, cardiac studies)    Echo, cath images personally reviewed.  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 RHC with normal CO/CI.  -Has been ordered for spironolactone--with no CAD, would prefer ARB first to see if we have any room to transition to entresto, but no BP room for this today -I/O and weights inaccurate. Admission weight listed as 106.9 kg on 5/14, but then weight 5.19 was 88.6 kg, which is more consistent. Weight today 91.1 kg, charted net positive 5L, though output has only really been charted in the last 24 hours.  NSVT -on metoprolol , consolidate to succinate prior to discharge (ok to leave tartrate perioperatively)  Anemia, severe, requiring transfusion Lower GI bleed Colon mass -concern for colon cancer as etiology -now sp hemicolectomy 12/18/23, awaiting pathology    Signed, Sheryle Donning, MD  12/19/2023, 3:18 PM

## 2023-12-19 NOTE — Progress Notes (Signed)
 Initial Nutrition Assessment  DOCUMENTATION CODES:   Obesity unspecified, Severe malnutrition in context of chronic illness  INTERVENTION:   Encourage PO intake Monitor for diet advancement MVI with minerals daily  100 mg Thiamine daily x 5 days Ensure Enlive po BID, each supplement provides 350 kcal and 20 grams of protein. Magic cup TID with meals, each supplement provides 290 kcal and 9 grams of protein  If patient downgraded to a CLD again, recommend starting nutrition support due to being on mostly CLD for 7 days now.   NUTRITION DIAGNOSIS:   Severe Malnutrition related to chronic illness as evidenced by energy intake < or equal to 75% for > or equal to 1 month, percent weight loss (30 lbs, 15% weight loss in 3 months).   GOAL:   Patient will meet greater than or equal to 90% of their needs   MONITOR:   PO intake, Supplement acceptance, Diet advancement, Labs  REASON FOR ASSESSMENT:   NPO/Clear Liquid Diet    ASSESSMENT:  63 y.o Female who presented with near syncope episode while grocery shopping. Upon further workup, found to have iron  deficiency anemia due to colon cancer and new cardiomyopathy.  History of prediabetes, but no other significant PMH.  05/19 s/p left and right cardiac cardiac catheterization  05/20 s/p Laparoscopic assisted extended right hemicolectomy   Pt resting, daughter and husband at bedside. Feels tired after surgery yesterday.  Pt reports she has had a decreased appetite for a couple months now due to some abdominal pain, bloating, and nausea after eating. She would often have to remind herself to eat and even then it would be something small like a salad. Endorses losing 30 lbs in the last couple of months because of having no desire to eat. Now weighs 200 lbs, was 235 lbs a couple months ago. Has had progressive fatigue as well.  Since admission pt has mostly been on a CLD with diet being advanced to full liquids or regular for a couple  hours and then downgraded back to clears. However it was noted she had 100% of her dinner when she was on a regular diet. Pt on CLD this morning and advanced to fulls this afternoon. Having a decreased appetite after surgery but, hopeful it will return in the next coming days. Pt willing to continue to drink Ensures and try magic cups. Having some nausea today. Was nervous to work with PT today.   On exam, no signs of muscle or fat wasting but has had poor PO intake since admission due to being on mostly on clear liquids. Awaiting on bowel function for diet advancement. Pt reports not having a bowel movement or passing flatus this morning.   Admit weight: 89.1 kg  Current weight: 91.1 kg    Average Meal Intake: 5/16: 100% x 2 meals, FLD   Intake/Output Summary (Last 24 hours) at 12/19/2023 1424 Last data filed at 12/19/2023 1257 Gross per 24 hour  Intake 957 ml  Output 600 ml  Net 357 ml   Nutritionally Relevant Medications: Scheduled Meds:  sodium chloride    Intravenous Once   acetaminophen   1,000 mg Oral Q6H   bupivacaine  liposome  266 mg Infiltration Once   cyanocobalamin   1,000 mcg Subcutaneous Daily   feeding supplement  237 mL Oral BID BM   methocarbamol  500 mg Oral QID   metoprolol  tartrate  25 mg Oral BID   multivitamin with minerals  1 tablet Oral Daily   pantoprazole   40 mg Oral  BID   sodium chloride  flush  3 mL Intravenous Q12H   spironolactone  12.5 mg Oral Daily   Labs Reviewed: Creatinine 1.08 Calcium 8.6 Albumin 2.8 GFR 58 Iron  10 Ferritin 3 CBG ranges from 101-123 mg/dL over the last 24 hours HgbA1c 5.2  NUTRITION - FOCUSED PHYSICAL EXAM:  Flowsheet Row Most Recent Value  Orbital Region No depletion  Upper Arm Region No depletion  Thoracic and Lumbar Region No depletion  Buccal Region No depletion  Temple Region No depletion  Clavicle Bone Region No depletion  Clavicle and Acromion Bone Region No depletion  Scapular Bone Region No depletion  Dorsal  Hand No depletion  Patellar Region No depletion  Anterior Thigh Region No depletion  Posterior Calf Region No depletion  Edema (RD Assessment) None  Hair Reviewed  Eyes Reviewed  Mouth Reviewed  Skin Reviewed  Nails Reviewed       Diet Order:   Diet Order             Diet full liquid Fluid consistency: Thin  Diet effective now                   EDUCATION NEEDS:   Education needs have been addressed  Skin:  Skin Assessment: Skin Integrity Issues: Skin Integrity Issues:: Incisions Incisions: Abdomen  Last BM:  Unknown  Height:   Ht Readings from Last 1 Encounters:  12/18/23 5\' 6"  (1.676 m)    Weight:   Wt Readings from Last 1 Encounters:  12/19/23 91.1 kg    Ideal Body Weight:  59.1 kg  BMI:  Body mass index is 32.41 kg/m.  Estimated Nutritional Needs:   Kcal:  2100-2300 kcal  Protein:  110-130 gm  Fluid:  >2L/day   Frederik Jansky, RD Registered Dietitian  See Amion for more information

## 2023-12-19 NOTE — Discharge Instructions (Signed)
 [facility]      [name], [place] [phone]  OPEN ABDOMINAL SURGERY: POST OP INSTRUCTIONS  Always review your discharge instruction sheet given to you by the facility where your surgery was performed.  IF YOU HAVE DISABILITY OR FAMILY LEAVE FORMS, YOU MUST BRING THEM TO THE OFFICE FOR PROCESSING.  PLEASE DO NOT GIVE THEM TO YOUR DOCTOR.  A prescription for pain medication may be given to you upon discharge.  Take your pain medication as prescribed, if needed.  If narcotic pain medicine is not needed, then you may take acetaminophen (Tylenol) or ibuprofen (Advil) as needed. Take your usually prescribed medications unless otherwise directed. If you need a refill on your pain medication, please contact your pharmacy. They will contact our office to request authorization.  Prescriptions will not be filled after 5pm or on week-ends. You should follow a light diet the first few days after arrival home, such as soup and crackers, pudding, etc.unless your doctor has advised otherwise. A high-fiber, low fat diet can be resumed as tolerated.   Be sure to include lots of fluids daily. Most patients will experience some swelling and bruising on the chest and neck area.  Ice packs will help.  Swelling and bruising can take several days to resolve Most patients will experience some swelling and bruising in the area of the incision. Ice pack will help. Swelling and bruising can take several days to resolve..  It is common to experience some constipation if taking pain medication after surgery.  Increasing fluid intake and taking a stool softener will usually help or prevent this problem from occurring.  A mild laxative (Milk of Magnesia or Miralax) should be taken according to package directions if there are no bowel movements after 48 hours.  You may have steri-strips (small skin tapes) in place directly over the incision.  These strips should be left on the skin for 7-10 days.  If your surgeon used skin  glue on the incision, you may shower in 24 hours.  The glue will flake off over the next 2-3 weeks.  Any sutures or staples will be removed at the office during your follow-up visit. You may find that a light gauze bandage over your incision may keep your staples from being rubbed or pulled. You may shower and replace the bandage daily. ACTIVITIES:  You may resume regular (light) daily activities beginning the next day--such as daily self-care, walking, climbing stairs--gradually increasing activities as tolerated.  You may have sexual intercourse when it is comfortable.  Refrain from any heavy lifting or straining until approved by your doctor. You may drive when you no longer are taking prescription pain medication, you can comfortably wear a seatbelt, and you can safely maneuver your car and apply brakes Return to Work: ___________________________________ Leah Hernandez should see your doctor in the office for a follow-up appointment approximately two weeks after your surgery.  Make sure that you call for this appointment within a day or two after you arrive home to insure a convenient appointment time. OTHER INSTRUCTIONS:  _____________________________________________________________ _____________________________________________________________  WHEN TO CALL YOUR DOCTOR: Fever over 101.0 Inability to urinate Nausea and/or vomiting Extreme swelling or bruising Continued bleeding from incision. Increased pain, redness, or drainage from the incision. Difficulty swallowing or breathing Muscle cramping or spasms. Numbness or tingling in hands or feet or around lips.  The clinic staff is available to answer your questions during regular business hours.  Please don't hesitate to call and ask to speak to one of  the nurses if you have concerns.  For further questions, please visit [email or web address]

## 2023-12-19 NOTE — Progress Notes (Signed)
 Mobility Specialist Progress Note:   12/19/23 1124  Mobility  Activity Ambulated with assistance in hallway  Level of Assistance Minimal assist, patient does 75% or more  Assistive Device None  Distance Ambulated (ft) 500 ft  Activity Response Tolerated well  Mobility Referral Yes  Mobility visit 1 Mobility  Mobility Specialist Start Time (ACUTE ONLY) 1124  Mobility Specialist Stop Time (ACUTE ONLY) 1137  Mobility Specialist Time Calculation (min) (ACUTE ONLY) 13 min   Pt agreeable to mobility session. Required minA to get EOB d/t pain/guarding. Only required close supervision during ambulation with minimal steadying assistance. HR 120s with ambulation. Back in bed with all needs met, family present.   Oneda Big Mobility Specialist Please contact via SecureChat or  Rehab office at [phone]

## 2023-12-19 NOTE — Progress Notes (Addendum)
 [facility] Progress Note  1 Day Post-Op  Subjective: CC:  Denies pain at rest. Denies flatus. Tolerating clears. Has not been OOB. Her daughter is on speakerphone and her husband is reportedly on the way to the hospital.   Objective: Vital signs in last 24 hours: Temp:  [97.3 F (36.3 C)-98 F (36.7 C)] 97.9 F (36.6 C) (05/21 0447) Pulse Rate:  [82-105] 105 (05/21 0447) Resp:  [14-24] 17 (05/21 0447) BP: (91-114)/(55-71) 114/69 (05/21 0447) SpO2:  [90 %-100 %] 90 % (05/21 0447) Weight:  [91.1 kg] 91.1 kg (05/21 0447) Last BM Date : 12/18/23  Intake/Output from previous day: 05/20 0701 - 05/21 0700 In: 2395 [P.O.:480; I.V.:1100; Blood:315; IV Piggyback:500] Out: 890 [Urine:440; Blood:450] Intake/Output this shift: No intake/output data recorded.  PE: Gen:  Alert, NAD, pleasant Card:  Regular rate and rhythm Pulm:  Normal effort ORA Abd: Soft, appropriately tender, incisions c/d/I with subcuticular sutures/dermabond and no cellulitis or drainage GU: ~ 100 mL clear yellow urine in bag  Skin: warm and dry, no rashes  Psych: A&Ox3   Lab Results:  Recent Labs    12/18/23 0314 12/19/23 0254  WBC 6.3 11.4*  HGB 8.9* 8.2*  HCT 29.9* 26.7*  PLT 404* 331   BMET Recent Labs    12/18/23 0314 12/19/23 0254  NA 139 138  K 4.4 4.2  CL 107 109  CO2 23 22  GLUCOSE 101* 123*  BUN 9 9  CREATININE 1.07* 1.08*  CALCIUM 9.1 8.6*   PT/INR Recent Labs    12/17/23 1721  LABPROT 14.0  INR 1.1   CMP     Component Value Date/Time   NA 138 12/19/2023 0254   NA 140 09/29/2019 1449   K 4.2 12/19/2023 0254   CL 109 12/19/2023 0254   CO2 22 12/19/2023 0254   GLUCOSE 123 (H) 12/19/2023 0254   BUN 9 12/19/2023 0254   BUN 17 09/29/2019 1449   CREATININE 1.08 (H) 12/19/2023 0254   CALCIUM 8.6 (L) 12/19/2023 0254   PROT 6.8 12/12/2023 2052   PROT 7.6 09/29/2019 1449   ALBUMIN 2.8 (L) 12/12/2023 2052   ALBUMIN 4.6 09/29/2019 1449   AST 21 12/12/2023 2052    ALT 11 12/12/2023 2052   ALKPHOS 82 12/12/2023 2052   BILITOT 0.5 12/12/2023 2052   BILITOT 0.3 09/29/2019 1449   GFRNONAA 58 (L) 12/19/2023 0254   GFRAA 70 09/29/2019 1449   Lipase  No results found for: "LIPASE"     Studies/Results: CARDIAC CATHETERIZATION Result Date: 12/17/2023 Conclusions: No angiographically significant coronary artery disease.  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 Normal left heart filling pressures. Mildly elevated right heart and pulmonary artery pressures. Normal Fick cardiac output/index. Recommendations: Maintain net even fluid balance. Escalate goal-directed medical therapy for management of non-ischemic cardiomyopathy. Sammy Crisp, MD [facility]   Anti-infectives: Anti-infectives (From admission, onward)    Start     Dose/Rate Route Frequency Ordered Stop   12/18/23 0815  cefoTEtan  (CEFOTAN ) 2 g in sodium chloride  0.9 % 100 mL IVPB        2 g 200 mL/hr over 30 Minutes Intravenous On call to O.R. 12/18/23 1914 12/18/23 0939   12/18/23 0600  cefoTEtan  (CEFOTAN ) 2 g in sodium chloride  0.9 % 100 mL IVPB  Status:  Discontinued        2 g 200 mL/hr over 30 Minutes Intravenous On call to O.R. 12/17/23 1624 12/18/23 0810   12/17/23  2200  neomycin  (MYCIFRADIN ) tablet 1,000 mg  Status:  Discontinued       Placed in "And" Linked Group   1,000 mg Oral 3 times per day 12/17/23 1624 12/18/23 1413   12/17/23 2200  metroNIDAZOLE  (FLAGYL ) tablet 1,000 mg  Status:  Discontinued       Placed in "And" Linked Group   1,000 mg Oral 3 times per day 12/17/23 1624 12/18/23 1413   12/17/23 0600  cefoTEtan  (CEFOTAN ) 2 g in sodium chloride  0.9 % 100 mL IVPB  Status:  Discontinued        2 g 200 mL/hr over 30 Minutes Intravenous On call to O.R. 12/16/23 1421 12/17/23 1427   12/16/23 1515  neomycin  (MYCIFRADIN ) tablet 1,000 mg  Status:  Discontinued       Placed in "And" Linked Group   1,000 mg Oral 3 times per day 12/16/23  1421 12/17/23 1427   12/16/23 1515  metroNIDAZOLE  (FLAGYL ) tablet 1,000 mg  Status:  Discontinued       Placed in "And" Linked Group   1,000 mg Oral 3 times per day 12/16/23 1421 12/17/23 1427        Assessment/Plan Transverse colon cancer POD#1 s/p laparoscopic assisted extended right hemicolectomy 5/20 Dr. Davonna Estes  - AFVSS, WBC 11 from 6, hgb 8.2 from 8.9  - multimodal pain control: tylenol  1000 mg q 6h, add robaxin 500 mg QID, oxycodone  5 mg q6h PRN, dilaudid for breakthrough - CEA on 5/17 was 27.6  - CT chest without evidence of pulmonary nodules/masses  - await final pathology, then will need oncology consult  - OOB today, IS  FEN: advance to fulls/ensure and await bowel function ID: perioperative cefotan  given VTE: SCD's, ok for chemical DVT ppx with lovenox  or SQH Foley: likely remove today, kidney function stable (Cr 1.08) but UOP marginal (500-550 cc) will discuss with primary team before pulling.  Dispo: [facility] service   ABL/IDA anemia - due to above Acute systolic heart failure w reduced EF Prediabetes AKI  Obesity    LOS: 7 days   I reviewed nursing notes, hospitalist notes, last 24 h vitals and pain scores, last 48 h intake and output, last 24 h labs and trends, and last 24 h imaging results.  This care required straight-forward level of medical decision making.   Michial Akin, PA-C [facility] Please see Amion for pager number during day hours 7:00am-4:30pm

## 2023-12-19 NOTE — Anesthesia Postprocedure Evaluation (Signed)
 Anesthesia Post Note  Patient: Leah Hernandez  Procedure(s) Performed: LAPAROSCOPIC assisted  COLECTOMY (Right)     Patient location during evaluation: PACU Anesthesia Type: General Level of consciousness: awake and alert Pain management: pain level controlled Vital Signs Assessment: post-procedure vital signs reviewed and stable Respiratory status: spontaneous breathing, nonlabored ventilation and respiratory function stable Cardiovascular status: blood pressure returned to baseline and stable Postop Assessment: no apparent nausea or vomiting Anesthetic complications: no   No notable events documented.               Manjot Beumer

## 2023-12-19 NOTE — Progress Notes (Addendum)
 PROGRESS NOTE    Leah Hernandez  OZH:[id number] DOB: 1960/10/28 DOA: 12/12/2023 PCP: Collins Dean, NP  63/F with no significant PMH presented to the ED after a syncopal episode, history of fatigue for few months and dyspnea on exertion Initial labs showed hemoglobin of 5.1.,  Hemoccult was negative, transfused PRBC  -> Colonoscopy significant for colon mass s/p biopsy, as well noted to have NSVT's, workup significant for low EF 20 to 25% , improved with diuresis, now euvolemic, no CAD on LHC Patient was optimized from cardiovascular perspective.  -5/20 Laparoscopic assisted extended right hemicolectomy   Subjective: -He is fair, no events overnight  Assessment and Plan:   Colon cancer Iron  deficiency anemia -Status post laparoscopic assisted right hemicolectomy 5/20, Dr. Davonna Estes - Per surgery, CT chest negative for pulmonary mets - Final path pending - Increase activity, DC Foley today - Check anemia panel, add IV iron  if significantly deficient  Acute systolic CHF  -Echocardiogram with reduced LV systolic function with EF 20 to 25% severe LVH of the lateral segment, RV preserved - Cards following, R/LHC 5/19 noted wedge of 13, no CAD -Continue metoprolol  -GDMT limited by hypotension, add Aldactone -Monitor electrolytes  AKI (acute kidney injury) (HCC) -Resolved  Prediabetes A1c is 5.2  Class 2 obesity with body mass index (BMI) of 35 to 39.9 without comorbidity Calculated BMI is 31.7   DVT prophylaxis: Code Status:  Family Communication: Disposition Plan:   Consultants:    Procedures:   Antimicrobials:    Objective: Vitals:   12/19/23 0447 12/19/23 0827 12/19/23 0830 12/19/23 1128  BP: 114/69 115/76 115/76 96/60  Pulse: (!) 105 (!) 103 (!) 104 99  Resp: 17  20 19   Temp: 97.9 F (36.6 C) 97.8 F (36.6 C) 97.8 F (36.6 C) 99 F (37.2 C)  TempSrc: Oral Oral Oral Oral  SpO2: 90%  92% 94%  Weight: 91.1 kg     Height:        Intake/Output  Summary (Last 24 hours) at 12/19/2023 1148 Last data filed at 12/19/2023 0835 Gross per 24 hour  Intake 1535 ml  Output 1070 ml  Net 465 ml   Filed Weights   12/18/23 0605 12/18/23 0754 12/19/23 0447  Weight: 89.1 kg 89.1 kg 91.1 kg    Examination:  General exam: Appears calm and comfortable  Respiratory system: Clear to auscultation Cardiovascular system: S1 & S2 heard, RRR.  Abd: Soft, some tenderness at the, incision sites unremarkable Central nervous system: Alert and oriented. No focal neurological deficits. Extremities: no edema Skin: No rashes Psychiatry:  Mood & affect appropriate.     Data Reviewed:   CBC: Recent Labs  Lab 12/12/23 1642 12/13/23 0607 12/16/23 0700 12/17/23 0500 12/17/23 0910 12/17/23 0911 12/17/23 0915 12/17/23 1720 12/18/23 0314 12/19/23 0254  WBC 6.3   < > 6.1 7.6  --   --   --  6.3 6.3 11.4*  NEUTROABS 3.7  --   --   --   --   --   --  4.1  --   --   HGB 5.1*   < > 8.8* 9.0*   < > 9.5* 9.5* 9.6* 8.9* 8.2*  HCT 18.7*   < > 28.6* 30.0*   < > 28.0* 28.0* 32.6* 29.9* 26.7*  MCV 62.5*   < > 70.8* 72.3*  --   --   --  72.4* 72.4* 74.0*  PLT 512*   < > 413* 410*  --   --   --  440* 404* 331   < > = values in this interval not displayed.   Basic Metabolic Panel: Recent Labs  Lab 12/14/23 1756 12/15/23 0445 12/16/23 0700 12/17/23 0500 12/17/23 0910 12/17/23 0911 12/17/23 0915 12/18/23 0314 12/19/23 0254  NA  --  137 136 135 139 138 138 139 138  K  --  3.9 4.3 4.1 3.7 3.7 3.9 4.4 4.2  CL  --  107 106 105  --   --   --  107 109  CO2  --  20* 22 22  --   --   --  23 22  GLUCOSE  --  95 84 106*  --   --   --  101* 123*  BUN  --  6* 6* 10  --   --   --  9 9  CREATININE  --  1.08* 1.10* 1.28*  --   --   --  1.07* 1.08*  CALCIUM  --  9.1 8.9 8.9  --   --   --  9.1 8.6*  MG 1.8  --   --  2.0  --   --   --   --   --    GFR: Estimated Creatinine Clearance: 60.6 mL/min (A) (by C-G formula based on SCr of 1.08 mg/dL (H)). Liver Function  Tests: Recent Labs  Lab 12/12/23 2052  AST 21  ALT 11  ALKPHOS 82  BILITOT 0.5  PROT 6.8  ALBUMIN 2.8*   No results for input(s): "LIPASE", "AMYLASE" in the last 168 hours. No results for input(s): "AMMONIA" in the last 168 hours. Coagulation Profile: Recent Labs  Lab 12/17/23 1721  INR 1.1   Cardiac Enzymes: No results for input(s): "CKTOTAL", "CKMB", "CKMBINDEX", "TROPONINI" in the last 168 hours. BNP (last 3 results) No results for input(s): "PROBNP" in the last 8760 hours. HbA1C: Recent Labs    12/17/23 1721  HGBA1C 5.2   CBG: Recent Labs  Lab 12/14/23 0727 12/17/23 1047  GLUCAP 85 84   Lipid Profile: No results for input(s): "CHOL", "HDL", "LDLCALC", "TRIG", "CHOLHDL", "LDLDIRECT" in the last 72 hours. Thyroid Function Tests: No results for input(s): "TSH", "T4TOTAL", "FREET4", "T3FREE", "THYROIDAB" in the last 72 hours. Anemia Panel: Recent Labs    12/19/23 1009  FOLATE 9.1  FERRITIN 101  TIBC 259  IRON  13*  RETICCTPCT 1.1   Urine analysis:    Component Value Date/Time   COLORURINE YELLOW 12/12/2023 1642   APPEARANCEUR HAZY (A) 12/12/2023 1642   LABSPEC 1.013 12/12/2023 1642   PHURINE 5.0 12/12/2023 1642   GLUCOSEU NEGATIVE 12/12/2023 1642   HGBUR NEGATIVE 12/12/2023 1642   BILIRUBINUR NEGATIVE 12/12/2023 1642   KETONESUR NEGATIVE 12/12/2023 1642   PROTEINUR 30 (A) 12/12/2023 1642   UROBILINOGEN 0.2 09/07/2014 1615   NITRITE NEGATIVE 12/12/2023 1642   LEUKOCYTESUR LARGE (A) 12/12/2023 1642   Sepsis Labs: @LABRCNTIP (procalcitonin:4,lacticidven:4)  )No results found for this or any previous visit (from the past 240 hours).   Radiology Studies: No results found.   Scheduled Meds:  sodium chloride    Intravenous Once   acetaminophen   1,000 mg Oral Q6H   bupivacaine  liposome  266 mg Infiltration Once   cyanocobalamin   1,000 mcg Subcutaneous Daily   feeding supplement  237 mL Oral BID BM   methocarbamol  500 mg Oral QID   metoprolol   tartrate  25 mg Oral BID   pantoprazole   40 mg Oral BID   sodium chloride  flush  3 mL Intravenous Q12H  spironolactone  12.5 mg Oral Daily   Continuous Infusions:   LOS: 7 days    Time spent:    Deforest Fast, MD [facility]   12/19/2023, 11:48 AM

## 2023-12-20 ENCOUNTER — Other Ambulatory Visit (HOSPITAL_COMMUNITY): Payer: Self-pay

## 2023-12-20 ENCOUNTER — Telehealth (HOSPITAL_COMMUNITY): Payer: Self-pay

## 2023-12-20 ENCOUNTER — Telehealth (HOSPITAL_COMMUNITY): Payer: Self-pay | Admitting: Pharmacy Technician

## 2023-12-20 DIAGNOSIS — I428 Other cardiomyopathies: Secondary | ICD-10-CM | POA: Diagnosis not present

## 2023-12-20 DIAGNOSIS — E43 Unspecified severe protein-calorie malnutrition: Secondary | ICD-10-CM | POA: Insufficient documentation

## 2023-12-20 DIAGNOSIS — D649 Anemia, unspecified: Secondary | ICD-10-CM | POA: Diagnosis not present

## 2023-12-20 DIAGNOSIS — I5021 Acute systolic (congestive) heart failure: Secondary | ICD-10-CM | POA: Diagnosis not present

## 2023-12-20 LAB — BPAM RBC
Blood Product Expiration Date: 202506172359
Blood Product Expiration Date: 202506232359
ISSUE DATE / TIME: 202505201159
Unit Type and Rh: 6200
Unit Type and Rh: 6200

## 2023-12-20 LAB — CBC
HCT: 25.1 % — ABNORMAL LOW (ref 36.0–46.0)
Hemoglobin: 7.6 g/dL — ABNORMAL LOW (ref 12.0–15.0)
MCH: 22.8 pg — ABNORMAL LOW (ref 26.0–34.0)
MCHC: 30.3 g/dL (ref 30.0–36.0)
MCV: 75.1 fL — ABNORMAL LOW (ref 80.0–100.0)
Platelets: 293 10*3/uL (ref 150–400)
RBC: 3.34 MIL/uL — ABNORMAL LOW (ref 3.87–5.11)
RDW: 28.8 % — ABNORMAL HIGH (ref 11.5–15.5)
WBC: 7.5 10*3/uL (ref 4.0–10.5)
nRBC: 0 % (ref 0.0–0.2)

## 2023-12-20 LAB — TYPE AND SCREEN
ABO/RH(D): A POS
Antibody Screen: POSITIVE
Unit division: 0
Unit division: 0

## 2023-12-20 LAB — BASIC METABOLIC PANEL WITH GFR
Anion gap: 7 (ref 5–15)
BUN: 10 mg/dL (ref 8–23)
CO2: 25 mmol/L (ref 22–32)
Calcium: 8.8 mg/dL — ABNORMAL LOW (ref 8.9–10.3)
Chloride: 106 mmol/L (ref 98–111)
Creatinine, Ser: 1.24 mg/dL — ABNORMAL HIGH (ref 0.44–1.00)
GFR, Estimated: 49 mL/min — ABNORMAL LOW (ref >60)
Glucose, Bld: 100 mg/dL — ABNORMAL HIGH (ref 70–99)
Potassium: 3.9 mmol/L (ref 3.5–5.1)
Sodium: 138 mmol/L (ref 135–145)

## 2023-12-20 MED ORDER — DAPAGLIFLOZIN PROPANEDIOL 10 MG PO TABS
10.0000 mg | ORAL_TABLET | Freq: Every day | ORAL | Status: DC
Start: 2023-12-20 — End: 2023-12-22
  Administered 2023-12-20 – 2023-12-22 (×3): 10 mg via ORAL
  Filled 2023-12-20 (×3): qty 1

## 2023-12-20 MED ORDER — METOPROLOL TARTRATE 25 MG PO TABS
37.5000 mg | ORAL_TABLET | Freq: Two times a day (BID) | ORAL | Status: DC
  Administered 2023-12-20 – 2023-12-21 (×2): 37.5 mg via ORAL
  Filled 2023-12-20 (×3): qty 1

## 2023-12-20 MED ORDER — POLYETHYLENE GLYCOL 3350 17 G PO PACK
17.0000 g | PACK | Freq: Every day | ORAL | Status: DC
  Administered 2023-12-20 – 2023-12-21 (×2): 17 g via ORAL
  Filled 2023-12-20 (×3): qty 1

## 2023-12-20 NOTE — Progress Notes (Signed)
   Heart Failure Stewardship Pharmacist Progress Note   PCP: Collins Dean, NP PCP-Cardiologist: Sheryle Donning, MD    HPI:  63 yo F with no significant PMH.  Presented to the ED from urgent care on 5/14 after near syncopal event. Reported a few month history of gradual fatigue, weakness, shortness of breath on exertion, and dizziness. Hgb was 5.1 at urgent care and was referred to the ED for evaluation. FOBT negative. GI consulted.   Underwent endoscopy and colonoscopy on 5/16. Workup significant for transverse colon mass concerning for malignancy s/p biopsy, and gastritis. CT chest/abdomen/pelvis has been obtained, significant for hepatic flexure mass with surrounding mesenteric lymphadenopathy, otherwise no evidence of metastasis. General surgery was consulted plans for hemicolectomy.   Had 12 beats of NSVT on 5/16. ECHO obtained on 5/18 and EF 20-25%, global hypokinesis, G1DD, severe asymmetric LVH, RV normal. Cardiology consulted. Underwent R/LHC on 5/19 and no angiographically evidence of CAD. RA 4, PA 24, wedge 9, CO/CI 7.9/3.6.  Given cath findings, cleared for R hemicolectomy, completed on 5/20.   Denies shortness of breath. No LE edema. Just returned from walking with mobility. Agreeable to using [facility] TOC pharmacy on discharge. Reviewed current GDMT. Farxiga started today - requires prior authorization with her insurance. Pending approval.   Current HF Medications: Beta Blocker: metoprolol  tartrate 37.5 mg BID MRA: spironolactone 12.5 mg daily SGLT2i: Farxiga 10 mg daily  Prior to admission HF Medications: None  Pertinent Lab Values: Serum creatinine 1.24, BUN 10, Potassium 3.9, Sodium 138, Magnesium  2.0, A1c 5.2   Vital Signs: Weight: 195 lbs (admission weight: 195 lbs) Blood pressure: 110/70s  Heart rate: 100s  I/O: incomplete  Medication Assistance / Insurance Benefits Check: Does the patient have prescription insurance?  Yes Type of insurance plan: [place]  Medicaid  Outpatient Pharmacy:  Prior to admission outpatient pharmacy: [facility] Is the patient willing to use [facility] TOC pharmacy at discharge? Yes Is the patient willing to transition their outpatient pharmacy to utilize a [facility] outpatient pharmacy?   Pending    Assessment: 1. Acute systolic CHF (LVEF 20-25%), due to NICM. NYHA class II symptoms. - Does not appear volume overloaded on exam - Agree with increasing metoprolol  tartrate to 37.5 mg BID with tachycardia. Will need to be transitioned to metoprolol  XL prior to discharge. - Consider starting ARB/ARNI tomorrow if BP stable - Continue spironolactone 12.5 mg daily - Agree with starting Farxiga 10 mg daily   Plan: 1) Medication changes recommended at this time: - Start ARB/ARNI tomorrow if BP stable  2) Patient assistance: Viola Greulich copay $4 - Farxiga/Jardiance require prior authorization - Pansy Bogus pending  3)  Education  - Initial education completed - Full education to be completed prior to discharge  Jerilyn Monte, PharmD, BCPS Heart Failure Engineer, building services Phone [phone]

## 2023-12-20 NOTE — Progress Notes (Signed)
 Mobility Specialist Progress Note:   12/20/23 1050  Mobility  Activity Ambulated with assistance in hallway  Level of Assistance Standby assist, set-up cues, supervision of patient - no hands on  Assistive Device None  Distance Ambulated (ft) 500 ft  Activity Response Tolerated well  Mobility Referral Yes  Mobility visit 1 Mobility  Mobility Specialist Start Time (ACUTE ONLY) 1050  Mobility Specialist Stop Time (ACUTE ONLY) 1100  Mobility Specialist Time Calculation (min) (ACUTE ONLY) 10 min   Pt agreeable to mobility session. HR up to 130s with ambulation. No physical assistance required. No overt unsteadiness noted. Pt back in bed with all needs met. Encouraged frequent ambulation.  Oneda Big Mobility Specialist Please contact via SecureChat or  Rehab office at [phone]

## 2023-12-20 NOTE — Plan of Care (Signed)
  Problem: Education: Goal: Knowledge of General Education information will improve Description: Including pain rating scale, medication(s)/side effects and non-pharmacologic comfort measures Outcome: Progressing   Problem: Health Behavior/Discharge Planning: Goal: Ability to manage health-related needs will improve Outcome: Progressing   Problem: Clinical Measurements: Goal: Ability to maintain clinical measurements within normal limits will improve Outcome: Progressing Goal: Will remain free from infection Outcome: Progressing Goal: Diagnostic test results will improve Outcome: Progressing Goal: Respiratory complications will improve Outcome: Progressing Goal: Cardiovascular complication will be avoided Outcome: Progressing   Problem: Activity: Goal: Risk for activity intolerance will decrease Outcome: Progressing   Problem: Nutrition: Goal: Adequate nutrition will be maintained Outcome: Progressing   Problem: Coping: Goal: Level of anxiety will decrease Outcome: Progressing   Problem: Elimination: Goal: Will not experience complications related to bowel motility Outcome: Progressing Goal: Will not experience complications related to urinary retention Outcome: Progressing   Problem: Pain Managment: Goal: General experience of comfort will improve and/or be controlled Outcome: Progressing   Problem: Safety: Goal: Ability to remain free from injury will improve Outcome: Progressing   Problem: Skin Integrity: Goal: Risk for impaired skin integrity will decrease Outcome: Progressing   Problem: Education: Goal: Understanding of discharge needs will improve Outcome: Progressing Goal: Verbalization of understanding of the causes of altered bowel function will improve Outcome: Progressing   Problem: Activity: Goal: Ability to tolerate increased activity will improve Outcome: Progressing   Problem: Bowel/Gastric: Goal: Gastrointestinal status for postoperative  course will improve Outcome: Progressing   Problem: Health Behavior/Discharge Planning: Goal: Identification of community resources to assist with postoperative recovery needs will improve Outcome: Progressing   Problem: Nutritional: Goal: Will attain and maintain optimal nutritional status will improve Outcome: Progressing   Problem: Clinical Measurements: Goal: Postoperative complications will be avoided or minimized Outcome: Progressing   Problem: Respiratory: Goal: Respiratory status will improve Outcome: Progressing   Problem: Skin Integrity: Goal: Will show signs of wound healing Outcome: Progressing   Problem: Education: Goal: Understanding of CV disease, CV risk reduction, and recovery process will improve Outcome: Progressing Goal: Individualized Educational Video(s) Outcome: Progressing   Problem: Activity: Goal: Ability to return to baseline activity level will improve Outcome: Progressing   Problem: Cardiovascular: Goal: Ability to achieve and maintain adequate cardiovascular perfusion will improve Outcome: Progressing Goal: Vascular access site(s) Level 0-1 will be maintained Outcome: Progressing   Problem: Health Behavior/Discharge Planning: Goal: Ability to safely manage health-related needs after discharge will improve Outcome: Progressing

## 2023-12-20 NOTE — Telephone Encounter (Signed)
 Advanced Heart Failure Patient Advocate Encounter  Test billing for this patients current coverage returns a copay of $4 for 90 days of Entresto. Farxiga and Jardiance require prior authorization under this plan.  Kennis Peacock, CPhT Rx Patient Advocate Phone: [phone]

## 2023-12-20 NOTE — Plan of Care (Signed)
?  Problem: Education: ?Goal: Knowledge of General Education information will improve ?Description: Including pain rating scale, medication(s)/side effects and non-pharmacologic comfort measures ?Outcome: Progressing ?  ?Problem: Health Behavior/Discharge Planning: ?Goal: Ability to manage health-related needs will improve ?Outcome: Progressing ?  ?Problem: Clinical Measurements: ?Goal: Diagnostic test results will improve ?Outcome: Progressing ?Goal: Cardiovascular complication will be avoided ?Outcome: Progressing ?  ?

## 2023-12-20 NOTE — Progress Notes (Addendum)
 [facility] Progress Note  2 Days Post-Op  Subjective: CC:  Denies nausea or abdominal pain. No BM or flatus.  Pain well controlled  Objective: Vital signs in last 24 hours: Temp:  [98.1 F (36.7 C)-99 F (37.2 C)] 98.1 F (36.7 C) (05/22 1111) Pulse Rate:  [99-105] 105 (05/22 1111) Resp:  [16-20] 20 (05/22 1111) BP: (95-116)/(56-79) 116/71 (05/22 1111) SpO2:  [93 %-97 %] 96 % (05/22 1111) Weight:  [88.9 kg] 88.9 kg (05/22 0508) Last BM Date : 12/18/23  Intake/Output from previous day: 05/21 0701 - 05/22 0700 In: 991.4 [P.O.:837; IV Piggyback:154.4] Out: 200 [Urine:200] Intake/Output this shift: Total I/O In: 360 [P.O.:360] Out: -   PE: Gen:  Alert, NAD, pleasant Card:  Regular rate and rhythm Pulm:  Normal effort ORA Abd: Soft, appropriately tender, incisions c/d/I with subcuticular sutures/dermabond and no cellulitis or drainage GU: ~ 100 mL clear yellow urine in bag  Skin: warm and dry, no rashes  Psych: A&Ox3   Lab Results:  Recent Labs    12/19/23 0254 12/20/23 0302  WBC 11.4* 7.5  HGB 8.2* 7.6*  HCT 26.7* 25.1*  PLT 331 293   BMET Recent Labs    12/19/23 0254 12/20/23 0302  NA 138 138  K 4.2 3.9  CL 109 106  CO2 22 25  GLUCOSE 123* 100*  BUN 9 10  CREATININE 1.08* 1.24*  CALCIUM 8.6* 8.8*   PT/INR Recent Labs    12/17/23 1721  LABPROT 14.0  INR 1.1   CMP     Component Value Date/Time   NA 138 12/20/2023 0302   NA 140 09/29/2019 1449   K 3.9 12/20/2023 0302   CL 106 12/20/2023 0302   CO2 25 12/20/2023 0302   GLUCOSE 100 (H) 12/20/2023 0302   BUN 10 12/20/2023 0302   BUN 17 09/29/2019 1449   CREATININE 1.24 (H) 12/20/2023 0302   CALCIUM 8.8 (L) 12/20/2023 0302   PROT 6.8 12/12/2023 2052   PROT 7.6 09/29/2019 1449   ALBUMIN 2.8 (L) 12/12/2023 2052   ALBUMIN 4.6 09/29/2019 1449   AST 21 12/12/2023 2052   ALT 11 12/12/2023 2052   ALKPHOS 82 12/12/2023 2052   BILITOT 0.5 12/12/2023 2052   BILITOT 0.3 09/29/2019  1449   GFRNONAA 49 (L) 12/20/2023 0302   GFRAA 70 09/29/2019 1449   Lipase  No results found for: "LIPASE"     Studies/Results: No results found.   Anti-infectives: Anti-infectives (From admission, onward)    Start     Dose/Rate Route Frequency Ordered Stop   12/18/23 0815  cefoTEtan  (CEFOTAN ) 2 g in sodium chloride  0.9 % 100 mL IVPB        2 g 200 mL/hr over 30 Minutes Intravenous On call to O.R. 12/18/23 4098 12/18/23 0939   12/18/23 0600  cefoTEtan  (CEFOTAN ) 2 g in sodium chloride  0.9 % 100 mL IVPB  Status:  Discontinued        2 g 200 mL/hr over 30 Minutes Intravenous On call to O.R. 12/17/23 1624 12/18/23 0810   12/17/23 2200  neomycin  (MYCIFRADIN ) tablet 1,000 mg  Status:  Discontinued       Placed in "And" Linked Group   1,000 mg Oral 3 times per day 12/17/23 1624 12/18/23 1413   12/17/23 2200  metroNIDAZOLE  (FLAGYL ) tablet 1,000 mg  Status:  Discontinued       Placed in "And" Linked Group   1,000 mg Oral 3 times per day 12/17/23 1624 12/18/23 1413  12/17/23 0600  cefoTEtan  (CEFOTAN ) 2 g in sodium chloride  0.9 % 100 mL IVPB  Status:  Discontinued        2 g 200 mL/hr over 30 Minutes Intravenous On call to O.R. 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 Davonna Estes  - Adv to regular diet. AROBF - multimodal pain control: tylenol  1000 mg q 6h, add robaxin 500 mg QID, oxycodone  5 mg q6h PRN, dilaudid for breakthrough - CEA on 5/17 was 27.6  - CT chest without evidence of pulmonary nodules/masses  - await final pathology,  then will need oncology consult  - OOB today, IS  FEN: regular diet ID: perioperative cefotan  given VTE: SCD's, ok for chemical DVT ppx with lovenox  or SQH Foley: removed Dispo: TRH service   ABL/IDA anemia - due to above Acute systolic heart failure w reduced EF Prediabetes AKI  Obesity    LOS: 8 days   I reviewed nursing notes, hospitalist notes, last 24 h vitals and pain scores, last 48 h intake and output, last 24 h labs and trends, and last 24 h imaging results.  This care required straight-forward level of medical decision making.   Armond Bertin, MD [facility] Please see Amion for pager number during day hours 7:00am-4:30pm

## 2023-12-20 NOTE — Telephone Encounter (Signed)
 Pharmacy Patient Advocate Encounter   Received notification from Inpatient Request that prior authorization for Farxiga 10mg  tablets is required/requested.   Insurance verification completed.   The patient is insured through [facility] .   Per test claim: PA required; PA started via CoverMyMeds. KEY [id number] . Waiting for clinical questions to populate.

## 2023-12-20 NOTE — Telephone Encounter (Signed)
 Pharmacy Patient Advocate Encounter   Received notification from Inpatient Request that prior authorization for JARDIANCE 10MG  TABLETS is required/requested.   Insurance verification completed.   The patient is insured through [facility] .   Per test claim: PA required; PA started via CoverMyMeds. KEY [id number] . Waiting for clinical questions to populate.

## 2023-12-20 NOTE — Telephone Encounter (Signed)
 Patient Advocate Encounter  Prior authorization for Farxiga has been submitted and approved. Test billing returns $4 for 90 day supply.  Key: [id number] Effective: 12/20/2023 to 12/19/2024  Kennis Peacock, CPhT Rx Patient Advocate Phone: [phone]

## 2023-12-20 NOTE — Progress Notes (Signed)
 PROGRESS NOTE    Leah Hernandez  HYQ:[id number] DOB: 03-05-1961 DOA: 12/12/2023 PCP: Collins Dean, NP  63/F with no significant PMH presented to the ED after a syncopal episode, history of fatigue for few months and dyspnea on exertion Initial labs showed hemoglobin of 5.1.,  Hemoccult was negative, transfused PRBC  -> Colonoscopy significant for colon mass s/p biopsy, as well noted to have NSVT's, workup significant for low EF 20 to 25% , improved with diuresis, now euvolemic, no CAD on LHC Patient was optimized from cardiovascular perspective.  -5/20 Laparoscopic assisted extended right hemicolectomy   Subjective: - Feels fair, still no BM or flatus, breathing is okay  Assessment and Plan:   Colon cancer Iron  deficiency anemia -Status post laparoscopic assisted right hemicolectomy 5/20, Dr. Davonna Estes - Per surgery, CT chest negative for pulmonary mets - Final path pending - Increase activity, await resolution of postop ileus - Severe iron  deficiency, continue IV iron  today  Acute systolic CHF  -Echocardiogram with reduced LV systolic function with EF 20 to 25% severe LVH of the lateral segment, RV preserved - Cards following, R/LHC 5/19 noted wedge of 13, no CAD -Continue metoprolol , Aldactone -Cards following, starting Farxiga today, appears euvolemic -Monitor electrolytes  AKI (acute kidney injury) (HCC) -Resolved  Prediabetes A1c is 5.2  Class 2 obesity with body mass index (BMI) of 35 to 39.9 without comorbidity Calculated BMI is 31.7   DVT prophylaxis: SCDs Code Status: Full code Family Communication: None present Disposition Plan:   Consultants:    Procedures:   Antimicrobials:    Objective: Vitals:   12/19/23 2309 12/20/23 0508 12/20/23 0717 12/20/23 1111  BP: 104/72 (!) 100/56 116/68 116/71  Pulse: (!) 102 99 100 (!) 105  Resp: 20 20 16 20   Temp: 98.5 F (36.9 C) 98.3 F (36.8 C) 98.1 F (36.7 C) 98.1 F (36.7 C)  TempSrc: Oral Oral  Oral Oral  SpO2: 93% 94% 94% 96%  Weight:  88.9 kg    Height:        Intake/Output Summary (Last 24 hours) at 12/20/2023 1236 Last data filed at 12/20/2023 1112 Gross per 24 hour  Intake 1111.42 ml  Output --  Net 1111.42 ml   Filed Weights   12/18/23 0754 12/19/23 0447 12/20/23 0508  Weight: 89.1 kg 91.1 kg 88.9 kg    Examination:  General exam: Appears calm and comfortable  Respiratory system: Clear to auscultation Cardiovascular system: S1 & S2 heard, RRR.  Abd: Soft, some tenderness at the, incision sites unremarkable, diminished bowel sounds Central nervous system: Alert and oriented. No focal neurological deficits. Extremities: no edema Skin: No rashes Psychiatry:  Mood & affect appropriate.     Data Reviewed:   CBC: Recent Labs  Lab 12/17/23 0500 12/17/23 0910 12/17/23 0915 12/17/23 1720 12/18/23 0314 12/19/23 0254 12/20/23 0302  WBC 7.6  --   --  6.3 6.3 11.4* 7.5  NEUTROABS  --   --   --  4.1  --   --   --   HGB 9.0*   < > 9.5* 9.6* 8.9* 8.2* 7.6*  HCT 30.0*   < > 28.0* 32.6* 29.9* 26.7* 25.1*  MCV 72.3*  --   --  72.4* 72.4* 74.0* 75.1*  PLT 410*  --   --  440* 404* 331 293   < > = values in this interval not displayed.   Basic Metabolic Panel: Recent Labs  Lab 12/14/23 1756 12/15/23 0445 12/16/23 0700 12/17/23 0500 12/17/23 0910 12/17/23  1610 12/17/23 0915 12/18/23 0314 12/19/23 0254 12/20/23 0302  NA  --    < > 136 135   < > 138 138 139 138 138  K  --    < > 4.3 4.1   < > 3.7 3.9 4.4 4.2 3.9  CL  --    < > 106 105  --   --   --  107 109 106  CO2  --    < > 22 22  --   --   --  23 22 25   GLUCOSE  --    < > 84 106*  --   --   --  101* 123* 100*  BUN  --    < > 6* 10  --   --   --  9 9 10   CREATININE  --    < > 1.10* 1.28*  --   --   --  1.07* 1.08* 1.24*  CALCIUM  --    < > 8.9 8.9  --   --   --  9.1 8.6* 8.8*  MG 1.8  --   --  2.0  --   --   --   --   --   --    < > = values in this interval not displayed.   GFR: Estimated  Creatinine Clearance: 52.1 mL/min (A) (by C-G formula based on SCr of 1.24 mg/dL (H)). Liver Function Tests: No results for input(s): "AST", "ALT", "ALKPHOS", "BILITOT", "PROT", "ALBUMIN" in the last 168 hours.  No results for input(s): "LIPASE", "AMYLASE" in the last 168 hours. No results for input(s): "AMMONIA" in the last 168 hours. Coagulation Profile: Recent Labs  Lab 12/17/23 1721  INR 1.1   Cardiac Enzymes: No results for input(s): "CKTOTAL", "CKMB", "CKMBINDEX", "TROPONINI" in the last 168 hours. BNP (last 3 results) No results for input(s): "PROBNP" in the last 8760 hours. HbA1C: Recent Labs    12/17/23 1721  HGBA1C 5.2   CBG: Recent Labs  Lab 12/14/23 0727 12/17/23 1047  GLUCAP 85 84   Lipid Profile: No results for input(s): "CHOL", "HDL", "LDLCALC", "TRIG", "CHOLHDL", "LDLDIRECT" in the last 72 hours. Thyroid Function Tests: No results for input(s): "TSH", "T4TOTAL", "FREET4", "T3FREE", "THYROIDAB" in the last 72 hours. Anemia Panel: Recent Labs    12/19/23 1009  VITAMINB12 >7,500*  FOLATE 9.1  FERRITIN 101  TIBC 259  IRON  13*  RETICCTPCT 1.1   Urine analysis:    Component Value Date/Time   COLORURINE YELLOW 12/12/2023 1642   APPEARANCEUR HAZY (A) 12/12/2023 1642   LABSPEC 1.013 12/12/2023 1642   PHURINE 5.0 12/12/2023 1642   GLUCOSEU NEGATIVE 12/12/2023 1642   HGBUR NEGATIVE 12/12/2023 1642   BILIRUBINUR NEGATIVE 12/12/2023 1642   KETONESUR NEGATIVE 12/12/2023 1642   PROTEINUR 30 (A) 12/12/2023 1642   UROBILINOGEN 0.2 09/07/2014 1615   NITRITE NEGATIVE 12/12/2023 1642   LEUKOCYTESUR LARGE (A) 12/12/2023 1642   Sepsis Labs: @LABRCNTIP (procalcitonin:4,lacticidven:4)  )No results found for this or any previous visit (from the past 240 hours).   Radiology Studies: No results found.   Scheduled Meds:  sodium chloride    Intravenous Once   acetaminophen   1,000 mg Oral Q6H   bupivacaine  liposome  266 mg Infiltration Once   cyanocobalamin    1,000 mcg Subcutaneous Daily   dapagliflozin propanediol  10 mg Oral Daily   feeding supplement  237 mL Oral BID BM   methocarbamol  500 mg Oral QID   metoprolol  tartrate  37.5 mg  Oral BID   multivitamin with minerals  1 tablet Oral Daily   pantoprazole   40 mg Oral BID   polyethylene glycol  17 g Oral Daily   sodium chloride  flush  3 mL Intravenous Q12H   spironolactone  12.5 mg Oral Daily   thiamine  100 mg Oral Daily   Continuous Infusions:  iron  sucrose (VENOFER ) 200 mg in sodium chloride  0.9 % 100 mL IVPB Stopped (12/19/23 1536)     LOS: 8 days    Time spent:    Deforest Fast, MD [facility]   12/20/2023, 12:36 PM

## 2023-12-20 NOTE — Progress Notes (Signed)
 Rounding Note    Patient Name: Leah Hernandez Date of Encounter: 12/20/2023  [facility] Cardiologist: Sheryle Donning, MD   Subjective   Post op day 2 from her hemicolectomy. Walked the halls some yesterday and did well. Still not with a bowel movement/flatus, working on this but tolerating PO. Breathing is stable, no chest pain. Reviewed plans for medication management, see below.  Inpatient Medications    Scheduled Meds:  sodium chloride    Intravenous Once   acetaminophen   1,000 mg Oral Q6H   bupivacaine  liposome  266 mg Infiltration Once   cyanocobalamin   1,000 mcg Subcutaneous Daily   feeding supplement  237 mL Oral BID BM   methocarbamol  500 mg Oral QID   metoprolol  tartrate  37.5 mg Oral BID   multivitamin with minerals  1 tablet Oral Daily   pantoprazole   40 mg Oral BID   sodium chloride  flush  3 mL Intravenous Q12H   spironolactone  12.5 mg Oral Daily   thiamine  100 mg Oral Daily   Continuous Infusions:  iron  sucrose (VENOFER ) 200 mg in sodium chloride  0.9 % 100 mL IVPB Stopped (12/19/23 1536)    PRN Meds: HYDROmorphone (DILAUDID) injection, melatonin, ondansetron  **OR** ondansetron  (ZOFRAN ) IV, oxyCODONE , sodium chloride  flush   Vital Signs    Vitals:   12/19/23 1936 12/19/23 2309 12/20/23 0508 12/20/23 0717  BP: 95/64 104/72 (!) 100/56 116/68  Pulse: 100 (!) 102 99 100  Resp: 19 20 20 16   Temp: 98.3 F (36.8 C) 98.5 F (36.9 C) 98.3 F (36.8 C) 98.1 F (36.7 C)  TempSrc: Oral Oral Oral Oral  SpO2: 93% 93% 94% 94%  Weight:   88.9 kg   Height:        Intake/Output Summary (Last 24 hours) at 12/20/2023 1016 Last data filed at 12/19/2023 1830 Gross per 24 hour  Intake 751.42 ml  Output --  Net 751.42 ml      12/20/2023    5:08 AM 12/19/2023    4:47 AM 12/18/2023    7:54 AM  Last 3 Weights  Weight (lbs) 195 lb 14.4 oz 200 lb 12.5 oz 196 lb 6.9 oz  Weight (kg) 88.86 kg 91.074 kg 89.1 kg      Telemetry    SR with 5 beats  NSVT this AM- Personally Reviewed  Physical Exam   GEN: No acute distress.   Neck: No JVD Cardiac: RRR, no murmurs, rubs, or gallops.  Respiratory: Clear to auscultation bilaterally. MS: No edema; No deformity. Neuro:  Nonfocal  Psych: Normal affect   New pertinent results (labs, ECG, imaging, cardiac studies)    Echo, cath images personally reviewed.  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 RHC with normal CO/CI.  -was started on spironolactone yesterday. Cr up slightly, not unexpected. K ok.  -if BP allows, would start low dose ARB, though may not be able to do this until outpatient -discussed SGLT2i today, she does not have any chronic issues with UTI/yeast infection and is amenable. Will ask pharmacy for pricing and start today -suspect she will be able to use lasix only PRN at home, discussed monitoring symptoms and daily weights today -I/O and weights inaccurate. Admission weight listed as 106.9 kg on 5/14, but then weight 5/19 was 88.6 kg, which  is more consistent. Peak weight during admission 91.1 kg, now downtrending to 88.9 kg. Output not well documented so cannot rely on I/O -see below; Hgb trending down, 7.6 today. With her cardiomyopathy, if it drops further tomorrow AM, would recommend transfusion  NSVT -on metoprolol , consolidate to succinate prior to discharge (ok to leave tartrate perioperatively).  -Has mild tachycardia today, likely exacerbated by anemia as well, will uptitrate slightly today from 25 mg BID to 37.5 mg BID, monitor for hypotension.  Anemia, severe, requiring transfusion Lower GI bleed Colon mass -concern for colon cancer as etiology -now s/p hemicolectomy 12/18/23, awaiting pathology -has required transfusion this admission. With low EF,  would transfuse if it continues to trend down as Hgb 7.6 today    Signed, Sheryle Donning, MD  12/20/2023, 10:16 AM

## 2023-12-21 DIAGNOSIS — I5021 Acute systolic (congestive) heart failure: Secondary | ICD-10-CM | POA: Diagnosis not present

## 2023-12-21 DIAGNOSIS — I428 Other cardiomyopathies: Secondary | ICD-10-CM | POA: Diagnosis not present

## 2023-12-21 DIAGNOSIS — C189 Malignant neoplasm of colon, unspecified: Secondary | ICD-10-CM | POA: Diagnosis not present

## 2023-12-21 DIAGNOSIS — D649 Anemia, unspecified: Secondary | ICD-10-CM | POA: Diagnosis not present

## 2023-12-21 LAB — BASIC METABOLIC PANEL WITH GFR
Anion gap: 14 (ref 5–15)
BUN: 10 mg/dL (ref 8–23)
CO2: 22 mmol/L (ref 22–32)
Calcium: 9.4 mg/dL (ref 8.9–10.3)
Chloride: 103 mmol/L (ref 98–111)
Creatinine, Ser: 1.07 mg/dL — ABNORMAL HIGH (ref 0.44–1.00)
GFR, Estimated: 58 mL/min — ABNORMAL LOW (ref >60)
Glucose, Bld: 94 mg/dL (ref 70–99)
Potassium: 3.8 mmol/L (ref 3.5–5.1)
Sodium: 139 mmol/L (ref 135–145)

## 2023-12-21 LAB — CBC
HCT: 29.6 % — ABNORMAL LOW (ref 36.0–46.0)
Hemoglobin: 9.2 g/dL — ABNORMAL LOW (ref 12.0–15.0)
MCH: 23 pg — ABNORMAL LOW (ref 26.0–34.0)
MCHC: 31.1 g/dL (ref 30.0–36.0)
MCV: 74 fL — ABNORMAL LOW (ref 80.0–100.0)
Platelets: 390 10*3/uL (ref 150–400)
RBC: 4 MIL/uL (ref 3.87–5.11)
WBC: 8.1 10*3/uL (ref 4.0–10.5)
nRBC: 0.2 % (ref 0.0–0.2)

## 2023-12-21 LAB — SURGICAL PATHOLOGY

## 2023-12-21 MED ORDER — METOPROLOL SUCCINATE ER 100 MG PO TB24
100.0000 mg | ORAL_TABLET | Freq: Every day | ORAL | Status: DC
  Administered 2023-12-22: 100 mg via ORAL
  Filled 2023-12-21: qty 1

## 2023-12-21 MED ORDER — METOPROLOL TARTRATE 50 MG PO TABS
50.0000 mg | ORAL_TABLET | Freq: Two times a day (BID) | ORAL | Status: AC
  Administered 2023-12-21: 50 mg via ORAL
  Filled 2023-12-21: qty 1

## 2023-12-21 NOTE — Progress Notes (Signed)
 Mobility Specialist: Progress Note   12/21/23 1145  Mobility  Activity Ambulated with assistance in hallway  Level of Assistance Standby assist, set-up cues, supervision of patient - no hands on  Assistive Device None  Distance Ambulated (ft) 400 ft  Activity Response Tolerated well  Mobility Referral Yes  Mobility visit 1 Mobility  Mobility Specialist Start Time (ACUTE ONLY) [id number]  Mobility Specialist Stop Time (ACUTE ONLY) 0939  Mobility Specialist Time Calculation (min) (ACUTE ONLY) 11 min    Pre Mobility: HR 107 During Mobility: HR 112-122 Post Mobility: HR 108  Pt was agreeable to mobility session - received in bed. No complaints. Ambulated down the hallway and back with fault. Returned to room and agreed to sit in the chair for awhile. Sv throughout. Left in chair with all needs met, call bell in reach.   Deloria Fetch Mobility Specialist Please contact via SecureChat or Rehab office at [phone]

## 2023-12-21 NOTE — Progress Notes (Signed)
 HEART & VASCULAR TRANSITION OF CARE CONSULT NOTE    Referring Physician: Dr. Drexel Gentles PCP: Collins Dean, NP  Oncology: Dr. Maryalice Smaller Cardiologist: Dr. Veryl Gottron  HPI: Referred to clinic by Dr. Drexel Gentles for heart failure consultation.   Leah Hernandez is a 63 y.o. female with no previous past medical history, newly diagnosed with systolic heart failure, anemia and colon CA.  Admitted 5/25 with near-syncope. Hgb 5.1. Underwent EGD/colonoscopy showing transverse colon mass concerning for malignancy; s/p biopsy, and gastritis. General surgery was consulted plans for hemicolectomy. Had several runs of VT, echo arranged and showed EF 20-25%, global hypokinesis, G1DD, severe asymmetric LVH, RV normal. Cardiology consulted. Underwent R/LHC showing no CAD, stable filling pressures and preserved CI 3.6. Underwent R hemicolectomy. GDMT titrated and she was discharged home, weight 193 lbs.  Today she presents to [facility] for post hospital HF follow up with her husband. Overall feeling fine. Not very active, takes her time since surgery, but no SOB with ADLs. Denies palpitations, abnormal bleeding, CP, dizziness, edema, or PND/Orthopnea. Appetite ok. Weight at home 187-191 pounds. Taking all medications.   Family Hx: mother died in her 63's of MI, sister died in 25's of MI  Cardiac Testing   - R/LHC (5/25): no CAD; RA 7, PA 34/16 (22), PCWP 13, CO/CI (Fick) 7.9/3.6  - Echo (5/25): EF 20-25%, normal RV  Past Medical History:  Diagnosis Date   Prediabetes    Current Outpatient Medications  Medication Sig Dispense Refill   acetaminophen  (TYLENOL ) 325 MG tablet Take 325-650 mg by mouth every 6 (six) hours as needed for mild pain (pain score 1-3) or moderate pain (pain score 4-6).     dapagliflozin propanediol (FARXIGA) 10 MG TABS tablet Take 1 tablet (10 mg total) by mouth daily. 30 tablet 1   methocarbamol (ROBAXIN) 500 MG tablet Take 500 mg by mouth 3 (three) times daily.     metoprolol  succinate  (TOPROL -XL) 100 MG 24 hr tablet Take 1 tablet (100 mg total) by mouth daily. Take with or immediately following a meal. 30 tablet 1   oxyCODONE  (OXY IR/ROXICODONE ) 5 MG immediate release tablet Take 1 tablet (5 mg total) by mouth every 6 (six) hours as needed for severe pain (pain score 7-10). 30 tablet 0   polyethylene glycol powder (GLYCOLAX /MIRALAX ) 17 GM/SCOOP powder Take 17 g by mouth daily. 238 g 0   spironolactone (ALDACTONE) 25 MG tablet Take 0.5 tablets (12.5 mg total) by mouth daily. 30 tablet 1   No current facility-administered medications for this encounter.   No Known Allergies  Social History   Socioeconomic History   Marital status: Married    Spouse name: Not on file   Number of children: 4   Years of education: Not on file   Highest education level: Not on file  Occupational History   Not on file  Tobacco Use   Smoking status: Never   Smokeless tobacco: Never  Vaping Use   Vaping status: Never Used  Substance and Sexual Activity   Alcohol use: No   Drug use: No   Sexual activity: Yes    Birth control/protection: Surgical  Other Topics Concern   Not on file  Social History Narrative   Not on file   Social Drivers of Health   Financial Resource Strain: Not on file  Food Insecurity: No Food Insecurity (12/26/2023)   Hunger Vital Sign    Worried About Running Out of Food in the Last Year: Never true  Ran Out of Food in the Last Year: Never true  Transportation Needs: No Transportation Needs (12/26/2023)   PRAPARE - Administrator, Civil Service (Medical): No    Lack of Transportation (Non-Medical): No  Physical Activity: Not on file  Stress: Not on file  Social Connections: Not on file  Intimate Partner Violence: Not At Risk (12/26/2023)   Humiliation, Afraid, Rape, and Kick questionnaire    Fear of Current or Ex-Partner: No    Emotionally Abused: No    Physically Abused: No    Sexually Abused: No    Family History  Problem Relation Age  of Onset   Cancer Brother    Hypertension Neg Hx    Wt Readings from Last 3 Encounters:  12/28/23 87.2 kg (192 lb 3.2 oz)  12/26/23 87.8 kg (193 lb 9.6 oz)  12/21/23 87.6 kg (193 lb 2 oz)   BP 102/70   Pulse 99   Wt 87.2 kg (192 lb 3.2 oz)   SpO2 100%   BMI 31.02 kg/m   PHYSICAL EXAM: General:  NAD. No resp difficulty, arrived in WC, fatigued-appearing HEENT: Normal Neck: Supple. No JVD. Cor: Regular rate & rhythm. No rubs, gallops or murmurs. Lungs: Clear Abdomen: Soft, nontender, nondistended.  Extremities: No cyanosis, clubbing, rash, edema Neuro: Alert & oriented x 3, moves all 4 extremities w/o difficulty. Affect pleasant.  ECG (personally reviewed): NSR 99 bpm  ReDs reading: 25%, normal  ASSESSMENT & PLAN: Chronic Systolic Heart Failure - NICM - Echo 5/25: EF 20-25%, normal RV (images personally reviewed with Dr. Julane Ny) - R/LHC 5/25: no CAD, RHC with normal CI and filling pressures. - Unclear etiology, progressive symptoms x 2 months, could be from anemia. - Check cMRI (2nd iron  infusion scheduled for 01/07/24, will need to wait 4-6 weeks after this for cMRI) - NYHA II-early III, functional status confounded by recent surgery/deconditioning. Volume OK today, REDS 25% - Start digoxin 0.125 mg daily. - Give Rx for Lasix 40 mg PRN - No BP room for losartan yet - Continue spironolactone 12.5 mg daily. - Continue Farxiga 10 mg daily. No GU symptoms - Continue Toprol  XL 100 mg daily. - Consider ivabradine next if HR remains elevated ($4). - Labs from 12/22/23 showed K 3.8, SCr 1.25, Hgb 8.3 - Check dig trough at follow up - Discuss Cardiac Rehab when cleared for activity from surgeon. - Repeat echo in 3 months  2. Anemia - Most recent Hgb 8.3 - Onc arranging iron  infusions  3. Colon mass - s/p R hemicolectomy. 28 lymph nodes neg - path showed invasive adenocarcinoma - Surgery was considered curative, but she has high risk features that place her at moderate  risk for recurrence.  NYHA II-early III GDMT  Diuretic: Lasix 40 mg PRN BB: Continue Toprol  XL 100 mg daily Ace/ARB/ARNI: not yet MRA: spiro 12.5 mg daily SGLT2i: Farxiga 10 mg daily  Referred to HFSW (PCP, Medications, Transportation, ETOH Abuse, Drug Abuse, Insurance, Surveyor, quantity ): No Refer to Pharmacy: No Refer to Home Health: No Refer to Advanced Heart Failure Clinic: Yes, assign to Dr. Alease Amend Refer to General Cardiology: No, established with Dr. Veryl Gottron  Follow up in 3-4 weeks with APP (dig trough, bmet and add losartan) and 3 months with Dr. Alease Amend + echo.  Vernia Good, FNP-BC 12/28/23

## 2023-12-21 NOTE — Progress Notes (Signed)
 [facility] Progress Note  3 Days Post-Op  Subjective: CC:  Experienced emesis last night but then had 2 BM and has been tolerating PO today. Denies pain or other complaints.   Objective: Vital signs in last 24 hours: Temp:  [98.2 F (36.8 C)-98.9 F (37.2 C)] 98.5 F (36.9 C) (05/23 1516) Pulse Rate:  [106-121] 110 (05/23 1516) Resp:  [16-20] 20 (05/23 1516) BP: (107-123)/(73-85) 111/85 (05/23 1516) SpO2:  [93 %-97 %] 97 % (05/23 1516) Weight:  [87.6 kg] 87.6 kg (05/23 0453) Last BM Date : 12/20/23  Intake/Output from previous day: 05/22 0701 - 05/23 0700 In: 600 [P.O.:600] Out: -  Intake/Output this shift: Total I/O In: 60 [P.O.:60] Out: -   PE: Gen:  Alert, NAD, pleasant Card:  Regular rate and rhythm Pulm:  Normal effort ORA Abd: Soft, appropriately tender, incisions c/d/I with subcuticular sutures/dermabond and no cellulitis or drainage Skin: warm and dry, no rashes  Psych: A&Ox3   Lab Results:  Recent Labs    12/20/23 0302 12/21/23 0313  WBC 7.5 8.1  HGB 7.6* 9.2*  HCT 25.1* 29.6*  PLT 293 390   BMET Recent Labs    12/20/23 0302 12/21/23 0313  NA 138 139  K 3.9 3.8  CL 106 103  CO2 25 22  GLUCOSE 100* 94  BUN 10 10  CREATININE 1.24* 1.07*  CALCIUM 8.8* 9.4   PT/INR No results for input(s): "LABPROT", "INR" in the last 72 hours.  CMP     Component Value Date/Time   NA 139 12/21/2023 0313   NA 140 09/29/2019 1449   K 3.8 12/21/2023 0313   CL 103 12/21/2023 0313   CO2 22 12/21/2023 0313   GLUCOSE 94 12/21/2023 0313   BUN 10 12/21/2023 0313   BUN 17 09/29/2019 1449   CREATININE 1.07 (H) 12/21/2023 0313   CALCIUM 9.4 12/21/2023 0313   PROT 6.8 12/12/2023 2052   PROT 7.6 09/29/2019 1449   ALBUMIN 2.8 (L) 12/12/2023 2052   ALBUMIN 4.6 09/29/2019 1449   AST 21 12/12/2023 2052   ALT 11 12/12/2023 2052   ALKPHOS 82 12/12/2023 2052   BILITOT 0.5 12/12/2023 2052   BILITOT 0.3 09/29/2019 1449   GFRNONAA 58 (L) 12/21/2023  0313   GFRAA 70 09/29/2019 1449   Lipase  No results found for: "LIPASE"     Studies/Results: No results found.   Anti-infectives: Anti-infectives (From admission, onward)    Start     Dose/Rate Route Frequency Ordered Stop   12/18/23 0815  cefoTEtan  (CEFOTAN ) 2 g in sodium chloride  0.9 % 100 mL IVPB        2 g 200 mL/hr over 30 Minutes Intravenous On call to O.R. 12/18/23 5409 12/18/23 0939   12/18/23 0600  cefoTEtan  (CEFOTAN ) 2 g in sodium chloride  0.9 % 100 mL IVPB  Status:  Discontinued        2 g 200 mL/hr over 30 Minutes Intravenous On call to O.R. 12/17/23 1624 12/18/23 0810   12/17/23 2200  neomycin  (MYCIFRADIN ) tablet 1,000 mg  Status:  Discontinued       Placed in "And" Linked Group   1,000 mg Oral 3 times per day 12/17/23 1624 12/18/23 1413   12/17/23 2200  metroNIDAZOLE  (FLAGYL ) tablet 1,000 mg  Status:  Discontinued       Placed in "And" Linked Group   1,000 mg Oral 3 times per day 12/17/23 1624 12/18/23 1413   12/17/23 0600  cefoTEtan  (CEFOTAN ) 2 g in sodium  chloride 0.9 % 100 mL IVPB  Status:  Discontinued        2 g 200 mL/hr over 30 Minutes Intravenous On call to O.R. 12/16/23 1421 12/17/23 1427   12/16/23 1515  neomycin  (MYCIFRADIN ) tablet 1,000 mg  Status:  Discontinued       Placed in "And" Linked Group   1,000 mg Oral 3 times per day 12/16/23 1421 12/17/23 1427   12/16/23 1515  metroNIDAZOLE  (FLAGYL ) tablet 1,000 mg  Status:  Discontinued       Placed in "And" Linked Group   1,000 mg Oral 3 times per day 12/16/23 1421 12/17/23 1427        Assessment/Plan Transverse colon cancer POD#3 s/p laparoscopic assisted extended right hemicolectomy 5/20 Dr. Davonna Estes  - Okay to regular diet from surgery perspective - multimodal pain control: tylenol  1000 mg q 6h, robaxin 500 mg QID, oxycodone  5 mg q6h PRN, dilaudid for breakthrough - Oncology planning outpatient follow up - Possible discharge tomorrow - OOB today, IS  FEN: regular diet ID: perioperative  cefotan  given VTE: SCD's, Lovenox  Foley: removed Dispo: TRH service   ABL/IDA anemia - due to above Acute systolic heart failure w reduced EF Prediabetes AKI  Obesity    LOS: 9 days   I reviewed nursing notes, hospitalist notes, last 24 h vitals and pain scores, last 48 h intake and output, last 24 h labs and trends, and last 24 h imaging results.  This care required straight-forward level of medical decision making.   Armond Bertin, MD [facility] Please see Amion for pager number during day hours 7:00am-4:30pm

## 2023-12-21 NOTE — Telephone Encounter (Signed)
 Pharmacy Patient Advocate Encounter  Received notification from [facility] that Prior Authorization for JARDIANCE 10MG  TABLETS  has been APPROVED from 12/21/2023 to 12/20/2024   PA #/Case ID/Reference #: [id number]

## 2023-12-21 NOTE — Progress Notes (Signed)
 PROGRESS NOTE    Leah Hernandez  WUJ:[id number] DOB: Aug 21, 1960 DOA: 12/12/2023 PCP: Collins Dean, NP  63/F with no significant PMH presented to the ED after a syncopal episode, history of fatigue for few months and dyspnea on exertion Initial labs showed hemoglobin of 5.1.,  Hemoccult was negative, transfused PRBC  -> Colonoscopy significant for colon mass s/p biopsy, as well noted to have NSVT's, workup significant for low EF 20 to 25% , improved with diuresis, now euvolemic, no CAD on LHC Patient was optimized from cardiovascular perspective.  -5/20 Laparoscopic assisted extended right hemicolectomy   Subjective: - Vomited large amount yesterday, diet was downgraded to full liquids again - Feels better this morning so far  Assessment and Plan:   Colon cancer Iron  deficiency anemia -Status post laparoscopic assisted right hemicolectomy 5/20, Dr. Davonna Estes - Per surgery, CT chest negative for pulmonary mets - Final path with invasive adeno CA, lymph nodes negative - Will discuss with oncology on-call - Severe iron  deficiency, continue IV iron  today - Advance diet to soft foods again, increase TBD, discharge planning, home tomorrow if stable  Acute systolic CHF  -Echocardiogram with reduced LV systolic function with EF 20 to 25% severe LVH of the lateral segment, RV preserved - Cards following, R/LHC 5/19 noted wedge of 13, no CAD - Cards following, started on Farxiga, Aldactone and metoprolol , clinically appears euvolemic  AKI (acute kidney injury) (HCC) -Resolved  Prediabetes A1c is 5.2  Class 2 obesity with body mass index (BMI) of 35 to 39.9 without comorbidity Calculated BMI is 31.7   DVT prophylaxis: SCDs Code Status: Full code Family Communication: None present Disposition Plan: Home tomorrow  Consultants:    Procedures:   Antimicrobials:    Objective: Vitals:   12/21/23 0210 12/21/23 0453 12/21/23 0721 12/21/23 1049  BP: 118/75 117/73 107/75  123/85  Pulse: (!) 116 (!) 121 (!) 106 (!) 108  Resp: 18 20 16 16   Temp: 98.9 F (37.2 C) 98.5 F (36.9 C) 98.6 F (37 C) 98.2 F (36.8 C)  TempSrc: Oral Oral Oral Oral  SpO2: 93% 95% 96% 97%  Weight:  87.6 kg    Height:        Intake/Output Summary (Last 24 hours) at 12/21/2023 1302 Last data filed at 12/21/2023 0840 Gross per 24 hour  Intake 300 ml  Output --  Net 300 ml   Filed Weights   12/19/23 0447 12/20/23 0508 12/21/23 0453  Weight: 91.1 kg 88.9 kg 87.6 kg    Examination:  General exam: Pleasant female, sitting up in recliner, AAO x 3 Respiratory system: Clear to auscultation Cardiovascular system: S1 & S2 heard, RRR.  Abd: Soft, some tenderness at the, incision sites unremarkable, diminished bowel sounds Central nervous system: Alert and oriented. No focal neurological deficits. Extremities: no edema Skin: No rashes Psychiatry:  Mood & affect appropriate.     Data Reviewed:   CBC: Recent Labs  Lab 12/17/23 1720 12/18/23 0314 12/19/23 0254 12/20/23 0302 12/21/23 0313  WBC 6.3 6.3 11.4* 7.5 8.1  NEUTROABS 4.1  --   --   --   --   HGB 9.6* 8.9* 8.2* 7.6* 9.2*  HCT 32.6* 29.9* 26.7* 25.1* 29.6*  MCV 72.4* 72.4* 74.0* 75.1* 74.0*  PLT 440* 404* 331 293 390   Basic Metabolic Panel: Recent Labs  Lab 12/14/23 1756 12/15/23 0445 12/17/23 0500 12/17/23 0910 12/17/23 0915 12/18/23 0314 12/19/23 0254 12/20/23 0302 12/21/23 0313  NA  --    < >  135   < > 138 139 138 138 139  K  --    < > 4.1   < > 3.9 4.4 4.2 3.9 3.8  CL  --    < > 105  --   --  107 109 106 103  CO2  --    < > 22  --   --  23 22 25 22   GLUCOSE  --    < > 106*  --   --  101* 123* 100* 94  BUN  --    < > 10  --   --  9 9 10 10   CREATININE  --    < > 1.28*  --   --  1.07* 1.08* 1.24* 1.07*  CALCIUM  --    < > 8.9  --   --  9.1 8.6* 8.8* 9.4  MG 1.8  --  2.0  --   --   --   --   --   --    < > = values in this interval not displayed.   GFR: Estimated Creatinine Clearance: 60 mL/min  (A) (by C-G formula based on SCr of 1.07 mg/dL (H)). Liver Function Tests: No results for input(s): "AST", "ALT", "ALKPHOS", "BILITOT", "PROT", "ALBUMIN" in the last 168 hours.  No results for input(s): "LIPASE", "AMYLASE" in the last 168 hours. No results for input(s): "AMMONIA" in the last 168 hours. Coagulation Profile: Recent Labs  Lab 12/17/23 1721  INR 1.1   Cardiac Enzymes: No results for input(s): "CKTOTAL", "CKMB", "CKMBINDEX", "TROPONINI" in the last 168 hours. BNP (last 3 results) No results for input(s): "PROBNP" in the last 8760 hours. HbA1C: No results for input(s): "HGBA1C" in the last 72 hours.  CBG: Recent Labs  Lab 12/17/23 1047  GLUCAP 84   Lipid Profile: No results for input(s): "CHOL", "HDL", "LDLCALC", "TRIG", "CHOLHDL", "LDLDIRECT" in the last 72 hours. Thyroid Function Tests: No results for input(s): "TSH", "T4TOTAL", "FREET4", "T3FREE", "THYROIDAB" in the last 72 hours. Anemia Panel: Recent Labs    12/19/23 1009  VITAMINB12 >7,500*  FOLATE 9.1  FERRITIN 101  TIBC 259  IRON  13*  RETICCTPCT 1.1   Urine analysis:    Component Value Date/Time   COLORURINE YELLOW 12/12/2023 1642   APPEARANCEUR HAZY (A) 12/12/2023 1642   LABSPEC 1.013 12/12/2023 1642   PHURINE 5.0 12/12/2023 1642   GLUCOSEU NEGATIVE 12/12/2023 1642   HGBUR NEGATIVE 12/12/2023 1642   BILIRUBINUR NEGATIVE 12/12/2023 1642   KETONESUR NEGATIVE 12/12/2023 1642   PROTEINUR 30 (A) 12/12/2023 1642   UROBILINOGEN 0.2 09/07/2014 1615   NITRITE NEGATIVE 12/12/2023 1642   LEUKOCYTESUR LARGE (A) 12/12/2023 1642   Sepsis Labs: @LABRCNTIP (procalcitonin:4,lacticidven:4)  )No results found for this or any previous visit (from the past 240 hours).   Radiology Studies: No results found.   Scheduled Meds:  sodium chloride    Intravenous Once   acetaminophen   1,000 mg Oral Q6H   bupivacaine  liposome  266 mg Infiltration Once   dapagliflozin propanediol  10 mg Oral Daily   feeding  supplement  237 mL Oral BID BM   methocarbamol  500 mg Oral QID   metoprolol  tartrate  50 mg Oral BID   multivitamin with minerals  1 tablet Oral Daily   pantoprazole   40 mg Oral BID   polyethylene glycol  17 g Oral Daily   sodium chloride  flush  3 mL Intravenous Q12H   spironolactone  12.5 mg Oral Daily   thiamine  100 mg  Oral Daily   Continuous Infusions:     LOS: 9 days    Time spent:    Deforest Fast, MD [facility]   12/21/2023, 1:02 PM

## 2023-12-21 NOTE — Progress Notes (Signed)
   Heart Failure Stewardship Pharmacist Progress Note   PCP: Collins Dean, NP PCP-Cardiologist: Sheryle Donning, MD    HPI:  63 yo F with no significant PMH.  Presented to the ED from urgent care on 5/14 after near syncopal event. Reported a few month history of gradual fatigue, weakness, shortness of breath on exertion, and dizziness. Hgb was 5.1 at urgent care and was referred to the ED for evaluation. FOBT negative. GI consulted.   Underwent endoscopy and colonoscopy on 5/16. Workup significant for transverse colon mass concerning for malignancy s/p biopsy, and gastritis. CT chest/abdomen/pelvis has been obtained, significant for hepatic flexure mass with surrounding mesenteric lymphadenopathy, otherwise no evidence of metastasis. General surgery was consulted plans for hemicolectomy.   Had 12 beats of NSVT on 5/16. ECHO obtained on 5/18 and EF 20-25%, global hypokinesis, G1DD, severe asymmetric LVH, RV normal. Cardiology consulted. Underwent R/LHC on 5/19 and no angiographically evidence of CAD. RA 4, PA 24, wedge 9, CO/CI 7.9/3.6.  Given cath findings, cleared for R hemicolectomy, completed on 5/20.   Denies shortness of breath. No LE edema. Denies lightheadedness or dizziness. Has been up walking and feels like she is getting stronger. Agreeable to using [facility] TOC pharmacy on discharge and is interested in free mailing service. Will also discuss with her daughter to make sure she is ok with this too. Reviewed current GDMT and HF disease state management. She has a scale and pill box at home. HF TOC appt made for 5/30.   Current HF Medications: Beta Blocker: metoprolol  tartrate 37.5 mg BID MRA: spironolactone 12.5 mg daily SGLT2i: Farxiga 10 mg daily  Prior to admission HF Medications: None  Pertinent Lab Values: Serum creatinine 1.07, BUN 10, Potassium 3.8, Sodium 139, Magnesium  2.0, A1c 5.2   Vital Signs: Weight: 193 lbs (admission weight: 195 lbs) Blood pressure:  110/70s  Heart rate: 110s  I/O: incomplete  Medication Assistance / Insurance Benefits Check: Does the patient have prescription insurance?  Yes Type of insurance plan: [place] Medicaid  Outpatient Pharmacy:  Prior to admission outpatient pharmacy: [facility] Is the patient willing to use [facility] TOC pharmacy at discharge? Yes Is the patient willing to transition their outpatient pharmacy to utilize a [facility] outpatient pharmacy?   Yes, interested in mail order    Assessment: 1. Acute systolic CHF (LVEF 20-25%), due to NICM. NYHA class II symptoms. - Does not appear volume overloaded on exam - On metoprolol  tartrate 37.5 mg BID, remains tachycardic. Will need to be transitioned to metoprolol  XL prior to discharge. - Consider starting losartan 25 mg daily - Continue spironolactone 12.5 mg daily - Continue Farxiga 10 mg daily   Plan: 1) Medication changes recommended at this time: - Start losartan 25 mg daily vs increase BB  2) Patient assistance: - Entresto copay $4 - Farxiga/Jardiance require prior authorization - Farxiga auth approved - copay $4 - HF TOC appt scheduled for 5/30 - Has pill box and scale at home  3)  Education  - Patient has been educated on current HF medications and potential additions to HF medication regimen - Patient verbalizes understanding that over the next few months, these medication doses may change and more medications may be added to optimize HF regimen - Patient has been educated on basic disease state pathophysiology and goals of therapy   Jerilyn Monte, PharmD, BCPS Heart Failure Stewardship Pharmacist Phone [phone]

## 2023-12-21 NOTE — Progress Notes (Signed)
 Rounding Note    Patient Name: Leah Hernandez Date of Encounter: 12/21/2023  [facility] Cardiologist: Sheryle Donning, MD   Subjective   Post op day 3 from her hemicolectomy. Hgb better. Improving energy, has had bowel movement. Reviewed plans for her medication management, see below.  Inpatient Medications    Scheduled Meds:  sodium chloride    Intravenous Once   acetaminophen   1,000 mg Oral Q6H   bupivacaine  liposome  266 mg Infiltration Once   dapagliflozin propanediol  10 mg Oral Daily   feeding supplement  237 mL Oral BID BM   methocarbamol  500 mg Oral QID   metoprolol  tartrate  37.5 mg Oral BID   multivitamin with minerals  1 tablet Oral Daily   pantoprazole   40 mg Oral BID   polyethylene glycol  17 g Oral Daily   sodium chloride  flush  3 mL Intravenous Q12H   spironolactone  12.5 mg Oral Daily   thiamine  100 mg Oral Daily   Continuous Infusions:    PRN Meds: HYDROmorphone (DILAUDID) injection, melatonin, ondansetron  **OR** ondansetron  (ZOFRAN ) IV, oxyCODONE , sodium chloride  flush   Vital Signs    Vitals:   12/20/23 1928 12/21/23 0210 12/21/23 0453 12/21/23 0721  BP: 121/81 118/75 117/73 107/75  Pulse: (!) 109 (!) 116 (!) 121 (!) 106  Resp: 18 18 20 16   Temp: 98.5 F (36.9 C) 98.9 F (37.2 C) 98.5 F (36.9 C) 98.6 F (37 C)  TempSrc: Oral Oral Oral Oral  SpO2: 93% 93% 95% 96%  Weight:   87.6 kg   Height:        Intake/Output Summary (Last 24 hours) at 12/21/2023 1001 Last data filed at 12/21/2023 0840 Gross per 24 hour  Intake 660 ml  Output --  Net 660 ml      12/21/2023    4:53 AM 12/20/2023    5:08 AM 12/19/2023    4:47 AM  Last 3 Weights  Weight (lbs) 193 lb 2 oz 195 lb 14.4 oz 200 lb 12.5 oz  Weight (kg) 87.6 kg 88.86 kg 91.074 kg      Telemetry    SR - Personally Reviewed  Physical Exam   GEN: Well nourished, well developed in no acute distress NECK: No JVD CARDIAC: regular rhythm, normal S1 and S2, no rubs  or gallops. No murmur. VASCULAR: Radial pulses 2+ bilaterally.  RESPIRATORY:  Clear to auscultation without rales, wheezing or rhonchi  ABDOMEN: Soft, mildly tender, non-distended MUSCULOSKELETAL:  Moves all 4 limbs independently SKIN: Warm and dry, no edema NEUROLOGIC:  No focal neuro deficits noted. PSYCHIATRIC:  Normal affect    New pertinent results (labs, ECG, imaging, cardiac studies)    Echo, cath images personally reviewed.  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 RHC with normal CO/CI.  -was started on spironolactone this admission. Cr back to baseline today, K stable -with continued tachycardia, titrating metoprolol . Went to 37.5 mg BID yesterday, will increase to 50 mg BID today and consolidate to 100 mg metoprolol  succinate daily tomorrow -if BP allows, would start low dose ARB as outpatient -started farxiga this admission, $4 copay -suspect she will be able to use lasix only PRN at home, discussed monitoring symptoms and daily weights today -I/O and weights inaccurate. Admission weight listed as  106.9 kg on 5/14, but then weight 5/19 was 88.6 kg, which is more consistent. Peak weight during admission 91.1 kg, now downtrending to 87.6 kg. Output not well documented so cannot rely on I/O  NSVT -on metoprolol , consolidate to succinate prior to discharge (ok to leave tartrate perioperatively). Uptitrating today as above.  Anemia, severe, requiring transfusion Lower GI bleed Colon cancer -now s/p hemicolectomy 12/18/23, pathology shows invasive adenocarcinoma, 9.5 cm, clean margins. No lymphovascular invasion but perineural invasion noted, 28 lymph nodes negative. -has required transfusion this admission. Hgb was 7.6 yesterday but up to 9.2 today without transfusion. Has  received IV iron   Anticipate discharge in near future. Cardiology will sign off. She has appt with the heart failure impact clinic on 12/28/23 for follow up.  Discharge medications: Dapagliflozin 10 mg daily Metoprolol  succinate 100 mg daily Spironolactone 12.5 mg daily Furosemide 40 gm daily PRN weight gain more than 3 lbs overnight or 5 lbs in a week  Signed, Sheryle Donning, MD  12/21/2023, 10:01 AM

## 2023-12-21 NOTE — Progress Notes (Signed)
 Brief Oncology Note: Patient will be seen outpatient oncology office at [facility] by Delvin File NP and Dr. Maryalice Smaller on 12/26/23 at 10am.   Dx:  Invasive Adenocarcinoma, colon, moderately differentiated - newly diagnosed.

## 2023-12-21 NOTE — Plan of Care (Signed)
  Problem: Education: Goal: Knowledge of General Education information will improve Description: Including pain rating scale, medication(s)/side effects and non-pharmacologic comfort measures Outcome: Progressing   Problem: Health Behavior/Discharge Planning: Goal: Ability to manage health-related needs will improve Outcome: Progressing   Problem: Clinical Measurements: Goal: Ability to maintain clinical measurements within normal limits will improve Outcome: Progressing Goal: Will remain free from infection Outcome: Progressing Goal: Diagnostic test results will improve Outcome: Progressing Goal: Respiratory complications will improve Outcome: Progressing Goal: Cardiovascular complication will be avoided Outcome: Progressing   Problem: Activity: Goal: Risk for activity intolerance will decrease Outcome: Progressing   Problem: Nutrition: Goal: Adequate nutrition will be maintained Outcome: Progressing   Problem: Coping: Goal: Level of anxiety will decrease Outcome: Progressing   Problem: Elimination: Goal: Will not experience complications related to bowel motility Outcome: Progressing Goal: Will not experience complications related to urinary retention Outcome: Progressing   Problem: Pain Managment: Goal: General experience of comfort will improve and/or be controlled Outcome: Progressing   Problem: Safety: Goal: Ability to remain free from injury will improve Outcome: Progressing   Problem: Skin Integrity: Goal: Risk for impaired skin integrity will decrease Outcome: Progressing   Problem: Education: Goal: Understanding of discharge needs will improve Outcome: Progressing Goal: Verbalization of understanding of the causes of altered bowel function will improve Outcome: Progressing   Problem: Activity: Goal: Ability to tolerate increased activity will improve Outcome: Progressing   Problem: Bowel/Gastric: Goal: Gastrointestinal status for postoperative  course will improve Outcome: Progressing   Problem: Health Behavior/Discharge Planning: Goal: Identification of community resources to assist with postoperative recovery needs will improve Outcome: Progressing   Problem: Nutritional: Goal: Will attain and maintain optimal nutritional status will improve Outcome: Progressing   Problem: Clinical Measurements: Goal: Postoperative complications will be avoided or minimized Outcome: Progressing   Problem: Respiratory: Goal: Respiratory status will improve Outcome: Progressing   Problem: Skin Integrity: Goal: Will show signs of wound healing Outcome: Progressing   Problem: Education: Goal: Understanding of CV disease, CV risk reduction, and recovery process will improve Outcome: Progressing Goal: Individualized Educational Video(s) Outcome: Progressing   Problem: Activity: Goal: Ability to return to baseline activity level will improve Outcome: Progressing   Problem: Cardiovascular: Goal: Ability to achieve and maintain adequate cardiovascular perfusion will improve Outcome: Progressing Goal: Vascular access site(s) Level 0-1 will be maintained Outcome: Progressing   Problem: Health Behavior/Discharge Planning: Goal: Ability to safely manage health-related needs after discharge will improve Outcome: Progressing

## 2023-12-21 NOTE — Plan of Care (Signed)

## 2023-12-22 ENCOUNTER — Other Ambulatory Visit (HOSPITAL_COMMUNITY): Payer: Self-pay

## 2023-12-22 DIAGNOSIS — C189 Malignant neoplasm of colon, unspecified: Secondary | ICD-10-CM

## 2023-12-22 DIAGNOSIS — D649 Anemia, unspecified: Secondary | ICD-10-CM | POA: Diagnosis not present

## 2023-12-22 DIAGNOSIS — I428 Other cardiomyopathies: Secondary | ICD-10-CM | POA: Diagnosis not present

## 2023-12-22 DIAGNOSIS — I5021 Acute systolic (congestive) heart failure: Secondary | ICD-10-CM | POA: Diagnosis not present

## 2023-12-22 LAB — BASIC METABOLIC PANEL WITH GFR
Anion gap: 7 (ref 5–15)
BUN: 14 mg/dL (ref 8–23)
CO2: 25 mmol/L (ref 22–32)
Calcium: 8.9 mg/dL (ref 8.9–10.3)
Chloride: 105 mmol/L (ref 98–111)
Creatinine, Ser: 1.25 mg/dL — ABNORMAL HIGH (ref 0.44–1.00)
GFR, Estimated: 48 mL/min — ABNORMAL LOW (ref >60)
Glucose, Bld: 93 mg/dL (ref 70–99)
Potassium: 3.8 mmol/L (ref 3.5–5.1)
Sodium: 137 mmol/L (ref 135–145)

## 2023-12-22 LAB — CBC
HCT: 27.4 % — ABNORMAL LOW (ref 36.0–46.0)
Hemoglobin: 8.3 g/dL — ABNORMAL LOW (ref 12.0–15.0)
MCH: 23 pg — ABNORMAL LOW (ref 26.0–34.0)
MCHC: 30.3 g/dL (ref 30.0–36.0)
MCV: 75.9 fL — ABNORMAL LOW (ref 80.0–100.0)
Platelets: 391 10*3/uL (ref 150–400)
RBC: 3.61 MIL/uL — ABNORMAL LOW (ref 3.87–5.11)
WBC: 6.5 10*3/uL (ref 4.0–10.5)
nRBC: 0.3 % — ABNORMAL HIGH (ref 0.0–0.2)

## 2023-12-22 MED ORDER — POLYETHYLENE GLYCOL 3350 17 GM/SCOOP PO POWD
17.0000 g | Freq: Every day | ORAL | 0 refills | Status: AC
  Filled 2023-12-22: qty 238, 14d supply, fill #0

## 2023-12-22 MED ORDER — METHOCARBAMOL 500 MG PO TABS
500.0000 mg | ORAL_TABLET | Freq: Three times a day (TID) | ORAL | 0 refills | Status: AC
  Filled 2023-12-22: qty 9, 3d supply, fill #0

## 2023-12-22 MED ORDER — DAPAGLIFLOZIN PROPANEDIOL 10 MG PO TABS
10.0000 mg | ORAL_TABLET | Freq: Every day | ORAL | 1 refills | Status: DC
  Filled 2023-12-22: qty 30, 30d supply, fill #0

## 2023-12-22 MED ORDER — OXYCODONE HCL 5 MG PO TABS
5.0000 mg | ORAL_TABLET | Freq: Four times a day (QID) | ORAL | 0 refills | Status: DC | PRN
Start: 2023-12-22 — End: 2024-01-18
  Filled 2023-12-22: qty 30, 7d supply, fill #0

## 2023-12-22 MED ORDER — SPIRONOLACTONE 25 MG PO TABS
12.5000 mg | ORAL_TABLET | Freq: Every day | ORAL | 1 refills | Status: DC
  Filled 2023-12-22: qty 30, 60d supply, fill #0

## 2023-12-22 MED ORDER — METOPROLOL SUCCINATE ER 100 MG PO TB24
100.0000 mg | ORAL_TABLET | Freq: Every day | ORAL | 1 refills | Status: DC
  Filled 2023-12-22: qty 30, 30d supply, fill #0

## 2023-12-22 NOTE — Progress Notes (Signed)
 PROGRESS NOTE    Leah Hernandez  WGN:[id number] DOB: 05-31-1961 DOA: 12/12/2023 PCP: Collins Dean, NP  63/F with no significant PMH presented to the ED after a syncopal episode, history of fatigue for few months and dyspnea on exertion Initial labs showed hemoglobin of 5.1.,  Hemoccult was negative, transfused PRBC  -> Colonoscopy significant for colon mass s/p biopsy, as well noted to have NSVT's, workup significant for low EF 20 to 25% , improved with diuresis, now euvolemic, no CAD on LHC Patient was optimized from cardiovascular perspective.  -5/20 Laparoscopic assisted extended right hemicolectomy   Subjective: - Vomited overnight, anxious to eat, some nausea had BMs yesterday  Assessment and Plan:   Colon cancer Iron  deficiency anemia -Status post laparoscopic assisted right hemicolectomy 5/20, Dr. Davonna Estes - Per surgery, CT chest negative for pulmonary mets - Final path with invasive adeno CA, lymph nodes negative - Discussed with oncology, follow-up made with Dr. Maryalice Smaller for 5/28 at 2 PM - Severe iron  deficiency, given IV iron  - Home later today if she tolerates diet  Acute systolic CHF  -Echocardiogram with reduced LV systolic function with EF 20 to 25% severe LVH of the lateral segment, RV preserved - Cards following, [facility] 5/19 noted wedge of 13, no CAD - Cards following, started on Farxiga, Aldactone and metoprolol , clinically appears euvolemic - Add Lasix as needed at DC  AKI (acute kidney injury) (HCC) -Resolved  Prediabetes A1c is 5.2  Class 2 obesity with body mass index (BMI) of 35 to 39.9 without comorbidity Calculated BMI is 31.7   DVT prophylaxis: SCDs Code Status: Full code Family Communication: None present Disposition Plan: Home later today or in a.m.  Consultants:    Procedures:   Antimicrobials:    Objective: Vitals:   12/21/23 2109 12/22/23 0030 12/22/23 0520 12/22/23 0758  BP: 120/82 (!) 119/55 104/69 113/69  Pulse: (!) 114 98  95   Resp:    20  Temp:  98.1 F (36.7 C) 98.6 F (37 C) 98.5 F (36.9 C)  TempSrc:  Oral Oral Oral  SpO2:  96% 99%   Weight:      Height:        Intake/Output Summary (Last 24 hours) at 12/22/2023 1101 Last data filed at 12/21/2023 2106 Gross per 24 hour  Intake 260 ml  Output --  Net 260 ml   Filed Weights   12/19/23 0447 12/20/23 0508 12/21/23 0453  Weight: 91.1 kg 88.9 kg 87.6 kg    Examination:  General exam: Pleasant female, sitting up in recliner, AAO x 3 Respiratory system: Clear Cardiovascular system: S1-S2, regular rhythm Abd: Soft, minimal tenderness at the, incision sites unremarkable, diminished bowel sounds Central nervous system: Alert and oriented. No focal neurological deficits. Extremities: no edema Skin: No rashes Psychiatry:  Mood & affect appropriate.     Data Reviewed:   CBC: Recent Labs  Lab 12/17/23 1720 12/18/23 0314 12/19/23 0254 12/20/23 0302 12/21/23 0313 12/22/23 0304  WBC 6.3 6.3 11.4* 7.5 8.1 6.5  NEUTROABS 4.1  --   --   --   --   --   HGB 9.6* 8.9* 8.2* 7.6* 9.2* 8.3*  HCT 32.6* 29.9* 26.7* 25.1* 29.6* 27.4*  MCV 72.4* 72.4* 74.0* 75.1* 74.0* 75.9*  PLT 440* 404* 331 293 390 391   Basic Metabolic Panel: Recent Labs  Lab 12/17/23 0500 12/17/23 0910 12/18/23 0314 12/19/23 0254 12/20/23 0302 12/21/23 0313 12/22/23 0304  NA 135   < > 139 138 138 139  137  K 4.1   < > 4.4 4.2 3.9 3.8 3.8  CL 105  --  107 109 106 103 105  CO2 22  --  23 22 25 22 25   GLUCOSE 106*  --  101* 123* 100* 94 93  BUN 10  --  9 9 10 10 14   CREATININE 1.28*  --  1.07* 1.08* 1.24* 1.07* 1.25*  CALCIUM 8.9  --  9.1 8.6* 8.8* 9.4 8.9  MG 2.0  --   --   --   --   --   --    < > = values in this interval not displayed.   GFR: Estimated Creatinine Clearance: 51.3 mL/min (A) (by C-G formula based on SCr of 1.25 mg/dL (H)). Liver Function Tests: No results for input(s): "AST", "ALT", "ALKPHOS", "BILITOT", "PROT", "ALBUMIN" in the last 168  hours.  No results for input(s): "LIPASE", "AMYLASE" in the last 168 hours. No results for input(s): "AMMONIA" in the last 168 hours. Coagulation Profile: Recent Labs  Lab 12/17/23 1721  INR 1.1   Cardiac Enzymes: No results for input(s): "CKTOTAL", "CKMB", "CKMBINDEX", "TROPONINI" in the last 168 hours. BNP (last 3 results) No results for input(s): "PROBNP" in the last 8760 hours. HbA1C: No results for input(s): "HGBA1C" in the last 72 hours.  CBG: Recent Labs  Lab 12/17/23 1047  GLUCAP 84   Lipid Profile: No results for input(s): "CHOL", "HDL", "LDLCALC", "TRIG", "CHOLHDL", "LDLDIRECT" in the last 72 hours. Thyroid Function Tests: No results for input(s): "TSH", "T4TOTAL", "FREET4", "T3FREE", "THYROIDAB" in the last 72 hours. Anemia Panel: No results for input(s): "VITAMINB12", "FOLATE", "FERRITIN", "TIBC", "IRON ", "RETICCTPCT" in the last 72 hours.  Urine analysis:    Component Value Date/Time   COLORURINE YELLOW 12/12/2023 1642   APPEARANCEUR HAZY (A) 12/12/2023 1642   LABSPEC 1.013 12/12/2023 1642   PHURINE 5.0 12/12/2023 1642   GLUCOSEU NEGATIVE 12/12/2023 1642   HGBUR NEGATIVE 12/12/2023 1642   BILIRUBINUR NEGATIVE 12/12/2023 1642   KETONESUR NEGATIVE 12/12/2023 1642   PROTEINUR 30 (A) 12/12/2023 1642   UROBILINOGEN 0.2 09/07/2014 1615   NITRITE NEGATIVE 12/12/2023 1642   LEUKOCYTESUR LARGE (A) 12/12/2023 1642   Sepsis Labs: @LABRCNTIP (procalcitonin:4,lacticidven:4)  )No results found for this or any previous visit (from the past 240 hours).   Radiology Studies: No results found.   Scheduled Meds:  sodium chloride    Intravenous Once   acetaminophen   1,000 mg Oral Q6H   bupivacaine  liposome  266 mg Infiltration Once   dapagliflozin propanediol  10 mg Oral Daily   feeding supplement  237 mL Oral BID BM   methocarbamol  500 mg Oral QID   metoprolol  succinate  100 mg Oral Daily   multivitamin with minerals  1 tablet Oral Daily   pantoprazole   40 mg  Oral BID   polyethylene glycol  17 g Oral Daily   sodium chloride  flush  3 mL Intravenous Q12H   spironolactone  12.5 mg Oral Daily   thiamine  100 mg Oral Daily   Continuous Infusions:     LOS: 10 days    Time spent:    Deforest Fast, MD [facility]   12/22/2023, 11:01 AM

## 2023-12-22 NOTE — Progress Notes (Signed)
 [facility] Progress Note  4 Days Post-Op  Subjective: CC:  Had some GERD last night, which she reports is baseline for her. Denies bloating. Tolerating PO and passing flatus.   Objective: Vital signs in last 24 hours: Temp:  [98.1 F (36.7 C)-98.6 F (37 C)] 98.5 F (36.9 C) (05/24 0758) Pulse Rate:  [95-114] 95 (05/24 0520) Resp:  [16-20] 20 (05/24 0758) BP: (104-123)/(55-85) 113/69 (05/24 0758) SpO2:  [96 %-99 %] 99 % (05/24 0520) Last BM Date : 12/20/23  Intake/Output from previous day: 05/23 0701 - 05/24 0700 In: 320 [P.O.:320] Out: -  Intake/Output this shift: No intake/output data recorded.  PE: Gen:  Alert, NAD, pleasant Card:  Regular rate and rhythm Pulm:  Normal effort ORA Abd: Soft, appropriately tender, incisions c/d/I with subcuticular sutures/dermabond and no cellulitis or drainage Skin: warm and dry, no rashes  Psych: A&Ox3   Lab Results:  Recent Labs    12/21/23 0313 12/22/23 0304  WBC 8.1 6.5  HGB 9.2* 8.3*  HCT 29.6* 27.4*  PLT 390 391   BMET Recent Labs    12/21/23 0313 12/22/23 0304  NA 139 137  K 3.8 3.8  CL 103 105  CO2 22 25  GLUCOSE 94 93  BUN 10 14  CREATININE 1.07* 1.25*  CALCIUM 9.4 8.9   PT/INR No results for input(s): "LABPROT", "INR" in the last 72 hours.  CMP     Component Value Date/Time   NA 137 12/22/2023 0304   NA 140 09/29/2019 1449   K 3.8 12/22/2023 0304   CL 105 12/22/2023 0304   CO2 25 12/22/2023 0304   GLUCOSE 93 12/22/2023 0304   BUN 14 12/22/2023 0304   BUN 17 09/29/2019 1449   CREATININE 1.25 (H) 12/22/2023 0304   CALCIUM 8.9 12/22/2023 0304   PROT 6.8 12/12/2023 2052   PROT 7.6 09/29/2019 1449   ALBUMIN 2.8 (L) 12/12/2023 2052   ALBUMIN 4.6 09/29/2019 1449   AST 21 12/12/2023 2052   ALT 11 12/12/2023 2052   ALKPHOS 82 12/12/2023 2052   BILITOT 0.5 12/12/2023 2052   BILITOT 0.3 09/29/2019 1449   GFRNONAA 48 (L) 12/22/2023 0304   GFRAA 70 09/29/2019 1449   Lipase  No  results found for: "LIPASE"     Studies/Results: No results found.   Anti-infectives: Anti-infectives (From admission, onward)    Start     Dose/Rate Route Frequency Ordered Stop   12/18/23 0815  cefoTEtan  (CEFOTAN ) 2 g in sodium chloride  0.9 % 100 mL IVPB        2 g 200 mL/hr over 30 Minutes Intravenous On call to O.R. 12/18/23 1610 12/18/23 0939   12/18/23 0600  cefoTEtan  (CEFOTAN ) 2 g in sodium chloride  0.9 % 100 mL IVPB  Status:  Discontinued        2 g 200 mL/hr over 30 Minutes Intravenous On call to O.R. 12/17/23 1624 12/18/23 0810   12/17/23 2200  neomycin  (MYCIFRADIN ) tablet 1,000 mg  Status:  Discontinued       Placed in "And" Linked Group   1,000 mg Oral 3 times per day 12/17/23 1624 12/18/23 1413   12/17/23 2200  metroNIDAZOLE  (FLAGYL ) tablet 1,000 mg  Status:  Discontinued       Placed in "And" Linked Group   1,000 mg Oral 3 times per day 12/17/23 1624 12/18/23 1413   12/17/23 0600  cefoTEtan  (CEFOTAN ) 2 g in sodium chloride  0.9 % 100 mL IVPB  Status:  Discontinued  2 g 200 mL/hr over 30 Minutes Intravenous On call to O.R. 12/16/23 1421 12/17/23 1427   12/16/23 1515  neomycin  (MYCIFRADIN ) tablet 1,000 mg  Status:  Discontinued       Placed in "And" Linked Group   1,000 mg Oral 3 times per day 12/16/23 1421 12/17/23 1427   12/16/23 1515  metroNIDAZOLE  (FLAGYL ) tablet 1,000 mg  Status:  Discontinued       Placed in "And" Linked Group   1,000 mg Oral 3 times per day 12/16/23 1421 12/17/23 1427        Assessment/Plan Transverse colon cancer POD#4 s/p laparoscopic assisted extended right hemicolectomy 5/20 Dr. Davonna Estes  - Okay for discharge from surgery perspective  - multimodal pain control: tylenol  1000 mg q 6h, robaxin 500 mg QID, oxycodone  5 mg q6h PRN, dilaudid for breakthrough - Oncology planning outpatient follow up - OOB today, IS  FEN: regular diet ID: perioperative cefotan  given VTE: SCD's, Lovenox  Foley: removed Dispo: TRH service   ABL/IDA  anemia - due to above Acute systolic heart failure w reduced EF Prediabetes AKI  Obesity    LOS: 10 days   I reviewed nursing notes, hospitalist notes, last 24 h vitals and pain scores, last 48 h intake and output, last 24 h labs and trends, and last 24 h imaging results.  This care required straight-forward level of medical decision making.   Armond Bertin, MD [facility] Please see Amion for pager number during day hours 7:00am-4:30pm

## 2023-12-22 NOTE — Progress Notes (Signed)
 Mobility Specialist Progress Note:   12/22/23 1100  Mobility  Activity Ambulated with assistance in hallway  Level of Assistance Standby assist, set-up cues, supervision of patient - no hands on  Assistive Device None  Distance Ambulated (ft) 500 ft  Activity Response Tolerated well  Mobility Referral Yes  Mobility visit 1 Mobility  Mobility Specialist Start Time (ACUTE ONLY) 1100  Mobility Specialist Stop Time (ACUTE ONLY) 1115  Mobility Specialist Time Calculation (min) (ACUTE ONLY) 15 min   Pt agreeable to mobility session. Required no physical assistance throughout ambulation. No c/o throughout. Back sitting EOB with all needs met.   Oneda Big Mobility Specialist Please contact via SecureChat or  Rehab office at [phone]

## 2023-12-22 NOTE — Discharge Summary (Signed)
 Physician Discharge Summary   Patient: Leah Hernandez MRN: [id number] DOB: 1960/10/29  Admit date:     12/12/2023  Discharge date: {dischdate:26783}  Discharge Physician: Leah Hernandez   PCP: Leah Dean, NP   Recommendations at discharge:  {Tip this will not be part of the note when signed- Example include specific recommendations for outpatient follow-up, pending tests to follow-up on. 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 [facility] Course: No notes on file  Assessment and Plan: * Iron  deficiency anemia due to colon cancer Symptomatic anemia.  Hgb today is 8,9 Continue close follow up cell count, keep hgb 8 or greater.   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  Continue pos op care per surgery recommendations   Acute systolic CHF (congestive heart failure) (HCC) Echocardiogram with reduced LV systolic function with EF 20 to 25% with global hypokinesis, moderate to severe dilation of LV cavity, severe LVH of the lateral segment, RV systolic function preserved, no significant valvular disease.   05/19 cardiac cardiac catheterization  RA 7  RV 35/8 mmHg  PA 34/16 mean 22  PCWP 13  Cardiac output 7.9 index 3.6   Systolic blood pressure in the 100 mmHg range  Continue with metoprolol .  Will start with guideline directed medical therapy in the next 24 hrs.   Non sustained VT. Continue telemetry monitoring keep K at 4 and Mg at 2/.   AKI  (acute kidney injury) (HCC) Renal function with serum cr at 1,0 with K at 4,4 and serum bicarbonate at 23  Na 139   Plan to continue close monitoring renal function and electrolytes.  Holding on diuretic therapy for now.   Prediabetes Fasting glucose this am 101 mg.dl   Class 2 obesity with body mass index (BMI) of 35 to 39.9 without comorbidity Calculated BMI is 31.7       {Tip this will not be part of the note when signed Body mass index is 31.17 kg/m. ,  Nutrition Documentation    Flowsheet Row ED to Hosp-Admission (Current) from 12/12/2023 in [facility] 3E HF PCU  Nutrition Problem Severe Malnutrition  Etiology chronic illness  Nutrition Goal Patient will meet greater than or equal to 90% of their needs     ,  (Optional):26781}  {(NOTE) Pain control PDMP Statment (Optional):26782} Consultants: *** Procedures performed: ***  Disposition: {Plan; Disposition:26390} Diet recommendation:  Discharge Diet Orders (From admission, onward)     Start     Ordered   12/22/23 0000  Diet - low sodium heart healthy        12/22/23 1540           {Diet_Plan:26776} DISCHARGE MEDICATION: Allergies as of 12/22/2023   No Known Allergies      Medication List     TAKE these medications    acetaminophen  325 MG tablet Commonly known as: TYLENOL  Take 325-650 mg by mouth every 6 (six) hours as needed for mild pain (pain score 1-3)  or moderate pain (pain score 4-6).   dapagliflozin  propanediol 10 MG Tabs tablet Commonly known as: FARXIGA  Take 1 tablet (10 mg total) by mouth daily. Start taking on: Dec 23, 2023   methocarbamol  500 MG tablet Commonly known as: ROBAXIN  Take 1 tablet (500 mg total) by mouth 3 (three) times daily for 3 days.   metoprolol  succinate 100 MG 24 hr tablet Commonly known as: TOPROL -XL Take 1 tablet (100 mg total) by mouth daily. Take with or immediately following a meal. Start taking on: Dec 23, 2023   oxyCODONE  5 MG immediate release  tablet Commonly known as: Oxy IR/ROXICODONE  Take 1 tablet (5 mg total) by mouth every 6 (six) hours as needed for severe pain (pain score 7-10).   polyethylene glycol 17 g packet Commonly known as: MIRALAX  / GLYCOLAX  Take 17 g by mouth daily for 7 days. Start taking on: Dec 23, 2023   spironolactone  25 MG tablet Commonly known as: ALDACTONE  Take 0.5 tablets (12.5 mg total) by mouth daily. Start taking on: Dec 23, 2023        Follow-up Information     Leah Champion, MD Follow up on 01/07/2024.   Specialty: General Surgery Why: at 2:15 PM. Please arrive at 1:45 PM to get checked in and fill out any necessary paperwork. Contact information: [address] [place] [address] [phone]         Leah [place], MD Follow up on 12/26/2023.   Specialties: Hematology, Oncology Why: at [facility] information: [address] [place] [address] [phone]                Discharge Exam: Leah Hernandez Weights   12/19/23 0447 12/20/23 0508 12/21/23 0453  Weight: 91.1 kg 88.9 kg 87.6 kg   ***  Condition at discharge: {DC Condition:26389}  The results of significant diagnostics from this hospitalization (including imaging, microbiology, ancillary and laboratory) are listed below for reference.   Imaging Studies: CARDIAC CATHETERIZATION Result Date: 12/17/2023 Conclusions: No angiographically significant coronary artery disease.  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 Normal left heart filling pressures. Mildly elevated right heart and pulmonary artery pressures. Normal Fick cardiac output/index. Recommendations: Maintain net even fluid balance. Escalate goal-directed medical therapy for management of non-ischemic cardiomyopathy. Leah Crisp, MD [facility]  ECHOCARDIOGRAM COMPLETE Result Date: 12/16/2023    ECHOCARDIOGRAM REPORT   Patient Name:   Leah Hernandez Date of Exam: 12/16/2023 Medical Rec  #:  [id number]         Height:       67.0 in Accession #:    [id number]        Weight:       235.7 lb Date of Birth:  1961/04/05         BSA:          2.168 m Patient Age:    63 years          BP:           111/63 mmHg Patient Gender: F                 HR:           97 bpm. Exam Location:  Inpatient Procedure: 2D Echo, 3D Echo, Cardiac Doppler, Color Doppler and Strain Analysis            (Both Spectral and Color Flow Doppler were utilized during  procedure). Indications:    NSVT (nonsustained ventricular tachycardia)  History:        Patient has no prior history of Echocardiogram examinations.                 Risk Factors:Prediabetes.  Sonographer:    Travis Friedman RDCS Referring Phys: 52 DAWOOD S ELGERGAWY IMPRESSIONS  1. Left ventricular ejection fraction, by estimation, is 20 to 25%. Left ventricular ejection fraction by 3D volume is 38 %. The left ventricle has severely decreased function. The left ventricle demonstrates global hypokinesis. The left ventricular internal cavity size was moderately to severely dilated. There is severe asymmetric left ventricular hypertrophy of the lateral segment. Left ventricular diastolic parameters are consistent with Grade I diastolic dysfunction (impaired relaxation). The average left ventricular global longitudinal strain is -9.4 %. The global longitudinal strain is abnormal.  2. Right ventricular systolic function is normal. The right ventricular size is normal. There is normal pulmonary artery systolic pressure.  3. The mitral valve is normal in structure. No evidence of mitral valve regurgitation. No evidence of mitral stenosis.  4. The aortic valve is tricuspid. Aortic valve regurgitation is not visualized. No aortic stenosis is present.  5. The inferior vena cava is normal in size with greater than 50% respiratory variability, suggesting right atrial pressure of 3 mmHg. Comparison(s): No prior Echocardiogram. FINDINGS  Left Ventricle: Left ventricular ejection  fraction, by estimation, is 20 to 25%. Left ventricular ejection fraction by 3D volume is 38 %. The left ventricle has severely decreased function. The left ventricle demonstrates global hypokinesis. The average  left ventricular global longitudinal strain is -9.4 %. Strain was performed and the global longitudinal strain is abnormal. The left ventricular internal cavity size was moderately to severely dilated. There is severe asymmetric left ventricular hypertrophy of the lateral segment. Left ventricular diastolic parameters are consistent with Grade I diastolic dysfunction (impaired relaxation). Right Ventricle: The right ventricular size is normal. No increase in right ventricular wall thickness. Right ventricular systolic function is normal. There is normal pulmonary artery systolic pressure. The tricuspid regurgitant velocity is 2.48 m/s, and  with an assumed right atrial pressure of 3 mmHg, the estimated right ventricular systolic pressure is 27.6 mmHg. Left Atrium: Left atrial size was normal in size. Right Atrium: Right atrial size was normal in size. Pericardium: There is no evidence of pericardial effusion. Mitral Valve: The mitral valve is normal in structure. No evidence of mitral valve regurgitation. No evidence of mitral valve stenosis. Tricuspid Valve: The tricuspid valve is normal in structure. Tricuspid valve regurgitation is mild . No evidence of tricuspid stenosis. Aortic Valve: The aortic valve is tricuspid. Aortic valve regurgitation is not visualized. No aortic stenosis is present. Aortic valve mean gradient measures 3.3 mmHg. Aortic valve peak gradient measures 6.5 mmHg. Aortic valve area, by VTI measures 2.97 cm. Pulmonic Valve: The pulmonic valve was not well visualized. Pulmonic valve regurgitation is not visualized. No evidence of pulmonic stenosis. Aorta: The aortic root is normal in size and structure. Venous: The inferior vena cava is normal in size with greater than 50% respiratory  variability, suggesting right atrial pressure of 3 mmHg. IAS/Shunts: No atrial level shunt detected by color flow Doppler. Additional Comments: 3D was performed not requiring image post processing on an independent workstation and was indeterminate.  LEFT VENTRICLE PLAX 2D LVIDd:         6.20 cm         Diastology LVIDs:  5.10 cm         LV e' medial:    8.92 cm/s LV PW:         1.50 cm         LV E/e' medial:  11.8 LV IVS:        0.70 cm         LV e' lateral:   8.38 cm/s LVOT diam:     2.50 cm         LV E/e' lateral: 12.5 LV SV:         68 LV SV Index:   31              2D Longitudinal LVOT Area:     4.91 cm        Strain                                2D Strain GLS   -9.4 %                                Avg:                                 3D Volume EF                                LV 3D EF:    Left                                             ventricul                                             ar                                             ejection                                             fraction                                             by 3D                                             volume is                                             38 %.  3D Volume EF:                                3D EF:        38 %                                LV EDV:       157 ml                                LV ESV:       98 ml                                LV SV:        59 ml RIGHT VENTRICLE             IVC RV Basal diam:  3.20 cm     IVC diam: 1.70 cm RV S prime:     13.50 cm/s TAPSE (M-mode): 2.2 cm LEFT ATRIUM           Index        RIGHT ATRIUM           Index LA diam:      4.50 cm 2.08 cm/m   RA Area:     16.90 cm LA Vol (A2C): 48.8 ml 22.51 ml/m  RA Volume:   40.60 ml  18.72 ml/m LA Vol (A4C): 73.5 ml 33.90 ml/m  AORTIC VALVE AV Area (Vmax):    2.98 cm AV Area (Vmean):   3.01 cm AV Area (VTI):     2.97 cm AV Vmax:           127.33 cm/s AV Vmean:          86.533 cm/s  AV VTI:            0.228 m AV Peak Grad:      6.5 mmHg AV Mean Grad:      3.3 mmHg LVOT Vmax:         77.20 cm/s LVOT Vmean:        53.133 cm/s LVOT VTI:          0.138 m LVOT/AV VTI ratio: 0.61  AORTA Ao Root diam: 3.60 cm Ao Asc diam:  3.10 cm MITRAL VALVE                  TRICUSPID VALVE MV Area (PHT): 5.13 cm       TR Peak grad:   24.6 mmHg MV Decel Time: 148 msec       TR Vmax:        248.00 cm/s MR Peak grad:    87.6 mmHg MR Mean grad:    60.0 mmHg    SHUNTS MR Vmax:         468.00 cm/s  Systemic VTI:  0.14 m MR Vmean:        362.0 cm/s   Systemic Diam: 2.50 cm MR PISA:         2.26 cm MR PISA Eff ROA: 19 mm MR PISA Radius:  0.60 cm MV E velocity: 105.00 cm/s MV A velocity: 125.00 cm/s MV E/A ratio:  0.84 Vishnu Priya Mallipeddi Electronically signed by Lucetta Russel Mallipeddi Signature Date/Time: 12/16/2023/1:38:39 PM  Final    CT CHEST ABDOMEN PELVIS W CONTRAST Result Date: 12/15/2023 CLINICAL DATA:  Newly diagnosed colon carcinoma. Staging. * Tracking Code: BO * EXAM: CT CHEST, ABDOMEN, AND PELVIS WITH CONTRAST TECHNIQUE: Multidetector CT imaging of the chest, abdomen and pelvis was performed following the standard protocol during bolus administration of intravenous contrast. RADIATION DOSE REDUCTION: This exam was performed according to the departmental dose-optimization program which includes automated exposure control, adjustment of the mA and/or kV according to patient size and/or use of iterative reconstruction technique. CONTRAST:  75mL ISOVUE -370 IOPAMIDOL  (ISOVUE -370) INJECTION 76% COMPARISON:  None Available. FINDINGS: CT CHEST FINDINGS Cardiovascular: No acute findings. Mediastinum/Lymph Nodes: No masses or pathologically enlarged lymph nodes identified. Lungs/Pleura: No suspicious pulmonary nodules or masses identified. No evidence of infiltrate or pleural effusion. Musculoskeletal:  No suspicious bone lesions identified. CT ABDOMEN AND PELVIS FINDINGS Hepatobiliary: No masses identified.  Gallbladder is unremarkable. No evidence of biliary ductal dilatation. Pancreas:  No mass or inflammatory changes. Spleen:  Within normal limits in size and appearance. Adrenals/Urinary tract: No suspicious masses or hydronephrosis. Bilateral benign renal sinus cysts noted. Stomach/Bowel: Bulky annular constricting mass is seen in the hepatic flexure of the colon, measuring 6.7 by 4.6 cm, consistent with colon carcinoma. No evidence of bowel obstruction. Diverticulosis is seen mainly involving the sigmoid colon, however there is no evidence of diverticulitis. Vascular/Lymphatic: Pericolonic and right upper quadrant mesenteric lymphadenopathy is seen with lymph nodes measuring up to 10 mm, consistent with metastatic disease. No acute vascular findings. Reproductive: Uterus is unremarkable. Simple left ovarian cyst is seen measuring 3.3 cm. No No evidence of inflammatory process or ascites. Other:  None. Musculoskeletal:  No suspicious bone lesions identified. IMPRESSION: Bulky annular constricting mass in the hepatic flexure of the colon, consistent with colon carcinoma. Pericolonic and right upper quadrant mesenteric lymphadenopathy, consistent with metastatic disease. No other sites of metastatic disease identified. 3.3 cm benign-appearing left ovarian cyst. Recommend follow-up US  in 6-12 months. Reference: JACR 2020 Feb; 17(2):248-254 Sigmoid diverticulosis, without radiographic evidence of diverticulitis. Electronically Signed   By: Marlyce Sine M.D.   On: 12/15/2023 12:00    Microbiology: Results for orders placed or performed in visit on 04/23/17  WOUND CULTURE     Status: Abnormal   Collection Time: 04/23/17 10:25 AM   Specimen: Wound   WOUND  Result Value Ref Range Status   Gram Stain Result Final report  Final   Organism ID, Bacteria Comment  Final    Comment: No white blood cells seen.   Organism ID, Bacteria Comment  Final    Comment: Many gram negative diplococci.   Organism ID, Bacteria  Comment  Final    Comment: Many gram positive cocci.   Aerobic Bacterial Culture Final report (A)  Final   Organism ID, Bacteria Comment (A)  Final    Comment: Beta hemolytic Streptococcus, group B Heavy growth Penicillin and ampicillin are drugs of choice for treatment of beta-hemolytic streptococcal infections. Susceptibility testing of penicillins and other beta-lactam agents approved by the FDA for treatment of beta-hemolytic streptococcal infections need not be performed routinely because nonsusceptible isolates are extremely rare in any beta-hemolytic streptococcus and have not been reported for Streptococcus pyogenes (group A). (CLSI 2011)    Organism ID, Bacteria Mixed skin flora  Final    Comment: Heavy growth    Labs: CBC: Recent Labs  Lab 12/17/23 1720 12/18/23 0314 12/19/23 0254 12/20/23 0302 12/21/23 0313 12/22/23 0304  WBC 6.3 6.3 11.4* 7.5 8.1 6.5  NEUTROABS  4.1  --   --   --   --   --   HGB 9.6* 8.9* 8.2* 7.6* 9.2* 8.3*  HCT 32.6* 29.9* 26.7* 25.1* 29.6* 27.4*  MCV 72.4* 72.4* 74.0* 75.1* 74.0* 75.9*  PLT 440* 404* 331 293 390 391   Basic Metabolic Panel: Recent Labs  Lab 12/17/23 0500 12/17/23 0910 12/18/23 0314 12/19/23 0254 12/20/23 0302 12/21/23 0313 12/22/23 0304  NA 135   < > 139 138 138 139 137  K 4.1   < > 4.4 4.2 3.9 3.8 3.8  CL 105  --  107 109 106 103 105  CO2 22  --  23 22 25 22 25   GLUCOSE 106*  --  101* 123* 100* 94 93  BUN 10  --  9 9 10 10 14   CREATININE 1.28*  --  1.07* 1.08* 1.24* 1.07* 1.25*  CALCIUM 8.9  --  9.1 8.6* 8.8* 9.4 8.9  MG 2.0  --   --   --   --   --   --    < > = values in this interval not displayed.   Liver Function Tests: No results for input(s): "AST", "ALT", "ALKPHOS", "BILITOT", "PROT", "ALBUMIN" in the last 168 hours. CBG: Recent Labs  Lab 12/17/23 1047  GLUCAP 84    Discharge time spent: {LESS THAN/GREATER THAN:26388} 30 minutes.  Signed: Deforest Fast, MD [facility] 12/22/2023

## 2023-12-24 NOTE — Progress Notes (Unsigned)
 [facility]   Telephone:(336) [phone] Fax:(336) [phone]   Clinic New Consult Note   Patient Care Team: Collins Dean, NP as PCP - General (Nurse Practitioner) Sheryle Donning, MD as PCP - Cardiology (Cardiology) Rhetta Cellar, RN as Oncology Nurse Navigator 12/26/2023  CHIEF COMPLAINTS/PURPOSE OF CONSULTATION:  Anemia and Colon cancer, referred by hospitalist   History of Present Illness  Leah Hernandez is a 63 year old female with PMH including obesity, pre-DM, and mild CKD is here due to newly diagnosed colon cancer. She presented with IDA, hgb 5.7, ferritin 3, iron  10, 3% sat, and normal TIBC 379 s/p total of 4 units RBC transfusions and 3 doses IV venofer , unintentional weight loss, and constipation with rectal bleeding. Further work up showed low-normal B12 of 250. She began B12 injections. She underwent EGD/Colonoscopy by Dr. Yvone Herd 12/14/23 showing gastritis and small hernia as well as a malignant appearing partially obstructing 7 cm tumor in the transverse colon. Path confirmed adenocarcinoma. Baseline CEA elevated, 27.6. staging CT CAP showed the primary malignancy at the hepatic flexure as well as pericolonic and right upper quadrant mesenteric lymphadenopathy up to 10 mm consistent with nodal spread. She underwent lap assisted right hemicolectomy by Dr. Davonna Estes 12/18/23. Surgical path showed a 9.5 cm G2 invasive adenocarcinoma in the right colon invading through muscularis propria into pericolorectal soft tissue. No LVI but perineural invasion is identified. 28 lymph nodes all negative, margins are clear. This was staged pT3N0 with preserved expression of major mismatch repair proteins.   Socially she is married, 4 healthy children.  She has not been working but remains active.  Independent with ADLs prior to this illness and her daughter has been helping her.  Denies alcohol or drug use, smokes cigarettes socially in her 73s and 30s.  Has not had other  cancer screenings.  A brother had cancer of unknown type.   Today, she presents with her spouse, recovering well from surgery.  She has intermittent soreness managed with Tylenol  and occasional oxycodone  as needed.  She has some ice pica and feels cold.  Denies recurrent rectal bleeding.  Stools are loose up to 10/day mostly at night, has not tried medication.  Appetite and energy are improving but not quite 50% back.    MEDICAL HISTORY:  Past Medical History:  Diagnosis Date   Prediabetes     SURGICAL HISTORY: Past Surgical History:  Procedure Laterality Date   BIOPSY OF SKIN SUBCUTANEOUS TISSUE AND/OR MUCOUS MEMBRANE  12/14/2023   Procedure: BIOPSY, SKIN, SUBCUTANEOUS TISSUE, OR MUCOUS MEMBRANE;  Surgeon: Truddie Furrow, MD;  Location: [facility] ENDOSCOPY;  Service: Gastroenterology;;   COLONOSCOPY N/A 12/14/2023   Procedure: COLONOSCOPY;  Surgeon: Truddie Furrow, MD;  Location: [facility] ENDOSCOPY;  Service: Gastroenterology;  Laterality: N/A;   ESOPHAGOGASTRODUODENOSCOPY N/A 12/14/2023   Procedure: EGD (ESOPHAGOGASTRODUODENOSCOPY);  Surgeon: Truddie Furrow, MD;  Location: [facility] ENDOSCOPY;  Service: Gastroenterology;  Laterality: N/A;   LAPAROSCOPIC PARTIAL COLECTOMY Right 12/18/2023   Procedure: LAPAROSCOPIC assisted  COLECTOMY;  Surgeon: Cannon Champion, MD;  Location: [facility] OR;  Service: General;  Laterality: Right;   POLYPECTOMY  12/14/2023   Procedure: POLYPECTOMY, INTESTINE;  Surgeon: Truddie Furrow, MD;  Location: [facility] ENDOSCOPY;  Service: Gastroenterology;;   RIGHT/LEFT HEART CATH AND CORONARY ANGIOGRAPHY N/A 12/17/2023   Procedure: RIGHT/LEFT HEART CATH AND CORONARY ANGIOGRAPHY;  Surgeon: Sammy Crisp, MD;  Location: [facility] INVASIVE CV LAB;  Service: Cardiovascular;  Laterality: N/A;   SCLEROTHERAPY  12/14/2023   Procedure:  SCLEROTHERAPY;  Surgeon: Truddie Furrow, MD;  Location: [facility] ENDOSCOPY;  Service: Gastroenterology;;   TUBAL LIGATION      SOCIAL HISTORY: Social History    Socioeconomic History   Marital status: Married    Spouse name: Not on file   Number of children: 4   Years of education: Not on file   Highest education level: Not on file  Occupational History   Not on file  Tobacco Use   Smoking status: Never   Smokeless tobacco: Never  Vaping Use   Vaping status: Never Used  Substance and Sexual Activity   Alcohol use: No   Drug use: No   Sexual activity: Yes    Birth control/protection: Surgical  Other Topics Concern   Not on file  Social History Narrative   Not on file   Social Drivers of Health   Financial Resource Strain: Not on file  Food Insecurity: No Food Insecurity (12/26/2023)   Hunger Vital Sign    Worried About Running Out of Food in the Last Year: Never true    Ran Out of Food in the Last Year: Never true  Transportation Needs: No Transportation Needs (12/26/2023)   PRAPARE - Administrator, Civil Service (Medical): No    Lack of Transportation (Non-Medical): No  Physical Activity: Not on file  Stress: Not on file  Social Connections: Not on file  Intimate Partner Violence: Not At Risk (12/26/2023)   Humiliation, Afraid, Rape, and Kick questionnaire    Fear of Current or Ex-Partner: No    Emotionally Abused: No    Physically Abused: No    Sexually Abused: No    FAMILY HISTORY: Family History  Problem Relation Age of Onset   Cancer Brother    Hypertension Neg Hx     ALLERGIES:  has no known allergies.  MEDICATIONS:  Current Outpatient Medications  Medication Sig Dispense Refill   acetaminophen  (TYLENOL ) 325 MG tablet Take 325-650 mg by mouth every 6 (six) hours as needed for mild pain (pain score 1-3) or moderate pain (pain score 4-6).     dapagliflozin propanediol (FARXIGA) 10 MG TABS tablet Take 1 tablet (10 mg total) by mouth daily. 30 tablet 1   metoprolol  succinate (TOPROL -XL) 100 MG 24 hr tablet Take 1 tablet (100 mg total) by mouth daily. Take with or immediately following a meal. 30 tablet  1   oxyCODONE  (OXY IR/ROXICODONE ) 5 MG immediate release tablet Take 1 tablet (5 mg total) by mouth every 6 (six) hours as needed for severe pain (pain score 7-10). 30 tablet 0   polyethylene glycol powder (GLYCOLAX /MIRALAX ) 17 GM/SCOOP powder Take 17 g by mouth daily. 238 g 0   spironolactone (ALDACTONE) 25 MG tablet Take 0.5 tablets (12.5 mg total) by mouth daily. 30 tablet 1   No current facility-administered medications for this visit.    REVIEW OF SYSTEMS:   Constitutional: Denies fevers, chills or abnormal night sweats (+) weight loss  Eyes: Denies blurriness of vision, double vision or watery eyes Ears, nose, mouth, throat, and face: Denies mucositis or sore throat Respiratory: Denies cough, dyspnea or wheezes (+) cough Cardiovascular: Denies palpitation, chest discomfort or lower extremity swelling Gastrointestinal:  Denies nausea, heartburn (+) change in bowel habits Skin: Denies abnormal skin rashes Lymphatics: Denies new lymphadenopathy or easy bruising Neurological:Denies numbness, tingling or new weaknesses Behavioral/Psych: Mood is stable, no new changes  All other systems were reviewed with the patient and are negative.  PHYSICAL EXAMINATION: ECOG PERFORMANCE STATUS:  1 - Symptomatic but completely ambulatory  Vitals:   12/26/23 1017  BP: 112/78  Pulse: (!) 119  Resp: 17  Temp: 97.6 F (36.4 C)  SpO2: 99%   Filed Weights   12/26/23 1017  Weight: 193 lb 9.6 oz (87.8 kg)    GENERAL:alert, no distress and comfortable SKIN:  no rashes or significant lesions EYES: sclera clear NECK: without nodularity LYMPH:  no palpable cervical lymphadenopathy LUNGS: clear with normal breathing effort HEART: regular rate & rhythm, no lower extremity edema ABDOMEN:abdomen soft, non-tender and normal bowel sounds. Midline incision closed with dermabond. Mild abdominal distention Musculoskeletal:no cyanosis of digits and no clubbing  PSYCH: alert & oriented x 3 with fluent  speech NEURO: no focal motor/sensory deficits    LABORATORY DATA:  I have reviewed the data as listed    Latest Ref Rng & Units 12/22/2023    3:04 AM 12/21/2023    3:13 AM 12/20/2023    3:02 AM  CBC  WBC 4.0 - 10.5 K/uL 6.5  8.1  7.5   Hemoglobin 12.0 - 15.0 g/dL 8.3  9.2  7.6   Hematocrit 36.0 - 46.0 % 27.4  29.6  25.1   Platelets 150 - 400 K/uL 391  390  293        Latest Ref Rng & Units 12/22/2023    3:04 AM 12/21/2023    3:13 AM 12/20/2023    3:02 AM  CMP  Glucose 70 - 99 mg/dL 93  94  161   BUN 8 - 23 mg/dL 14  10  10    Creatinine 0.44 - 1.00 mg/dL 0.96  0.45  4.09   Sodium 135 - 145 mmol/L 137  139  138   Potassium 3.5 - 5.1 mmol/L 3.8  3.8  3.9   Chloride 98 - 111 mmol/L 105  103  106   CO2 22 - 32 mmol/L 25  22  25    Calcium 8.9 - 10.3 mg/dL 8.9  9.4  8.8      RADIOGRAPHIC STUDIES: I have personally reviewed the radiological images as listed and agreed with the findings in the report. CARDIAC CATHETERIZATION Result Date: 12/17/2023 Conclusions: No angiographically significant coronary artery disease.  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 Normal left heart filling pressures. Mildly elevated right heart and pulmonary artery pressures. Normal Fick cardiac output/index. Recommendations: Maintain net even fluid balance. Escalate goal-directed medical therapy for management of non-ischemic cardiomyopathy. Sammy Crisp, MD [facility]  ECHOCARDIOGRAM COMPLETE Result Date: 12/16/2023    ECHOCARDIOGRAM REPORT   Patient Name:   MORGIN HALLS Date of Exam: 12/16/2023 Medical Rec #:  [id number]         Height:       67.0 in Accession #:    [id number]        Weight:       235.7 lb Date of Birth:  [birth year]         BSA:          2.168 m Patient Age:    63 years          BP:           111/63 mmHg Patient Gender: F                 HR:           97 bpm. Exam Location:  Inpatient Procedure: 2D Echo, 3D Echo, Cardiac Doppler, Color Doppler  and Strain Analysis            (  Both Spectral and Color Flow Doppler were utilized during            procedure). Indications:    NSVT (nonsustained ventricular tachycardia)  History:        Patient has no prior history of Echocardiogram examinations.                 Risk Factors:Prediabetes.  Sonographer:    Travis Friedman RDCS Referring Phys: [id number] DAWOOD S ELGERGAWY IMPRESSIONS  1. Left ventricular ejection fraction, by estimation, is 20 to 25%. Left ventricular ejection fraction by 3D volume is 38 %. The left ventricle has severely decreased function. The left ventricle demonstrates global hypokinesis. The left ventricular internal cavity size was moderately to severely dilated. There is severe asymmetric left ventricular hypertrophy of the lateral segment. Left ventricular diastolic parameters are consistent with Grade I diastolic dysfunction (impaired relaxation). The average left ventricular global longitudinal strain is -9.4 %. The global longitudinal strain is abnormal.  2. Right ventricular systolic function is normal. The right ventricular size is normal. There is normal pulmonary artery systolic pressure.  3. The mitral valve is normal in structure. No evidence of mitral valve regurgitation. No evidence of mitral stenosis.  4. The aortic valve is tricuspid. Aortic valve regurgitation is not visualized. No aortic stenosis is present.  5. The inferior vena cava is normal in size with greater than 50% respiratory variability, suggesting right atrial pressure of 3 mmHg. Comparison(s): No prior Echocardiogram. FINDINGS  Left Ventricle: Left ventricular ejection fraction, by estimation, is 20 to 25%. Left ventricular ejection fraction by 3D volume is 38 %. The left ventricle has severely decreased function. The left ventricle demonstrates global hypokinesis. The average  left ventricular global longitudinal strain is -9.4 %. Strain was performed and the global longitudinal strain is abnormal. The left ventricular  internal cavity size was moderately to severely dilated. There is severe asymmetric left ventricular hypertrophy of the lateral segment. Left ventricular diastolic parameters are consistent with Grade I diastolic dysfunction (impaired relaxation). Right Ventricle: The right ventricular size is normal. No increase in right ventricular wall thickness. Right ventricular systolic function is normal. There is normal pulmonary artery systolic pressure. The tricuspid regurgitant velocity is 2.48 m/s, and  with an assumed right atrial pressure of 3 mmHg, the estimated right ventricular systolic pressure is 27.6 mmHg. Left Atrium: Left atrial size was normal in size. Right Atrium: Right atrial size was normal in size. Pericardium: There is no evidence of pericardial effusion. Mitral Valve: The mitral valve is normal in structure. No evidence of mitral valve regurgitation. No evidence of mitral valve stenosis. Tricuspid Valve: The tricuspid valve is normal in structure. Tricuspid valve regurgitation is mild . No evidence of tricuspid stenosis. Aortic Valve: The aortic valve is tricuspid. Aortic valve regurgitation is not visualized. No aortic stenosis is present. Aortic valve mean gradient measures 3.3 mmHg. Aortic valve peak gradient measures 6.5 mmHg. Aortic valve area, by VTI measures 2.97 cm. Pulmonic Valve: The pulmonic valve was not well visualized. Pulmonic valve regurgitation is not visualized. No evidence of pulmonic stenosis. Aorta: The aortic root is normal in size and structure. Venous: The inferior vena cava is normal in size with greater than 50% respiratory variability, suggesting right atrial pressure of 3 mmHg. IAS/Shunts: No atrial level shunt detected by color flow Doppler. Additional Comments: 3D was performed not requiring image post processing on an independent workstation and was indeterminate.  LEFT VENTRICLE PLAX 2D LVIDd:  6.20 cm         Diastology LVIDs:         5.10 cm         LV e'  medial:    8.92 cm/s LV PW:         1.50 cm         LV E/e' medial:  11.8 LV IVS:        0.70 cm         LV e' lateral:   8.38 cm/s LVOT diam:     2.50 cm         LV E/e' lateral: 12.5 LV SV:         68 LV SV Index:   31              2D Longitudinal LVOT Area:     4.91 cm        Strain                                2D Strain GLS   -9.4 %                                Avg:                                 3D Volume EF                                LV 3D EF:    Left                                             ventricul                                             ar                                             ejection                                             fraction                                             by 3D                                             volume is  38 %.                                 3D Volume EF:                                3D EF:        38 %                                LV EDV:       157 ml                                LV ESV:       98 ml                                LV SV:        59 ml RIGHT VENTRICLE             IVC RV Basal diam:  3.20 cm     IVC diam: 1.70 cm RV S prime:     13.50 cm/s TAPSE (M-mode): 2.2 cm LEFT ATRIUM           Index        RIGHT ATRIUM           Index LA diam:      4.50 cm 2.08 cm/m   RA Area:     16.90 cm LA Vol (A2C): 48.8 ml 22.51 ml/m  RA Volume:   40.60 ml  18.72 ml/m LA Vol (A4C): 73.5 ml 33.90 ml/m  AORTIC VALVE AV Area (Vmax):    2.98 cm AV Area (Vmean):   3.01 cm AV Area (VTI):     2.97 cm AV Vmax:           127.33 cm/s AV Vmean:          86.533 cm/s AV VTI:            0.228 m AV Peak Grad:      6.5 mmHg AV Mean Grad:      3.3 mmHg LVOT Vmax:         77.20 cm/s LVOT Vmean:        53.133 cm/s LVOT VTI:          0.138 m LVOT/AV VTI ratio: 0.61  AORTA Ao Root diam: 3.60 cm Ao Asc diam:  3.10 cm MITRAL VALVE                  TRICUSPID VALVE MV Area (PHT): 5.13 cm       TR Peak grad:   24.6 mmHg MV  Decel Time: 148 msec       TR Vmax:        248.00 cm/s MR Peak grad:    87.6 mmHg MR Mean grad:    60.0 mmHg    SHUNTS MR Vmax:         468.00 cm/s  Systemic VTI:  0.14 m MR Vmean:        362.0 cm/s   Systemic Diam: 2.50 cm MR PISA:         2.26 cm MR PISA Eff ROA: 19 mm  MR PISA Radius:  0.60 cm MV E velocity: 105.00 cm/s MV A velocity: 125.00 cm/s MV E/A ratio:  0.84 Vishnu Priya Mallipeddi Electronically signed by Lucetta Russel Mallipeddi Signature Date/Time: 12/16/2023/1:38:39 PM    Final    CT CHEST ABDOMEN PELVIS W CONTRAST Result Date: 12/15/2023 CLINICAL DATA:  Newly diagnosed colon carcinoma. Staging. * Tracking Code: BO * EXAM: CT CHEST, ABDOMEN, AND PELVIS WITH CONTRAST TECHNIQUE: Multidetector CT imaging of the chest, abdomen and pelvis was performed following the standard protocol during bolus administration of intravenous contrast. RADIATION DOSE REDUCTION: This exam was performed according to the departmental dose-optimization program which includes automated exposure control, adjustment of the mA and/or kV according to patient size and/or use of iterative reconstruction technique. CONTRAST:  75mL ISOVUE -370 IOPAMIDOL  (ISOVUE -370) INJECTION 76% COMPARISON:  None Available. FINDINGS: CT CHEST FINDINGS Cardiovascular: No acute findings. Mediastinum/Lymph Nodes: No masses or pathologically enlarged lymph nodes identified. Lungs/Pleura: No suspicious pulmonary nodules or masses identified. No evidence of infiltrate or pleural effusion. Musculoskeletal:  No suspicious bone lesions identified. CT ABDOMEN AND PELVIS FINDINGS Hepatobiliary: No masses identified. Gallbladder is unremarkable. No evidence of biliary ductal dilatation. Pancreas:  No mass or inflammatory changes. Spleen:  Within normal limits in size and appearance. Adrenals/Urinary tract: No suspicious masses or hydronephrosis. Bilateral benign renal sinus cysts noted. Stomach/Bowel: Bulky annular constricting mass is seen in the hepatic  flexure of the colon, measuring 6.7 by 4.6 cm, consistent with colon carcinoma. No evidence of bowel obstruction. Diverticulosis is seen mainly involving the sigmoid colon, however there is no evidence of diverticulitis. Vascular/Lymphatic: Pericolonic and right upper quadrant mesenteric lymphadenopathy is seen with lymph nodes measuring up to 10 mm, consistent with metastatic disease. No acute vascular findings. Reproductive: Uterus is unremarkable. Simple left ovarian cyst is seen measuring 3.3 cm. No No evidence of inflammatory process or ascites. Other:  None. Musculoskeletal:  No suspicious bone lesions identified. IMPRESSION: Bulky annular constricting mass in the hepatic flexure of the colon, consistent with colon carcinoma. Pericolonic and right upper quadrant mesenteric lymphadenopathy, consistent with metastatic disease. No other sites of metastatic disease identified. 3.3 cm benign-appearing left ovarian cyst. Recommend follow-up US  in 6-12 months. Reference: JACR 2020 Feb; 17(2):248-254 Sigmoid diverticulosis, without radiographic evidence of diverticulitis. Electronically Signed   By: Marlyce Sine M.D.   On: 12/15/2023 12:00    ASSESSMENT & PLAN: 63 yo female   Adenocarcinoma of the hepatic flexure, G2, pT3 N0 M0, stage IIA, MMR normal -Presented with IDA, 30 lbs unintentional weight loss, constipation with rectal bleeding.  -EGD/Colonoscopy by Dr. Yvone Herd 12/14/23 showing gastritis and small hernia as well as a malignant appearing partially obstructing 7 cm tumor in the transverse colon. Path confirmed adenocarcinoma.  -Baseline CEA elevated, 27.6 -Staging CT CAP showed the primary malignancy at the hepatic flexure as well as pericolonic and right upper quadrant mesenteric lymphadenopathy up to 10 mm suspicious for nodal involvement -S/p right hemicolectomy by Dr. Davonna Estes 12/18/23. Surgical path showed a 9.5 cm G2 invasive adenocarcinoma in the right colon invading through muscularis propria  into pericolorectal soft tissue. No LVI but perineural invasion is identified. 28 lymph nodes all negative, margins are clear.  -We reviewed that tumor was completely removed in surgery and she is likely cured in surgery, but due to certain high risk features (elevated CEA and LVI), she is at moderate risk for recurrence -We recommend Guardant Reveal for circulating tumor DNA to further stratify her risk and determine benefit of adjuvant chemo (FOLFOX due to right  colon cancer). She is interested -If ctDNA is not detected, she would go on surveillance including colonoscopy at 1 year post op, CT in 1 and 2 years, and lab/fup q4 months x2-3 years then annually for total 5 years surveillance.  -She agrees with the plan and is recovering well from surgery. Would be a good candidate for chemo if indicated.  -We discussed that her children and siblings should be informed so they can begin testing if they are 40-45 + and have not yet had colonoscopy  -She will return for guardant reveal next week, then phone visit in 4 weeks to review results -Pt seen with Dr. Maryalice Smaller   Anemia, 2/2 iron  and B12 deficiency  -Secondary to #1  -Normal CBC in 2021, hgb 5.7, ferritin 3, iron  10, 3% sat, and normal TIBC 379 s/p total of 4 units RBC transfusions and 3 doses IV venofer  (200 mg each) Low-normal B12 of 250; she began B12 injections.  -We recommend 2 additional doses IV Venofer  200 mg to complete 1 g replacement, pt agrees -She prefers to get B12 from prenatal vitamin for now, and resume injections if B12 drops again in the future. We discussed she may not absorb well in the future s/p hemicolectomy  -Recommend taking prenatal vitamin    PLAN: -Endoscopies, path, imaging, and labs reviewed -Guardant reveal for ctDNA next week, phone f/up in 4 weeks to discuss results -IV venofer  200 mg x2 next week at [facility] -Begin prenatal vitamin -Pt seen with Dr. Maryalice Smaller    Orders Placed This Encounter  Procedures   Guardant  Reveal    Stage IIA right colon cancer Baseline to determine benefit of adjuvant therapy  Surgery 5/20    Standing Status:   Future    Number of Occurrences:   1    Expected Date:   01/02/2024    Expiration Date:   12/25/2024   CBC with Differential (Cancer Center Only)    Standing Status:   Future    Number of Occurrences:   1    Expiration Date:   12/25/2024   CEA (Access)-[facility] ONLY    Standing Status:   Future    Number of Occurrences:   1    Expiration Date:   12/25/2024   CMP (Cancer Center only)    Standing Status:   Future    Number of Occurrences:   1    Expiration Date:   12/25/2024   Ferritin    Standing Status:   Future    Number of Occurrences:   1    Expiration Date:   12/25/2024   Iron  and Iron  Binding Capacity ([facility] only)    Standing Status:   Future    Number of Occurrences:   1    Expiration Date:   12/25/2024    All questions were answered. The patient knows to call the clinic with any problems, questions or concerns.      Daneille Desilva K Clarence Dunsmore, NP 12/26/2023    Addendum I have seen the patient, examined her. I agree with the assessment and and plan and have edited the notes.   63 year old female presented with severe iron  deficient anemia, and workup reviewed colon cancer.  I reviewed her surgical pathology, which showed moderate differentiated adenocarcinoma, T3 N0, with perineural invasion, no other high risk features.  I recommend ctDNA GardantReveal in 1-2 weeks, and will recommend chemotherapy if it is positive.  Patient is agreeable.  Her anemia has improved, but still moderate, will give  2 more doses of IV iron .  Will monitor her labs closely.  All questions were answered.  I spent a total of 45 minutes for her visit today, more than 50% time on face-to-face counseling.  Sonja  MD 12/26/2023

## 2023-12-26 ENCOUNTER — Telehealth: Payer: Self-pay | Admitting: Nurse Practitioner

## 2023-12-26 ENCOUNTER — Inpatient Hospital Stay (HOSPITAL_BASED_OUTPATIENT_CLINIC_OR_DEPARTMENT_OTHER): Admitting: Nurse Practitioner

## 2023-12-26 ENCOUNTER — Inpatient Hospital Stay: Attending: Nurse Practitioner

## 2023-12-26 ENCOUNTER — Encounter: Payer: Self-pay | Admitting: Nurse Practitioner

## 2023-12-26 VITALS — BP 112/78 | HR 119 | Temp 97.6°F | Resp 17 | Wt 193.6 lb

## 2023-12-26 DIAGNOSIS — E1122 Type 2 diabetes mellitus with diabetic chronic kidney disease: Secondary | ICD-10-CM | POA: Insufficient documentation

## 2023-12-26 DIAGNOSIS — F5089 Other specified eating disorder: Secondary | ICD-10-CM | POA: Insufficient documentation

## 2023-12-26 DIAGNOSIS — N189 Chronic kidney disease, unspecified: Secondary | ICD-10-CM | POA: Insufficient documentation

## 2023-12-26 DIAGNOSIS — R634 Abnormal weight loss: Secondary | ICD-10-CM | POA: Insufficient documentation

## 2023-12-26 DIAGNOSIS — D500 Iron deficiency anemia secondary to blood loss (chronic): Secondary | ICD-10-CM

## 2023-12-26 DIAGNOSIS — K625 Hemorrhage of anus and rectum: Secondary | ICD-10-CM | POA: Diagnosis not present

## 2023-12-26 DIAGNOSIS — D509 Iron deficiency anemia, unspecified: Secondary | ICD-10-CM | POA: Diagnosis not present

## 2023-12-26 DIAGNOSIS — R590 Localized enlarged lymph nodes: Secondary | ICD-10-CM | POA: Diagnosis not present

## 2023-12-26 DIAGNOSIS — Z7984 Long term (current) use of oral hypoglycemic drugs: Secondary | ICD-10-CM | POA: Insufficient documentation

## 2023-12-26 DIAGNOSIS — K5900 Constipation, unspecified: Secondary | ICD-10-CM | POA: Diagnosis not present

## 2023-12-26 DIAGNOSIS — E669 Obesity, unspecified: Secondary | ICD-10-CM | POA: Insufficient documentation

## 2023-12-26 DIAGNOSIS — C183 Malignant neoplasm of hepatic flexure: Secondary | ICD-10-CM | POA: Insufficient documentation

## 2023-12-26 DIAGNOSIS — Z79899 Other long term (current) drug therapy: Secondary | ICD-10-CM | POA: Diagnosis not present

## 2023-12-26 NOTE — Progress Notes (Signed)
 PATIENT NAVIGATOR PROGRESS NOTE  Name: Leah Hernandez Date: 12/26/2023 MRN: [id number]  DOB: Mar 01, 1961   Reason for visit:  New Patient Appt  Comments:  Met with patient during her initial appt with Lacie Burton, NP and Dr. Sonja Glasscock. Patient was given my direct contact information and instructed to contact office if she had any questions or concerns after today.  Patient given [facility] binder with information specific to her diagnosis.  Patient denied any need for transportation or food insecurity. Patient open to nutrition referral and to social work referral.    Will follow-up with patient at her next scheduled appt.      Time spent counseling/coordinating care: > 60 minutes

## 2023-12-26 NOTE — Telephone Encounter (Signed)
 Left the patient a voicemail with updated appointment details.

## 2023-12-27 ENCOUNTER — Other Ambulatory Visit (HOSPITAL_COMMUNITY): Payer: Self-pay

## 2023-12-27 ENCOUNTER — Telehealth (HOSPITAL_COMMUNITY): Payer: Self-pay

## 2023-12-27 ENCOUNTER — Telehealth: Payer: Self-pay

## 2023-12-27 NOTE — Telephone Encounter (Signed)
 Called to confirm/remind patient of their appointment at the [facility] on 12/28/2023 10:15.   Appointment:   [] Confirmed  [x] Left mess   [] No answer/No voice mail  [] VM Full/unable to leave message  [] Phone not in service  Patient reminded to bring all medications and/or complete list.  Confirmed patient has transportation. Gave directions, instructed to utilize valet parking.

## 2023-12-27 NOTE — Telephone Encounter (Signed)
 Leah Hernandez, patient will be scheduled as soon as possible.  Auth Submission: NO AUTH NEEDED Site of care: Site of care: [facility] Payer: [employer] [employer] medicaid Medication & CPT/J Code(s) submitted: Venofer  (Iron  Sucrose) J1756 Route of submission (phone, fax, portal):  Phone # Fax # Auth type: Buy/Bill PB Units/visits requested: 200mg  x 2 doses Reference number:  Approval from: 12/27/23 to 04/28/24

## 2023-12-27 NOTE — Telephone Encounter (Signed)
 Pharmacy Patient Advocate Encounter  Received notification from [facility] that Prior Authorization for Farixga 10mg  has been CANCELLED due to duplicate request.  See other telephone encounter for details on approval.

## 2023-12-28 ENCOUNTER — Ambulatory Visit (HOSPITAL_COMMUNITY): Payer: Self-pay | Admitting: Family Medicine

## 2023-12-28 ENCOUNTER — Other Ambulatory Visit (HOSPITAL_COMMUNITY): Payer: Self-pay | Admitting: Internal Medicine

## 2023-12-28 ENCOUNTER — Telehealth (HOSPITAL_COMMUNITY): Payer: Self-pay

## 2023-12-28 ENCOUNTER — Other Ambulatory Visit (HOSPITAL_COMMUNITY): Payer: Self-pay

## 2023-12-28 ENCOUNTER — Ambulatory Visit (HOSPITAL_COMMUNITY)
Admit: 2023-12-28 | Discharge: 2023-12-28 | Disposition: A | Discharge status: Home / Self Care | Source: Ambulatory Visit | Attending: Family Medicine | Admitting: Family Medicine

## 2023-12-28 ENCOUNTER — Encounter (HOSPITAL_COMMUNITY): Payer: Self-pay

## 2023-12-28 VITALS — BP 102/70 | HR 99 | Wt 192.2 lb

## 2023-12-28 DIAGNOSIS — D649 Anemia, unspecified: Secondary | ICD-10-CM | POA: Diagnosis not present

## 2023-12-28 DIAGNOSIS — C184 Malignant neoplasm of transverse colon: Secondary | ICD-10-CM | POA: Diagnosis not present

## 2023-12-28 DIAGNOSIS — D509 Iron deficiency anemia, unspecified: Secondary | ICD-10-CM | POA: Diagnosis not present

## 2023-12-28 DIAGNOSIS — I5022 Chronic systolic (congestive) heart failure: Secondary | ICD-10-CM | POA: Diagnosis not present

## 2023-12-28 DIAGNOSIS — C189 Malignant neoplasm of colon, unspecified: Secondary | ICD-10-CM | POA: Diagnosis not present

## 2023-12-28 MED ORDER — FUROSEMIDE 40 MG PO TABS: 40.0000 mg | ORAL_TABLET | Freq: Every day | ORAL | 3 refills | Status: DC | PRN

## 2023-12-28 MED ORDER — DIGOXIN 125 MCG PO TABS: 0.1250 mg | ORAL_TABLET | Freq: Every day | ORAL | 3 refills | Status: DC

## 2023-12-28 MED ORDER — DAPAGLIFLOZIN PROPANEDIOL 10 MG PO TABS: 10.0000 mg | ORAL_TABLET | Freq: Every day | ORAL | 1 refills | Status: DC

## 2023-12-28 MED ORDER — SPIRONOLACTONE 25 MG PO TABS: 12.5000 mg | ORAL_TABLET | Freq: Every day | ORAL | 1 refills | Status: DC

## 2023-12-28 MED ORDER — METOPROLOL SUCCINATE ER 100 MG PO TB24: 100.0000 mg | ORAL_TABLET | Freq: Every day | ORAL | 1 refills | Status: DC

## 2023-12-28 NOTE — Telephone Encounter (Signed)
 Advanced [facility] Failure Patient Advocate Encounter  Test billing for this patients current coverage returns a $4 copay for Corlanor. Assistance not needed for this medication at this time.  Kennis Peacock, CPhT Rx Patient Advocate Phone: [phone]

## 2023-12-28 NOTE — Patient Instructions (Signed)
 Medication Changes:  START: DIGOXIN 0.125MG  ONCE DAILY   START: LASIX (FUROSEMIDE) 40MG  ONCE DAILY AS NEEDED FOR SWELLING OR WEIGHT GAIN OF 3 POUNDS OVERNIGHT OR 5 POUNDS IN 1 WEEK   Testing/Procedures:  THIS NEEDS TO BE DONE 4-6 WEEKS AFTER IRON  INFUSION ON 6/9- SCHEDULING WILL REACH OUT TO YOU TO GET THIS SCHEDULED FOR YOU   [facility] [address] [place], [place] [address] Please take advantage of the free valet parking available at the [facility] and [employer] (Entrance C).  Proceed to the [facility] Radiology Department (First Floor) for check-in.   Magnetic resonance imaging (MRI) is a painless test that produces images of the inside of the body without using Xrays.  During an MRI, strong magnets and radio waves work together in a Data processing manager to form detailed images.   MRI images may provide more details about a medical condition than X-rays, CT scans, and ultrasounds can provide.  You may be given earphones to listen for instructions.  You may eat a light breakfast and take medications as ordered with the exception of furosemide, hydrochlorothiazide, chlorthalidone or spironolactone (or any other fluid pill). If you are undergoing a stress MRI, please avoid stimulants for 12 hr prior to test. (I.e. Caffeine, nicotine, chocolate, or antihistamine medications)  If your provider has ordered anti-anxiety medications for this test, then you will need a driver.  An IV will be inserted into one of your veins. Contrast material will be injected into your IV. It will leave your body through your urine within a day. You may be told to drink plenty of fluids to help flush the contrast material out of your system.  You will be asked to remove all metal, including: Watch, jewelry, and other metal objects including hearing aids, hair pieces and dentures. Also wearable glucose monitoring systems (ie. Freestyle Libre and Omnipods) (Braces and fillings normally are not a  problem.)   TEST WILL TAKE APPROXIMATELY 1 HOUR  PLEASE NOTIFY SCHEDULING AT LEAST 24 HOURS IN ADVANCE IF YOU ARE UNABLE TO KEEP YOUR APPOINTMENT. [phone]  For more information and frequently asked questions, please visit our website : [email or web address]  Please call the Cardiac Imaging Nurse Navigators with any questions/concerns. [phone] Office   Follow-Up in: IN 3-4 WEEKS AS SCHEDULED WITH APP CLINIC- PLEASE DO NOT TAKE YOUR DIGOXIN THE MORNING OF THIS APPOINTMENT SO THAT WE CAN GET BLOOD WORK   THEN AGAIN IN 3 MONTHS WITH DR. Alease Amend WITH AN ECHO PLEASE CALL OUR OFFICE AROUND JULY TO GET SCHEDULED FOR YOUR APPOINTMENT. PHONE NUMBER IS [phone] OPTION 2   At the [facility], you and your health needs are our priority. We have a designated team specialized in the treatment of Heart Failure. This Care Team includes your primary Heart Failure Specialized Cardiologist (physician), Advanced Practice Providers (APPs- Physician Assistants and Nurse Practitioners), and Pharmacist who all work together to provide you with the care you need, when you need it.   You may see any of the following providers on your designated Care Team at your next follow up:  Dr. Jules Oar Dr. Peder Bourdon Dr. Alwin Baars Dr. Judyth Nunnery Nieves Bars, NP Ruddy Corral, [place] [facility] [place], [place] Dennise Fitz, NP [place] Lee, NP Luster Salters, PharmD   Please be sure to bring in all your medications bottles to every appointment.   Need to Contact Us :  If you have any questions or concerns before your next appointment please send us   a message through [facility] or call our office at [phone].    TO LEAVE A MESSAGE FOR THE NURSE SELECT OPTION 2, PLEASE LEAVE A MESSAGE INCLUDING: YOUR NAME DATE OF BIRTH CALL BACK NUMBER REASON FOR CALL**this is important as we prioritize the call backs  YOU WILL RECEIVE A CALL BACK THE SAME DAY AS LONG  AS YOU CALL BEFORE 4:00 PM

## 2023-12-31 ENCOUNTER — Encounter (HOSPITAL_COMMUNITY)
Admission: RE | Admit: 2023-12-31 | Discharge: 2023-12-31 | Disposition: A | Discharge status: Home / Self Care | Source: Ambulatory Visit | Attending: Internal Medicine | Admitting: Internal Medicine

## 2023-12-31 DIAGNOSIS — D509 Iron deficiency anemia, unspecified: Secondary | ICD-10-CM | POA: Insufficient documentation

## 2023-12-31 MED ORDER — SODIUM CHLORIDE 0.9 % IV SOLN
510.0000 mg | INTRAVENOUS | Status: DC
  Administered 2023-12-31: 510 mg via INTRAVENOUS
  Filled 2023-12-31: qty 510

## 2024-01-01 ENCOUNTER — Other Ambulatory Visit: Payer: Self-pay

## 2024-01-01 ENCOUNTER — Other Ambulatory Visit (HOSPITAL_COMMUNITY): Payer: Self-pay

## 2024-01-01 DIAGNOSIS — I5021 Acute systolic (congestive) heart failure: Secondary | ICD-10-CM | POA: Diagnosis not present

## 2024-01-01 DIAGNOSIS — N179 Acute kidney failure, unspecified: Secondary | ICD-10-CM | POA: Diagnosis not present

## 2024-01-01 DIAGNOSIS — I428 Other cardiomyopathies: Secondary | ICD-10-CM | POA: Diagnosis not present

## 2024-01-01 DIAGNOSIS — D500 Iron deficiency anemia secondary to blood loss (chronic): Secondary | ICD-10-CM | POA: Diagnosis not present

## 2024-01-01 DIAGNOSIS — E43 Unspecified severe protein-calorie malnutrition: Secondary | ICD-10-CM | POA: Diagnosis not present

## 2024-01-01 DIAGNOSIS — C183 Malignant neoplasm of hepatic flexure: Secondary | ICD-10-CM | POA: Diagnosis not present

## 2024-01-01 DIAGNOSIS — Z13220 Encounter for screening for lipoid disorders: Secondary | ICD-10-CM | POA: Diagnosis not present

## 2024-01-01 DIAGNOSIS — D649 Anemia, unspecified: Secondary | ICD-10-CM | POA: Diagnosis not present

## 2024-01-01 MED ORDER — TRAMADOL HCL 50 MG PO TABS
50.0000 mg | ORAL_TABLET | Freq: Four times a day (QID) | ORAL | 0 refills | Status: DC | PRN
  Filled 2024-01-01: qty 20, 5d supply, fill #0

## 2024-01-01 NOTE — Progress Notes (Signed)
 As per Lacie Burton NP, placed order in portal for [employer]ant [employer], attached all needed paperwork to the order. Received confirmation of receipt of order. Placed paperwork in lab to be drawn on 06/04.

## 2024-01-02 ENCOUNTER — Other Ambulatory Visit: Payer: Self-pay

## 2024-01-02 ENCOUNTER — Inpatient Hospital Stay: Attending: Nurse Practitioner

## 2024-01-02 DIAGNOSIS — D500 Iron deficiency anemia secondary to blood loss (chronic): Secondary | ICD-10-CM

## 2024-01-02 DIAGNOSIS — C183 Malignant neoplasm of hepatic flexure: Secondary | ICD-10-CM

## 2024-01-02 DIAGNOSIS — D509 Iron deficiency anemia, unspecified: Secondary | ICD-10-CM | POA: Diagnosis not present

## 2024-01-02 LAB — CBC WITH DIFFERENTIAL (CANCER CENTER ONLY)
Abs Immature Granulocytes: 0.01 10*3/uL (ref 0.00–0.07)
Basophils Absolute: 0 10*3/uL (ref 0.0–0.1)
Basophils Relative: 1 %
Eosinophils Absolute: 0.1 10*3/uL (ref 0.0–0.5)
Eosinophils Relative: 1 %
HCT: 30.1 % — ABNORMAL LOW (ref 36.0–46.0)
Hemoglobin: 9.3 g/dL — ABNORMAL LOW (ref 12.0–15.0)
Immature Granulocytes: 0 %
Lymphocytes Relative: 41 %
Lymphs Abs: 1.6 10*3/uL (ref 0.7–4.0)
MCH: 24.1 pg — ABNORMAL LOW (ref 26.0–34.0)
MCHC: 30.9 g/dL (ref 30.0–36.0)
MCV: 78 fL — ABNORMAL LOW (ref 80.0–100.0)
Monocytes Absolute: 0.4 10*3/uL (ref 0.1–1.0)
Monocytes Relative: 11 %
Neutro Abs: 1.8 10*3/uL (ref 1.7–7.7)
Neutrophils Relative %: 46 %
Platelet Count: 339 10*3/uL (ref 150–400)
RBC: 3.86 MIL/uL — ABNORMAL LOW (ref 3.87–5.11)
Smear Review: NORMAL
WBC Count: 3.9 10*3/uL — ABNORMAL LOW (ref 4.0–10.5)
nRBC: 0 % (ref 0.0–0.2)

## 2024-01-02 LAB — CMP (CANCER CENTER ONLY)
ALT: 8 U/L (ref 0–44)
AST: 16 U/L (ref 15–41)
Albumin: 3.9 g/dL (ref 3.5–5.0)
Alkaline Phosphatase: 93 U/L (ref 38–126)
Anion gap: 7 (ref 5–15)
BUN: 11 mg/dL (ref 8–23)
CO2: 26 mmol/L (ref 22–32)
Calcium: 9.5 mg/dL (ref 8.9–10.3)
Chloride: 104 mmol/L (ref 98–111)
Creatinine: 1.07 mg/dL — ABNORMAL HIGH (ref 0.44–1.00)
GFR, Estimated: 58 mL/min — ABNORMAL LOW (ref >60)
Glucose, Bld: 102 mg/dL — ABNORMAL HIGH (ref 70–99)
Potassium: 4.5 mmol/L (ref 3.5–5.1)
Sodium: 137 mmol/L (ref 135–145)
Total Bilirubin: 0.4 mg/dL (ref 0.0–1.2)
Total Protein: 7.6 g/dL (ref 6.5–8.1)

## 2024-01-02 LAB — CEA (ACCESS): CEA (CHCC): 3.68 ng/mL (ref 0.00–5.00)

## 2024-01-02 LAB — IRON AND IRON BINDING CAPACITY (CC-WL,HP ONLY)
Iron: 405 ug/dL — ABNORMAL HIGH (ref 28–170)
Saturation Ratios: 138 % — ABNORMAL HIGH (ref 10.4–31.8)
TIBC: 294 ug/dL (ref 250–450)
UIBC: UNDETERMINED ug/dL (ref 148–442)

## 2024-01-02 LAB — FERRITIN: Ferritin: 566 ng/mL — ABNORMAL HIGH (ref 11–307)

## 2024-01-03 NOTE — Progress Notes (Signed)
 The proposed treatment discussed in conference is for discussion purpose only and is not a binding recommendation.  The patients have not been physically examined, or presented with their treatment options.  Therefore, final treatment plans cannot be decided.

## 2024-01-07 ENCOUNTER — Other Ambulatory Visit: Payer: Self-pay

## 2024-01-07 ENCOUNTER — Encounter (HOSPITAL_COMMUNITY)
Admission: RE | Admit: 2024-01-07 | Discharge: 2024-01-07 | Disposition: A | Discharge status: Home / Self Care | Source: Ambulatory Visit | Attending: Internal Medicine | Admitting: Internal Medicine

## 2024-01-07 DIAGNOSIS — D509 Iron deficiency anemia, unspecified: Secondary | ICD-10-CM

## 2024-01-07 MED ORDER — SODIUM CHLORIDE 0.9 % IV SOLN
510.0000 mg | INTRAVENOUS | Status: AC
  Administered 2024-01-07: 510 mg via INTRAVENOUS
  Filled 2024-01-07: qty 510

## 2024-01-09 DIAGNOSIS — D500 Iron deficiency anemia secondary to blood loss (chronic): Secondary | ICD-10-CM | POA: Diagnosis not present

## 2024-01-09 DIAGNOSIS — E43 Unspecified severe protein-calorie malnutrition: Secondary | ICD-10-CM | POA: Diagnosis not present

## 2024-01-09 DIAGNOSIS — I5021 Acute systolic (congestive) heart failure: Secondary | ICD-10-CM | POA: Diagnosis not present

## 2024-01-09 DIAGNOSIS — N179 Acute kidney failure, unspecified: Secondary | ICD-10-CM | POA: Diagnosis not present

## 2024-01-09 DIAGNOSIS — D649 Anemia, unspecified: Secondary | ICD-10-CM | POA: Diagnosis not present

## 2024-01-14 ENCOUNTER — Other Ambulatory Visit

## 2024-01-17 ENCOUNTER — Encounter: Payer: Self-pay | Admitting: Hematology

## 2024-01-17 ENCOUNTER — Telehealth (HOSPITAL_COMMUNITY): Payer: Self-pay

## 2024-01-17 NOTE — Telephone Encounter (Signed)
 Called to confirm/remind patient of their appointment at the [facility] on 01/18/24.   Appointment:   [] Confirmed  [x] Left mess   [] No answer/No voice mail  [] VM Full/unable to leave message  [] Phone not in service  And to bring in all medications and/or complete list.

## 2024-01-18 ENCOUNTER — Ambulatory Visit (HOSPITAL_COMMUNITY): Payer: Self-pay | Admitting: Cardiology

## 2024-01-18 ENCOUNTER — Other Ambulatory Visit: Payer: Self-pay

## 2024-01-18 ENCOUNTER — Encounter (HOSPITAL_COMMUNITY): Payer: Self-pay

## 2024-01-18 ENCOUNTER — Other Ambulatory Visit (HOSPITAL_COMMUNITY): Payer: Self-pay

## 2024-01-18 ENCOUNTER — Ambulatory Visit (HOSPITAL_COMMUNITY)
Admission: RE | Admit: 2024-01-18 | Discharge: 2024-01-18 | Disposition: A | Discharge status: Home / Self Care | Source: Ambulatory Visit | Attending: Cardiology | Admitting: Cardiology

## 2024-01-18 VITALS — BP 130/82 | HR 87 | Ht 66.0 in | Wt 192.6 lb

## 2024-01-18 DIAGNOSIS — Z7984 Long term (current) use of oral hypoglycemic drugs: Secondary | ICD-10-CM | POA: Diagnosis not present

## 2024-01-18 DIAGNOSIS — I428 Other cardiomyopathies: Secondary | ICD-10-CM | POA: Insufficient documentation

## 2024-01-18 DIAGNOSIS — D649 Anemia, unspecified: Secondary | ICD-10-CM | POA: Insufficient documentation

## 2024-01-18 DIAGNOSIS — I5022 Chronic systolic (congestive) heart failure: Secondary | ICD-10-CM | POA: Insufficient documentation

## 2024-01-18 DIAGNOSIS — C182 Malignant neoplasm of ascending colon: Secondary | ICD-10-CM | POA: Diagnosis not present

## 2024-01-18 LAB — BASIC METABOLIC PANEL WITH GFR
Anion gap: 9 (ref 5–15)
BUN: 19 mg/dL (ref 8–23)
CO2: 26 mmol/L (ref 22–32)
Calcium: 9.5 mg/dL (ref 8.9–10.3)
Chloride: 104 mmol/L (ref 98–111)
Creatinine, Ser: 1.52 mg/dL — ABNORMAL HIGH (ref 0.44–1.00)
GFR, Estimated: 38 mL/min — ABNORMAL LOW (ref >60)
Glucose, Bld: 121 mg/dL — ABNORMAL HIGH (ref 70–99)
Potassium: 4.3 mmol/L (ref 3.5–5.1)
Sodium: 139 mmol/L (ref 135–145)

## 2024-01-18 LAB — DIGOXIN LEVEL: Digoxin Level: 1 ng/mL (ref 0.8–2.0)

## 2024-01-18 MED ORDER — LOSARTAN POTASSIUM 25 MG PO TABS
12.5000 mg | ORAL_TABLET | Freq: Every day | ORAL | 3 refills | Status: DC
  Filled 2024-01-18: qty 15, 30d supply, fill #0

## 2024-01-18 NOTE — Progress Notes (Signed)
 ADVANCED [facility] CLINIC CONSULT NOTE  Referring Physician: Collins Dean, NP Primary Care: Collins Dean, NP Primary Cardiologist: Sheryle Donning, MD  Chief Complaint: f/u for systolic heart failure   HPI:  Leah Hernandez is a 63 y.o. female with no previous past medical history, newly diagnosed with systolic heart failure, anemia and colon CA. She has been referred to the [facility] by the [facility] clinic for further management of systolic heart failure.    Admitted 5/25 with near-syncope. Hgb 5.1. Underwent EGD/colonoscopy showing transverse colon mass concerning for malignancy; s/p biopsy, and gastritis. Patholy showed invasive adenocarcinoma. General surgery was consulted to evaluate for hemicolectomy. Prior to surgery, she required cardiac evaluation. During hospitaliztion, she was noted to have several runs of VT. Subsequently, echocardiogram was ordered and showed EF 20-25%, global hypokinesis, G1DD, severe asymmetric LVH, RV normal. Cardiology consulted. Underwent R/LHC showing no CAD, stable filling pressures and preserved CI 3.6. She underwent successful R hemicolectomy on 12/18/23. Surgery was considered curative. HF GDMT titrated and she was discharged home, weight 193 lbs.  She returns today for f/u. Here w/ her daughter. Doing fairly well, though still w/ some exertional fatigue. Denies resting dyspnea. NYHA Class II. No LEE. Checking wt daily at home and stable. Compliant w/ meds. BP 130/82. No orthostatic symptoms.   Family Hx: mother died in her 55's of MI, sister died in 11's of MI     Past Medical History:  Diagnosis Date   Prediabetes     Current Outpatient Medications  Medication Sig Dispense Refill   dapagliflozin  propanediol (FARXIGA ) 10 MG TABS tablet Take 1 tablet (10 mg total) by mouth daily. 30 tablet 1   digoxin  (LANOXIN ) 0.125 MG tablet Take 1 tablet (0.125 mg total) by mouth daily. 30 tablet 3   furosemide  (LASIX ) 40 MG tablet Take 1 tablet  (40 mg total) by mouth daily as needed. 30 tablet 3   methocarbamol  (ROBAXIN ) 500 MG tablet Take 500 mg by mouth 3 (three) times daily.     metoprolol  succinate (TOPROL -XL) 100 MG 24 hr tablet Take 1 tablet (100 mg total) by mouth daily. Take with or immediately following a meal. 30 tablet 1   polyethylene glycol powder (GLYCOLAX /MIRALAX ) 17 GM/SCOOP powder Take 17 g by mouth daily. 238 g 0   spironolactone  (ALDACTONE ) 25 MG tablet Take 0.5 tablets (12.5 mg total) by mouth daily. 30 tablet 1   traMADol  (ULTRAM ) 50 MG tablet Take 1 tablet (50 mg total) by mouth every 6 (six) hours as needed for moderate pain (4-6). 20 tablet 0   acetaminophen  (TYLENOL ) 325 MG tablet Take 325-650 mg by mouth every 6 (six) hours as needed for mild pain (pain score 1-3) or moderate pain (pain score 4-6).     No current facility-administered medications for this encounter.    No Known Allergies    Social History   Socioeconomic History   Marital status: Married    Spouse name: Not on file   Number of children: 4   Years of education: Not on file   Highest education level: Not on file  Occupational History   Not on file  Tobacco Use   Smoking status: Never   Smokeless tobacco: Never  Vaping Use   Vaping status: Never Used  Substance and Sexual Activity   Alcohol use: No   Drug use: No   Sexual activity: Yes    Birth control/protection: Surgical  Other Topics Concern   Not on file  Social  History Narrative   Not on file   Social Drivers of Health   Financial Resource Strain: Not on file  Food Insecurity: Low Risk  (01/09/2024)   Received from [facility]   Hunger Vital Sign    Within the past 12 months, you worried that your food would run out before you got money to buy more: Never true    Within the past 12 months, the food you bought just didn't last and you didn't have money to get more. : Never true  Transportation Needs: No Transportation Needs (01/09/2024)   Received from [employer]    In the past 12 months, has lack of reliable transportation kept you from medical appointments, meetings, work or from getting things needed for daily living? : No  Physical Activity: Not on file  Stress: Not on file  Social Connections: Not on file  Intimate Partner Violence: Not At Risk (12/26/2023)   Humiliation, Afraid, Rape, and Kick questionnaire    Fear of Current or Ex-Partner: No    Emotionally Abused: No    Physically Abused: No    Sexually Abused: No      Family History  Problem Relation Age of Onset   Cancer Brother    Hypertension Neg Hx     Vitals:   01/18/24 1030  BP: 130/82  Pulse: 87  SpO2: 97%  Weight: 87.4 kg (192 lb 9.6 oz)  Height: 5' 6 (1.676 m)    PHYSICAL EXAM: General:  mildly fatigued appearing. No respiratory difficulty HEENT: normal Neck: supple. no JVD. Carotids 2+ bilat; no bruits. No lymphadenopathy or thryomegaly appreciated. Cor: PMI nondisplaced. Regular rate & rhythm. No rubs, gallops or murmurs. Lungs: clear Abdomen: soft, nontender, nondistended. No hepatosplenomegaly. No bruits or masses. Good bowel sounds. Extremities: no cyanosis, clubbing, rash, edema Neuro: alert & oriented x 3, cranial nerves grossly intact. moves all 4 extremities w/o difficulty. Affect pleasant.  ECG: not performed    ASSESSMENT & PLAN:  Chronic Systolic Heart Failure - NICM - Echo 5/25: EF 20-25%, normal RV  - R/LHC 5/25: no CAD, RHC with normal CI and filling pressures. - Unclear etiology, progressive symptoms x 2 months, could be from anemia. ? Familial given FH  - Check cMRI (2nd iron  infusion scheduled for 01/07/24, will need to wait 4-6 weeks after this for cMRI), scheduled 8/12  - NYHA II, functional status confounded by recent surgery/deconditioning. Euvolemic on exam  - Start Losartan 12.5 mg daily  - Continue spironolactone  12.5 mg daily. - Continue Farxiga  10 mg daily.  - Continue Toprol  XL 100 mg daily. - Continue digoxin   0.125 mg daily.  - Continue Lasix  PRN  - Check BMP and digoxin  level today  - Continue gradual med titration q 3 wks. See pharmD next visit  - will re quantify EF on cMRI scheduled 8/25     2. Anemia - Most recent Hgb 8.3 - Onc arranging iron  infusions   3. Colon Cancer  - path showed invasive adenocarcinoma - s/p R hemicolectomy 12/18/23. 28 lymph nodes neg - Surgery was considered curative, but she has high risk features that place her at moderate risk for recurrence.  F/u w/ PharmD in 3-4 wks for further GDMT titration. F/u w/ Dr. Alease Amend in August after completion of her cMRI     Ruddy Corral, PA-C

## 2024-01-18 NOTE — Patient Instructions (Signed)
 Great to see you today!!!  Medication Changes:  START Losartan 12.5 mg (1/2 tab) Daily  Lab Work:  Labs done today, your results will be available in MyChart, we will contact you for abnormal readings.   Special Instructions // Education:  Do the following things EVERYDAY: Weigh yourself in the morning before breakfast. Write it down and keep it in a log. Take your medicines as prescribed Eat low salt foods--Limit salt (sodium) to 2000 mg per day.  Stay as active as you can everyday Limit all fluids for the day to less than 2 liters   Follow-Up in:   Please follow up with our heart failure pharmacist in 3-4 weeks  Your physician recommends that you schedule a follow-up appointment in: 2-3 months with Dr Alease Amend   At the [facility], you and your health needs are our priority. We have a designated team specialized in the treatment of Heart Failure. This Care Team includes your primary Heart Failure Specialized Cardiologist (physician), Advanced Practice Providers (APPs- Physician Assistants and Nurse Practitioners), and Pharmacist who all work together to provide you with the care you need, when you need it.   You may see any of the following providers on your designated Care Team at your next follow up:  Dr. Jules Oar Dr. Peder Bourdon Dr. Alwin Baars Dr. Judyth Nunnery Nieves Bars, NP Ruddy Corral, [place] [facility] [place], [place] Dennise Fitz, NP [place] Lee, NP Luster Salters, PharmD   Please be sure to bring in all your medications bottles to every appointment.   Need to Contact Us :  If you have any questions or concerns before your next appointment please send us  a message through [facility] or call our office at [phone].    TO LEAVE A MESSAGE FOR THE NURSE SELECT OPTION 2, PLEASE LEAVE A MESSAGE INCLUDING: YOUR NAME DATE OF BIRTH CALL BACK NUMBER REASON FOR CALL**this is important as we prioritize the call backs  YOU WILL  RECEIVE A CALL BACK THE SAME DAY AS LONG AS YOU CALL BEFORE 4:00 PM

## 2024-01-21 ENCOUNTER — Other Ambulatory Visit (HOSPITAL_COMMUNITY): Payer: Self-pay | Admitting: Family Medicine

## 2024-01-21 ENCOUNTER — Other Ambulatory Visit (HOSPITAL_COMMUNITY): Payer: Self-pay

## 2024-01-21 LAB — GUARDANT REVEAL

## 2024-01-21 MED ORDER — FUROSEMIDE 40 MG PO TABS
40.0000 mg | ORAL_TABLET | Freq: Every day | ORAL | 3 refills | Status: DC | PRN
  Filled 2024-01-21: qty 30, 30d supply, fill #0

## 2024-01-21 MED ORDER — DIGOXIN 125 MCG PO TABS
0.1250 mg | ORAL_TABLET | Freq: Every day | ORAL | 3 refills | Status: DC
  Filled 2024-01-21: qty 30, 30d supply, fill #0
  Filled 2024-02-19: qty 30, 30d supply, fill #1
  Filled 2024-03-17: qty 30, 30d supply, fill #2

## 2024-01-22 ENCOUNTER — Inpatient Hospital Stay (HOSPITAL_BASED_OUTPATIENT_CLINIC_OR_DEPARTMENT_OTHER): Admitting: Nurse Practitioner

## 2024-01-22 DIAGNOSIS — C183 Malignant neoplasm of hepatic flexure: Secondary | ICD-10-CM

## 2024-01-22 NOTE — Progress Notes (Unsigned)
 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)   12/26/2023 Cancer Staging   Staging form: Colon and Rectum, AJCC 8th Edition - Pathologic: Stage IIA (pT3, pN0, cM0) - Signed by Ann Mayme POUR, NP on 12/26/2023 Stage prefix: Initial diagnosis Total positive nodes: 0 Histologic grading system: 4 grade system Histologic grade (G): G2      CURRENT THERAPY:   INTERVAL HISTORY   ROS   Past Medical History:  Diagnosis Date   Prediabetes      Past Surgical History:  Procedure Laterality Date   BIOPSY OF SKIN SUBCUTANEOUS TISSUE AND/OR MUCOUS MEMBRANE  12/14/2023   Procedure: BIOPSY, SKIN, SUBCUTANEOUS TISSUE, OR MUCOUS MEMBRANE;  Surgeon: Suzann Inocente HERO, MD;  Location: [facility] ENDOSCOPY;  Service: Gastroenterology;;   COLONOSCOPY N/A 12/14/2023   Procedure: COLONOSCOPY;  Surgeon: Suzann Inocente HERO, MD;  Location: [facility] ENDOSCOPY;  Service: Gastroenterology;  Laterality: N/A;   ESOPHAGOGASTRODUODENOSCOPY N/A 12/14/2023   Procedure: EGD (ESOPHAGOGASTRODUODENOSCOPY);  Surgeon: Suzann Inocente HERO, MD;  Location: [facility] ENDOSCOPY;  Service: Gastroenterology;  Laterality: N/A;   LAPAROSCOPIC PARTIAL COLECTOMY Right 12/18/2023   Procedure: LAPAROSCOPIC assisted  COLECTOMY;  Surgeon: Polly Cordella LABOR, MD;  Location: [facility] OR;  Service: General;  Laterality: Right;   POLYPECTOMY  12/14/2023   Procedure: POLYPECTOMY, INTESTINE;  Surgeon: Suzann Inocente HERO, MD;  Location: [facility] ENDOSCOPY;  Service: Gastroenterology;;   RIGHT/LEFT HEART CATH AND CORONARY ANGIOGRAPHY N/A 12/17/2023   Procedure: RIGHT/LEFT HEART CATH AND  CORONARY ANGIOGRAPHY;  Surgeon: Mady Lonni, MD;  Location: [facility] INVASIVE CV LAB;  Service: Cardiovascular;  Laterality: N/A;   SCLEROTHERAPY  12/14/2023   Procedure: SCLEROTHERAPY;  Surgeon: Suzann Inocente HERO, MD;  Location: [facility] ENDOSCOPY;  Service: Gastroenterology;;   TUBAL LIGATION       Outpatient Encounter Medications as of 01/22/2024  Medication Sig   acetaminophen  (TYLENOL ) 325 MG tablet Take 325-650 mg by mouth every 6 (six) hours as needed for mild pain (pain score 1-3) or moderate pain (pain score 4-6).   dapagliflozin  propanediol (FARXIGA ) 10 MG TABS tablet Take 1 tablet (10 mg total) by mouth daily.   digoxin  (LANOXIN ) 0.125 MG tablet Take 1 tablet (0.125 mg total) by mouth daily.   digoxin  (LANOXIN ) 0.125 MG tablet Take 1 tablet (0.125 mg total) by mouth daily.   furosemide  (LASIX ) 40 MG tablet Take 1 tablet (40 mg total) by mouth daily as needed.   furosemide  (LASIX ) 40 MG tablet Take 1 tablet (40 mg total) by mouth daily as needed.   losartan  (COZAAR ) 25 MG tablet Take 0.5 tablets (12.5 mg total) by mouth daily.   methocarbamol  (ROBAXIN ) 500 MG tablet Take 500 mg by mouth 3 (three) times daily.   metoprolol  succinate (TOPROL -XL) 100 MG 24 hr tablet Take 1 tablet (100 mg total) by mouth daily. Take with or immediately following a meal.   polyethylene glycol powder (GLYCOLAX /MIRALAX ) 17 GM/SCOOP powder Take 17 g by mouth daily.   spironolactone  (ALDACTONE ) 25 MG tablet Take 0.5 tablets (12.5 mg total) by mouth daily.   traMADol  (ULTRAM ) 50 MG tablet Take 1 tablet (50 mg total) by mouth  every 6 (six) hours as needed for moderate pain (4-6).   No facility-administered encounter medications on file as of 01/22/2024.     There were no vitals filed for this visit. There is no height or weight on file to calculate BMI.   ECOG PERFORMANCE STATUS: {CHL ONC ECOG PS:[id number]}  PHYSICAL EXAM GENERAL:alert, no distress and comfortable SKIN: no rash  EYES: sclera clear NECK: without  mass LYMPH:  no palpable cervical or supraclavicular lymphadenopathy  LUNGS: clear with normal breathing effort HEART: regular rate & rhythm, no lower extremity edema ABDOMEN: abdomen soft, non-tender and normal bowel sounds NEURO: alert & oriented x 3 with fluent speech, no focal motor/sensory deficits Breast exam:  PAC without erythema    CBC    Latest Ref Rng & Units 01/02/2024    9:38 AM 12/22/2023    3:04 AM 12/21/2023    3:13 AM  CBC  WBC 4.0 - 10.5 K/uL 3.9  6.5  8.1   Hemoglobin 12.0 - 15.0 g/dL 9.3  8.3  9.2   Hematocrit 36.0 - 46.0 % 30.1  27.4  29.6   Platelets 150 - 400 K/uL 339  391  390       CMP     Latest Ref Rng & Units 01/18/2024   11:20 AM 01/02/2024    9:38 AM 12/22/2023    3:04 AM  CMP  Glucose 70 - 99 mg/dL 878  897  93   BUN 8 - 23 mg/dL 19  11  14    Creatinine 0.44 - 1.00 mg/dL 8.47  8.92  8.74   Sodium 135 - 145 mmol/L 139  137  137   Potassium 3.5 - 5.1 mmol/L 4.3  4.5  3.8   Chloride 98 - 111 mmol/L 104  104  105   CO2 22 - 32 mmol/L 26  26  25    Calcium 8.9 - 10.3 mg/dL 9.5  9.5  8.9   Total Protein 6.5 - 8.1 g/dL  7.6    Total Bilirubin 0.0 - 1.2 mg/dL  0.4    Alkaline Phos 38 - 126 U/L  93    AST 15 - 41 U/L  16    ALT 0 - 44 U/L  8        ASSESSMENT & PLAN:  PLAN:  No orders of the defined types were placed in this encounter.     All questions were answered. The patient knows to call the clinic with any problems, questions or concerns. No barriers to learning were detected. I spent *** counseling the patient face to face. The total time spent in the appointment was *** and more than 50% was on counseling, review of test results, and coordination of care.   Aleksa Catterton K Tyquarius Paglia, NP 01/22/2024 9:06 AM

## 2024-01-23 ENCOUNTER — Encounter: Payer: Self-pay | Admitting: Nurse Practitioner

## 2024-01-23 ENCOUNTER — Other Ambulatory Visit (HOSPITAL_COMMUNITY): Payer: Self-pay

## 2024-01-23 DIAGNOSIS — I5022 Chronic systolic (congestive) heart failure: Secondary | ICD-10-CM

## 2024-01-23 MED ORDER — DIGOXIN 125 MCG PO TABS: 0.1250 mg | ORAL_TABLET | Freq: Every day | ORAL | 5 refills | Status: DC

## 2024-02-04 ENCOUNTER — Other Ambulatory Visit (HOSPITAL_COMMUNITY): Payer: Self-pay

## 2024-02-04 DIAGNOSIS — I5021 Acute systolic (congestive) heart failure: Secondary | ICD-10-CM | POA: Diagnosis not present

## 2024-02-04 DIAGNOSIS — N179 Acute kidney failure, unspecified: Secondary | ICD-10-CM | POA: Diagnosis not present

## 2024-02-04 DIAGNOSIS — C183 Malignant neoplasm of hepatic flexure: Secondary | ICD-10-CM | POA: Diagnosis not present

## 2024-02-04 DIAGNOSIS — Z13220 Encounter for screening for lipoid disorders: Secondary | ICD-10-CM | POA: Diagnosis not present

## 2024-02-04 DIAGNOSIS — I428 Other cardiomyopathies: Secondary | ICD-10-CM | POA: Diagnosis not present

## 2024-02-04 DIAGNOSIS — Z113 Encounter for screening for infections with a predominantly sexual mode of transmission: Secondary | ICD-10-CM | POA: Diagnosis not present

## 2024-02-04 MED ORDER — METOPROLOL SUCCINATE ER 100 MG PO TB24
100.0000 mg | ORAL_TABLET | Freq: Every day | ORAL | 0 refills | Status: DC
  Filled 2024-02-04: qty 30, 30d supply, fill #0

## 2024-02-04 MED ORDER — TRAMADOL HCL 50 MG PO TABS
50.0000 mg | ORAL_TABLET | Freq: Four times a day (QID) | ORAL | 0 refills | Status: DC | PRN
  Filled 2024-02-04: qty 20, 5d supply, fill #0

## 2024-02-04 MED ORDER — SPIRONOLACTONE 25 MG PO TABS
12.5000 mg | ORAL_TABLET | Freq: Every day | ORAL | 0 refills | Status: DC
  Filled 2024-02-04: qty 15, 30d supply, fill #0

## 2024-02-05 ENCOUNTER — Encounter (HOSPITAL_COMMUNITY): Payer: Self-pay | Admitting: *Deleted

## 2024-02-05 ENCOUNTER — Other Ambulatory Visit (HOSPITAL_COMMUNITY): Payer: Self-pay

## 2024-02-05 MED ORDER — DAPAGLIFLOZIN PROPANEDIOL 10 MG PO TABS
10.0000 mg | ORAL_TABLET | Freq: Every day | ORAL | 0 refills | Status: DC
  Filled 2024-02-05: qty 30, 30d supply, fill #0

## 2024-02-06 NOTE — Progress Notes (Incomplete)
 ***In Progress***    Advanced Heart Failure Clinic Note  Referring Physician: Theotis Haze ORN, NP Primary Care: Theotis Haze ORN, NP Primary Cardiologist: Shelda Bruckner, MD AHF Cardiologist: Dr. Zenaida  HPI:  Leah Hernandez is a 63 y.o. female with no previous past medical history, newly diagnosed with systolic heart failure, anemia and colon CA.   Admitted 11/2023 with near-syncope. Hgb was found to be 5.1. Underwent EGD/colonoscopy showing transverse colon mass concerning for malignancy; s/p biopsy, and gastritis. Pathology showed invasive adenocarcinoma. General surgery was consulted to evaluate for hemicolectomy. Prior to surgery, she required cardiac evaluation. During hospitaliztion, she was noted to have several runs of VT. Subsequently, echocardiogram was ordered and showed EF 20-25%, global hypokinesis, grade I DD, severe asymmetric LVH, RV normal. Cardiology consulted. Underwent R/LHC showing no CAD, stable filling pressures and preserved CI 3.6. She underwent successful R hemicolectomy on 12/18/23. Surgery was considered curative. At discharge she was taking metoprolol  succinate 100 mg daily, spironolactone  12.5 mg daily, Farxiga  10 mg daily. Weight was 193 lbs.  On 12/28/2023 she presented to clinic for follow up with APP. Overall felt fine. Not very active, taking her time since surgery, but no SOB with ADLs. Denied palpitations, abnormal bleeding, CP, dizziness, edema, or PND/Orthopnea. Appetite ok. Weight at home 187-191 pounds. Reports taking all medications. She was started on digoxin  0.125 mg daily, and furosemide  20 mg PRN. - note says to consider ivabradine at next visit if HR needs it - never mentions starting it but it was dispensed   On 01/18/2024 she returned for follow up. Stated she was doing fairly well, but still some exertional fatigue. Denied resting dyspnea. NYHA Class II. No LEE. Weight was stable at home. States she has been compliant with medications. BP  130/82. Denied orthostatic symptoms. She was started on losartan  12.5 mg daily.  No mention of ivabradine at all during this visit   Today he returns to HF clinic for pharmacist medication titration. At last visit with MD ***.   Overall feeling ***. Dizziness, lightheadedness, fatigue:  Chest pain or palpitations:  How is your breathing?: *** SOB: Able to complete all ADLs. Activity level ***  Weight at home pounds. Takes furosemide /torsemide/bumex *** mg *** daily.  LEE PND/Orthopnea  Appetite *** Low-salt diet:   Physical Exam Cost/affordability of meds   HF Medications: Metoprolol  succinate 100 mg daily. Losartan  12.5 mg daily  Spironolactone  12.5 mg daily. Farxiga  10 mg daily.  Digoxin  0.125 mg daily.  Ivabradine 5 mg BID dispensed 12/28/23 Furosemide  20 mg PRN.   Has the patient been experiencing any side effects to the medications prescribed?  {YES NO:22349}  Does the patient have any problems obtaining medications due to transportation or finances?   {YES NO:22349}  Understanding of regimen: {excellent/good/fair/poor:19665} Understanding of indications: {excellent/good/fair/poor:19665} Potential of compliance: {excellent/good/fair/poor:19665} Patient understands to avoid NSAIDs. Patient understands to avoid decongestants.    Pertinent Lab Values: 01/18/2024: Serum creatinine 1.52, BUN 19, Potassium 4.3, Sodium 139  Vital Signs: Weight: *** lbs (last clinic weight: 192.6 lbs) Blood pressure: *** mmHg Heart rate: *** bpm  Assessment/Plan: Chronic Systolic Heart Failure - NICM - Echo 5/25: EF 20-25%, normal RV  - R/LHC 5/25: no CAD, RHC with normal CI and filling pressures. - NYHA ***, functional status confounded by recent surgery/deconditioning. Volume status *** - Continue furosemide  20 mg PRN.  - Continue metoprolol  succinate 100 mg daily. - Continue losartan  12.5 mg daily  - Continue spironolactone  12.5 mg daily. - Continue Farxiga  10  mg daily.  -  Continue digoxin  0.125 mg daily.    2. Anemia - Most recent Hgb 9.3 on 01/02/2024 - Oncology arranging iron  infusions   3. Colon Cancer  - Pathology showed invasive adenocarcinoma - s/p hemicolectomy 12/18/23. 28 lymph nodes neg - Surgery was considered curative, but she has high risk features that place her at moderate risk for recurrence.  Follow up ***   Tinnie Redman, PharmD, BCPS, BCCP, CPP Heart Failure Clinic Pharmacist [phone]

## 2024-02-07 ENCOUNTER — Ambulatory Visit (HOSPITAL_COMMUNITY)
Admission: RE | Admit: 2024-02-07 | Discharge: 2024-02-07 | Disposition: A | Discharge status: Home / Self Care | Source: Ambulatory Visit | Attending: Cardiology | Admitting: Cardiology

## 2024-02-07 ENCOUNTER — Other Ambulatory Visit (HOSPITAL_COMMUNITY): Payer: Self-pay

## 2024-02-07 VITALS — BP 130/84 | HR 88 | Wt 198.0 lb

## 2024-02-07 DIAGNOSIS — T500X6A Underdosing of mineralocorticoids and their antagonists, initial encounter: Secondary | ICD-10-CM | POA: Diagnosis not present

## 2024-02-07 DIAGNOSIS — Z79899 Other long term (current) drug therapy: Secondary | ICD-10-CM | POA: Diagnosis not present

## 2024-02-07 DIAGNOSIS — D649 Anemia, unspecified: Secondary | ICD-10-CM | POA: Diagnosis not present

## 2024-02-07 DIAGNOSIS — T465X6A Underdosing of other antihypertensive drugs, initial encounter: Secondary | ICD-10-CM | POA: Diagnosis not present

## 2024-02-07 DIAGNOSIS — I5022 Chronic systolic (congestive) heart failure: Secondary | ICD-10-CM | POA: Diagnosis not present

## 2024-02-07 DIAGNOSIS — C184 Malignant neoplasm of transverse colon: Secondary | ICD-10-CM | POA: Insufficient documentation

## 2024-02-07 DIAGNOSIS — Z91148 Patient's other noncompliance with medication regimen for other reason: Secondary | ICD-10-CM | POA: Diagnosis not present

## 2024-02-07 DIAGNOSIS — I5020 Unspecified systolic (congestive) heart failure: Secondary | ICD-10-CM | POA: Diagnosis present

## 2024-02-07 MED ORDER — FUROSEMIDE 40 MG PO TABS
20.0000 mg | ORAL_TABLET | Freq: Every day | ORAL | 5 refills | Status: DC
  Filled 2024-02-07 – 2024-02-19 (×2): qty 30, 60d supply, fill #0
  Filled 2024-03-05 – 2024-03-17 (×2): qty 30, 60d supply, fill #1

## 2024-02-07 MED ORDER — LOSARTAN POTASSIUM 25 MG PO TABS
12.5000 mg | ORAL_TABLET | Freq: Every day | ORAL | 5 refills | Status: DC
  Filled 2024-02-07: qty 15, 30d supply, fill #0

## 2024-02-07 MED ORDER — METOPROLOL SUCCINATE ER 100 MG PO TB24
150.0000 mg | ORAL_TABLET | Freq: Every day | ORAL | 5 refills | Status: DC
  Filled 2024-02-07 – 2024-03-17 (×3): qty 45, 30d supply, fill #0

## 2024-02-07 MED ORDER — SPIRONOLACTONE 25 MG PO TABS
12.5000 mg | ORAL_TABLET | Freq: Every day | ORAL | 5 refills | Status: DC
  Filled 2024-02-07 (×2): qty 15, 30d supply, fill #0
  Filled 2024-02-19 – 2024-03-03 (×2): qty 15, 30d supply, fill #1
  Filled 2024-03-17 – 2024-04-01 (×2): qty 15, 30d supply, fill #2

## 2024-02-07 NOTE — Progress Notes (Signed)
 Advanced Heart Failure Clinic Note   Referring Physician: Theotis Haze ORN, NP Primary Care: Theotis Haze ORN, NP Primary Cardiologist: Shelda Bruckner, MD AHF Cardiologist: Dr. Zenaida  HPI:  Leah Hernandez is a 63 y.o. female with no previous past medical history, newly diagnosed with systolic heart failure, anemia and colon CA.   Admitted 11/2023 with near-syncope. Hgb was found to be 5.1. Underwent EGD/colonoscopy showing transverse colon mass concerning for malignancy; s/p biopsy, and gastritis. Pathology showed invasive adenocarcinoma. General surgery was consulted to evaluate for hemicolectomy. Prior to surgery, she required cardiac evaluation. During hospitaliztion, she was noted to have several runs of VT. Subsequently, echocardiogram was ordered and showed EF 20-25%, global hypokinesis, grade I DD, severe asymmetric LVH, RV normal. Cardiology consulted. Underwent R/LHC showing no CAD, stable filling pressures and preserved CI 3.6. She underwent successful R hemicolectomy on 12/18/23. Surgery was considered curative. At discharge she was taking metoprolol  succinate 100 mg daily, spironolactone  12.5 mg daily, Farxiga  10 mg daily. Weight was 193 lbs.  On 12/28/2023 she presented to clinic for follow up with APP. Overall felt fine. Not very active, taking her time since surgery, but no SOB with ADLs. Denied palpitations, abnormal bleeding, CP, dizziness, edema, or PND/Orthopnea. Appetite ok. Weight at home 187-191 pounds. Reports taking all medications. She was started on digoxin  0.125 mg daily, and furosemide  20 mg PRN.  On 01/18/2024 she returned for follow up. Stated she was doing fairly well, but still some exertional fatigue. Denied resting dyspnea. NYHA Class II. No LEE. Weight was stable at home. States she has been compliant with medications. BP 130/82. Denied orthostatic symptoms. She was started on losartan  12.5 mg daily.   Today she returns to HF clinic for pharmacist  medication titration. At last visit with APP, she was started on losartan  12.5 mg daily. She reports feeling a lot better overall. Denies dizziness, and lightheadedness. Says that about an hour after taking morning medications she is a little drowsy and tired. Denies chest pain. Reports that when she is is moving around too fast she does feel her heart beating faster. Denies SOB. States that she was able to walk around the grocery store the other day without having to stop at all. Her weight at home has been ~ 194.2 lbs. Continues to take furosemide  40 mg daily (instead of PRN) as she is anxious about not taking it every day. Has not noticed any swelling, and no LEE today. Denies PND/orthopnea. Her appetite has been good, tries to avoid salt as much as possible. She mentions that she had been taking spironolactone  25 mg daily because she was not aware she had to split the tablet until she went for a refill and it was told it was too early. She stopped taking losartan  because she was not aware that the prescription had refills. Was planning to get BMET and digoxin  level today, but was unable to as hospital lab system was down. She is agreeable to returning in one week to get labs. Was told to hold digoxin  dose morning of lab visit.   HF Medications: Metoprolol  succinate 100 mg daily. Losartan  12.5 mg daily  Spironolactone  12.5 mg daily  Farxiga  10 mg daily.  Digoxin  0.125 mg daily.  Furosemide  40 mg PRN - Patient reports she has been taking daily  Has the patient been experiencing any side effects to the medications prescribed?  No  Does the patient have any problems obtaining medications due to transportation or finances?  No. Has [place] Medicaid [employer].  Understanding of regimen: good Understanding of indications: good Potential of compliance: good Patient understands to avoid NSAIDs. Patient understands to avoid decongestants.    Pertinent Lab Values: 01/18/2024: Serum creatinine 1.52, BUN  19, Potassium 4.3, Sodium 139 BMET and digoxin  wanted today but lab was down during visit. Will come back in 1 week.   Vital Signs: Weight: 198 lbs (last clinic weight: 192.6 lbs) Blood pressure: 130/84 mmHg Heart rate: 88 bpm  Assessment/Plan: Chronic Systolic Heart Failure - NICM - Echo 5/25: EF 20-25%, normal RV  - R/LHC 5/25: no CAD, RHC with normal CI and filling pressures. - NYHA II. Appears euvolemic on exam. - Decrease furosemide  to 20 mg daily. If Scr remains elevated on repeat labs, would decrease to PRN only. Explained the importance of this.  - Increase metoprolol  succinate to 150 mg daily. - Continue losartan  12.5 mg daily. - Continue spironolactone  12.5 mg daily. - Continue Farxiga  10 mg daily.  - Continue digoxin  0.125 mg daily.    2. Anemia - Most recent Hgb 9.3 on 01/02/2024 - Oncology arranging iron  infusions   3. Colon Cancer  - Pathology showed invasive adenocarcinoma - s/p hemicolectomy 12/18/23. 28 lymph nodes neg - Surgery was considered curative, but she has high risk features that place her at moderate risk for recurrence.  Return to clinic in 1 week for labs. Follow up Dr. Zenaida in 7 weeks.    Tinnie Redman, PharmD, BCPS, BCCP, CPP Heart Failure Clinic Pharmacist [phone]

## 2024-02-07 NOTE — Patient Instructions (Addendum)
 It was a pleasure seeing you today!  MEDICATIONS: -We are changing your medications today -Decrease Lasix  (furosemide  ) to 20 mg (1/2 tablet) daily -Increase metoprolol  succinate to 150 mg (1.5 tablets) daily -Call if you have questions about your medications.   NEXT APPOINTMENT: Return for lab visit in 1 week. Please wait to take your digoxin  until after they draw your labs so we can get an accurate level.  Return to clinic in 7 weeks with Dr. Zenaida.  In general, to take care of your heart failure: -Limit your fluid intake to 2 Liters (half-gallon) per day.   -Limit your salt intake to ideally 2-3 grams (2000-3000 mg) per day. -Weigh yourself daily and record, and bring that weight diary to your next appointment.  (Weight gain of 2-3 pounds in 1 day typically means fluid weight.) -The medications for your heart are to help your heart and help you live longer.   -Please contact us  before stopping any of your heart medications.  Call the clinic at [phone] with questions or to reschedule future appointments.

## 2024-02-11 ENCOUNTER — Telehealth: Admitting: Nurse Practitioner

## 2024-02-12 ENCOUNTER — Other Ambulatory Visit: Payer: Self-pay

## 2024-02-12 ENCOUNTER — Ambulatory Visit (HOSPITAL_COMMUNITY)
Admission: RE | Admit: 2024-02-12 | Discharge: 2024-02-12 | Disposition: A | Discharge status: Home / Self Care | Source: Ambulatory Visit | Attending: Cardiology | Admitting: Cardiology

## 2024-02-12 ENCOUNTER — Other Ambulatory Visit (HOSPITAL_COMMUNITY): Payer: Self-pay

## 2024-02-12 ENCOUNTER — Ambulatory Visit (HOSPITAL_COMMUNITY): Payer: Self-pay | Admitting: Cardiology

## 2024-02-12 DIAGNOSIS — D500 Iron deficiency anemia secondary to blood loss (chronic): Secondary | ICD-10-CM | POA: Diagnosis not present

## 2024-02-12 DIAGNOSIS — N1831 Chronic kidney disease, stage 3a: Secondary | ICD-10-CM | POA: Diagnosis not present

## 2024-02-12 DIAGNOSIS — I5022 Chronic systolic (congestive) heart failure: Secondary | ICD-10-CM | POA: Diagnosis not present

## 2024-02-12 DIAGNOSIS — R7303 Prediabetes: Secondary | ICD-10-CM | POA: Diagnosis not present

## 2024-02-12 DIAGNOSIS — I428 Other cardiomyopathies: Secondary | ICD-10-CM | POA: Diagnosis not present

## 2024-02-12 DIAGNOSIS — D649 Anemia, unspecified: Secondary | ICD-10-CM | POA: Diagnosis not present

## 2024-02-12 LAB — BASIC METABOLIC PANEL WITH GFR
Anion gap: 10 (ref 5–15)
BUN: 12 mg/dL (ref 8–23)
CO2: 26 mmol/L (ref 22–32)
Calcium: 10 mg/dL (ref 8.9–10.3)
Chloride: 103 mmol/L (ref 98–111)
Creatinine, Ser: 1.21 mg/dL — ABNORMAL HIGH (ref 0.44–1.00)
GFR, Estimated: 50 mL/min — ABNORMAL LOW (ref >60)
Glucose, Bld: 103 mg/dL — ABNORMAL HIGH (ref 70–99)
Potassium: 4.6 mmol/L (ref 3.5–5.1)
Sodium: 139 mmol/L (ref 135–145)

## 2024-02-12 LAB — DIGOXIN LEVEL: Digoxin Level: 0.7 ng/mL — ABNORMAL LOW (ref 0.8–2.0)

## 2024-02-12 MED ORDER — LOSARTAN POTASSIUM 25 MG PO TABS
12.5000 mg | ORAL_TABLET | Freq: Every day | ORAL | 1 refills | Status: DC
  Filled 2024-02-12: qty 45, 90d supply, fill #0
  Filled 2024-03-17: qty 45, 90d supply, fill #1

## 2024-02-20 ENCOUNTER — Other Ambulatory Visit: Payer: Self-pay

## 2024-02-20 ENCOUNTER — Other Ambulatory Visit (HOSPITAL_COMMUNITY): Payer: Self-pay

## 2024-02-26 ENCOUNTER — Other Ambulatory Visit (HOSPITAL_COMMUNITY): Payer: Self-pay

## 2024-02-26 ENCOUNTER — Other Ambulatory Visit: Payer: Self-pay

## 2024-02-26 MED ORDER — FARXIGA 10 MG PO TABS
10.0000 mg | ORAL_TABLET | Freq: Every day | ORAL | 2 refills | Status: DC
  Filled 2024-03-05: qty 30, 30d supply, fill #0
  Filled 2024-03-17: qty 30, 30d supply, fill #1

## 2024-02-26 MED ORDER — DAPAGLIFLOZIN PROPANEDIOL 10 MG PO TABS
10.0000 mg | ORAL_TABLET | Freq: Every day | ORAL | 0 refills | Status: DC
  Filled 2024-04-01: qty 30, 30d supply, fill #0

## 2024-03-05 ENCOUNTER — Other Ambulatory Visit: Payer: Self-pay

## 2024-03-06 ENCOUNTER — Other Ambulatory Visit: Payer: Self-pay

## 2024-03-07 ENCOUNTER — Encounter (HOSPITAL_COMMUNITY): Payer: Self-pay

## 2024-03-11 ENCOUNTER — Other Ambulatory Visit (HOSPITAL_COMMUNITY): Payer: Self-pay | Admitting: Family Medicine

## 2024-03-11 ENCOUNTER — Ambulatory Visit (HOSPITAL_COMMUNITY)
Admission: RE | Admit: 2024-03-11 | Discharge: 2024-03-11 | Disposition: A | Discharge status: Home / Self Care | Source: Ambulatory Visit | Attending: Family Medicine | Admitting: Family Medicine

## 2024-03-11 DIAGNOSIS — I5022 Chronic systolic (congestive) heart failure: Secondary | ICD-10-CM

## 2024-03-11 MED ORDER — GADOBUTROL 1 MMOL/ML IV SOLN: 10.0000 mL | Freq: Once | INTRAVENOUS | Status: DC | PRN

## 2024-03-11 MED ORDER — GADOBUTROL 1 MMOL/ML IV SOLN
10.0000 mL | Freq: Once | INTRAVENOUS | Status: AC | PRN
  Administered 2024-03-11 (×2): 10 mL via INTRAVENOUS

## 2024-03-17 ENCOUNTER — Other Ambulatory Visit (HOSPITAL_COMMUNITY): Payer: Self-pay

## 2024-03-17 ENCOUNTER — Ambulatory Visit (HOSPITAL_COMMUNITY): Payer: Self-pay | Admitting: Family Medicine

## 2024-03-17 ENCOUNTER — Other Ambulatory Visit: Payer: Self-pay

## 2024-03-18 ENCOUNTER — Other Ambulatory Visit: Payer: Self-pay

## 2024-03-20 ENCOUNTER — Telehealth (HOSPITAL_COMMUNITY): Payer: Self-pay

## 2024-03-20 NOTE — Telephone Encounter (Signed)
 Received a fax requesting medical records from [employer]. Records were successfully faxed to: [phone] ,which was the number provided.. Medical request form will be scanned into patients chart.   Phone Number: [phone]

## 2024-03-21 ENCOUNTER — Telehealth (HOSPITAL_COMMUNITY): Payer: Self-pay | Admitting: Cardiology

## 2024-03-21 NOTE — Telephone Encounter (Signed)
 Called to confirm/remind patient of their appointment at the Advanced Heart Failure Clinic on 03/21/24.  03/21/24 Appointment:   [] Confirmed  [x] Left mess   [] No answer/No voice mail  [] VM Full/unable to leave message  [] Phone not in service  Patient reminded to bring all medications and/or complete list.  Confirmed patient has transportation. Gave directions, instructed to utilize valet parking.

## 2024-03-24 ENCOUNTER — Ambulatory Visit (HOSPITAL_COMMUNITY)
Admission: RE | Admit: 2024-03-24 | Discharge: 2024-03-24 | Disposition: A | Discharge status: Home / Self Care | Source: Ambulatory Visit | Attending: Cardiology | Admitting: Cardiology

## 2024-03-24 ENCOUNTER — Encounter (HOSPITAL_COMMUNITY): Payer: Self-pay | Admitting: Cardiology

## 2024-03-24 ENCOUNTER — Other Ambulatory Visit (HOSPITAL_COMMUNITY): Payer: Self-pay

## 2024-03-24 ENCOUNTER — Ambulatory Visit (HOSPITAL_BASED_OUTPATIENT_CLINIC_OR_DEPARTMENT_OTHER)
Admission: RE | Admit: 2024-03-24 | Discharge: 2024-03-24 | Disposition: A | Discharge status: Home / Self Care | Source: Ambulatory Visit | Attending: Cardiology | Admitting: Cardiology

## 2024-03-24 VITALS — BP 118/64 | HR 70 | Ht 66.0 in | Wt 203.0 lb

## 2024-03-24 DIAGNOSIS — D509 Iron deficiency anemia, unspecified: Secondary | ICD-10-CM

## 2024-03-24 DIAGNOSIS — I5022 Chronic systolic (congestive) heart failure: Secondary | ICD-10-CM | POA: Insufficient documentation

## 2024-03-24 DIAGNOSIS — C189 Malignant neoplasm of colon, unspecified: Secondary | ICD-10-CM | POA: Diagnosis not present

## 2024-03-24 DIAGNOSIS — I340 Nonrheumatic mitral (valve) insufficiency: Secondary | ICD-10-CM | POA: Diagnosis not present

## 2024-03-24 LAB — ECHOCARDIOGRAM COMPLETE
Area-P 1/2: 4.01 cm2
Calc EF: 37 %
S' Lateral: 4.9 cm
Single Plane A2C EF: 31.7 %
Single Plane A4C EF: 39.9 %

## 2024-03-24 MED ORDER — LOSARTAN POTASSIUM 25 MG PO TABS
25.0000 mg | ORAL_TABLET | Freq: Every day | ORAL | 3 refills | Status: DC
Start: 2024-03-24 — End: 2024-04-14
  Filled 2024-03-24 – 2024-04-01 (×2): qty 90, 90d supply, fill #0

## 2024-03-24 NOTE — Patient Instructions (Signed)
 STOP Digoxin   INCREASE Losartan  to 25 mg daily.  You have been referred to Cardiac Rehab. They will call you to arrange your appointment.  Please follow up with our heart failure pharmacist as scheduled.  Your physician recommends that you schedule a follow-up appointment in: 2 months ( October) ** PLEASE CALL THE OFFICE IN 3 WEEKS TO ARRANGE YOUR FOLLOW UP APPOINTMENT.**  If you have any questions or concerns before your next appointment please send us  a message through [facility] or call our office at [phone].    TO LEAVE A MESSAGE FOR THE NURSE SELECT OPTION 2, PLEASE LEAVE A MESSAGE INCLUDING: YOUR NAME DATE OF BIRTH CALL BACK NUMBER REASON FOR CALL**this is important as we prioritize the call backs  YOU WILL RECEIVE A CALL BACK THE SAME DAY AS LONG AS YOU CALL BEFORE 4:00 PM  At the Advanced Heart Failure Clinic, you and your health needs are our priority. As part of our continuing mission to provide you with exceptional heart care, we have created designated Provider Care Teams. These Care Teams include your primary Cardiologist (physician) and Advanced Practice Providers (APPs- Physician Assistants and Nurse Practitioners) who all work together to provide you with the care you need, when you need it.   You may see any of the following providers on your designated Care Team at your next follow up: Dr Toribio Fuel Dr Ezra Shuck Dr. Ria Commander Dr. [name] Amy Lenetta, NP Caffie Shed, [place] [facility] [place], [place] Beckey Coe, NP [place] Lee, NP Ellouise Class, NP Tinnie Redman, PharmD Jaun Bash, PharmD   Please be sure to bring in all your medications bottles to every appointment.    Thank you for choosing [facility] Failure [facility]

## 2024-03-24 NOTE — Progress Notes (Signed)
   ADVANCED HEART FAILURE FOLLOW UP CLINIC NOTE  Referring Physician: Theotis Haze ORN, NP  Primary Care: Theotis Haze ORN, NP Primary Cardiologist:  HPI: Leah Hernandez is a 63 y.o. female who presents for follow up of chronic systolic heart failure.      Admitted 11/2023 with near-syncope. Hgb was found to be 5.1. Underwent EGD/colonoscopy showing transverse colon mass concerning for malignancy; s/p biopsy, and gastritis. Pathology showed invasive adenocarcinoma. General surgery was consulted to evaluate for hemicolectomy. Prior to surgery, she required cardiac evaluation. During hospitaliztion, she was noted to have several runs of VT. Subsequently, echocardiogram was ordered and showed EF 20-25%, global hypokinesis, grade I DD, severe asymmetric LVH, RV normal. Cardiology consulted. Underwent R/LHC showing no CAD, stable filling pressures and preserved CI 3.6. She underwent successful R hemicolectomy on 12/18/23      SUBJECTIVE:  Overall doing well, weight is up mildly but no symptoms of worsening shortness of breath or heart failure. She denies any chest pain, leg swelling, orthopnea. She is taking all her medications without issues. Denies dizziness or lightheadedness.   PMH, current medications, allergies, social history, and family history reviewed in epic.  PHYSICAL EXAM: Vitals:   03/24/24 1154  BP: 118/64  Pulse: 70  SpO2: 100%   GENERAL: Well nourished and in no apparent distress at rest.  PULM:  Normal work of breathing, clear to auscultation bilaterally. Respirations are unlabored.  CARDIAC:  JVP: flat         Normal rate with regular rhythm. No murmurs, rubs or gallops.  Trace edema. Warm and well perfused extremities. ABDOMEN: Soft, non-tender, non-distended. NEUROLOGIC: Patient is oriented x3 with no focal or lateralizing neurologic deficits.    DATA REVIEW  ECG: 12/28/2023: NSR    ECHO: Echo 5/25: EF 20-25%, normal RV    CATH: R/LHC 5/25: no CAD, RHC  with normal CI and filling pressures.   CMR: Normal LV size, mild LVH, thinning of the mid inferior and inferolatearl wall segments, otherwise hypokinesis. LVEF 30%, RV function 42%, normal ECV   ASSESSMENT & PLAN:  Chronic Systolic Heart Failure: Unclear etiology. Mild LGE in the mid inferior and inferolateral walls consistent with infarct, but on review of coronary angiogram there is no significant disease (though poorly visualized anomalous circ). Discussion today about ICD placement. Given minimal LGE, room to titrate GDMT, patient would prefer to continue titration prior to ICD consideration, which is reasonable. - NICM primarily - Decrease lasix  to 20mg  prn - Continue metoprolol  succinate 150mg  daily - Increase losartan  to 25mg  daily - Continue farxiga  10mg  daily - Stop digoxin  - Continue med titration - Continue spironolactone  12.5 mg daily.  Anemia - Most recent Hgb 11.5 on 01/02/2024 - Oncology arranging iron  infusions   Colon Cancer  - Pathology showed invasive adenocarcinoma - s/p hemicolectomy 12/18/23. 28 lymph nodes neg - Surgery was considered curative, but she has high risk features that place her at moderate risk for recurrence.    Follow up in 2-3 months  [name], MD Advanced Heart Failure Mechanical Circulatory Support 03/28/24

## 2024-03-26 ENCOUNTER — Encounter (HOSPITAL_COMMUNITY): Payer: Self-pay

## 2024-04-01 ENCOUNTER — Other Ambulatory Visit (HOSPITAL_COMMUNITY): Payer: Self-pay

## 2024-04-01 ENCOUNTER — Other Ambulatory Visit: Payer: Self-pay

## 2024-04-02 ENCOUNTER — Other Ambulatory Visit: Payer: Self-pay

## 2024-04-03 ENCOUNTER — Other Ambulatory Visit: Payer: Self-pay

## 2024-04-04 ENCOUNTER — Telehealth (HOSPITAL_COMMUNITY): Payer: Self-pay

## 2024-04-04 NOTE — Telephone Encounter (Signed)
 Re-faxed Medicaid form to Dr. Starla office at both locations.

## 2024-04-13 NOTE — Progress Notes (Incomplete)
***  In Progress***    Advanced Heart Failure Clinic Note   Referring Physician: Theotis Haze ORN, NP  Primary Care: Theotis Haze ORN, NP Primary Cardiologist:  HPI:  Leah Hernandez is a 63 y.o. female who presents for follow up of chronic systolic heart failure.   Admitted 11/2023 with near-syncope. Hgb was found to be 5.1 g/dL. Underwent EGD/colonoscopy showing transverse colon mass concerning for malignancy; s/p biopsy, and gastritis. Pathology showed invasive adenocarcinoma. General surgery was consulted to evaluate for hemicolectomy. Prior to surgery, she required cardiac evaluation. During hospitaliztion, she was noted to have several runs of VT. Subsequently, echocardiogram was ordered and showed EF 20-25%, global hypokinesis, grade I DD, severe asymmetric LVH, RV normal. Cardiology consulted. Underwent R/LHC showing no CAD, stable filling pressures and preserved CI 3.6. She underwent successful R hemicolectomy on 12/18/23.   Presented to AHF clinic for MD visit on 03/24/24. Weight was up mildly but no symptoms of worsening shortness of breath or heart failure. She denied any chest pain, leg swelling, orthopnea. She reported taking all her medications without issues. Denied dizziness or lightheadedness.   Today he returns to HF clinic for pharmacist medication titration. At last visit with MD, furosemide  was decreased to prn, losartan  was increased to 25 mg daily, and digoxin  was discontinued. ***   Shortness of breath/dyspnea on exertion? {YES NO:22349}  Orthopnea/PND? {YES NO:22349} Edema? {YES NO:22349} Lightheadedness/dizziness? {YES NO:22349} Daily weights at home? {YES NO:22349} Blood pressure/heart rate monitoring at home? {YES [id number] Following low-sodium/fluid-restricted diet? {YES NO:22349}  HF Medications: Metoprolol  succinate 150 mg daily *** Losartan  25 mg daily *** Spironolactone  12.5 mg daily *** Farxiga  10 mg daily ***  Furosemide  20 mg prn ***  Has the patient  been experiencing any side effects to the medications prescribed?  {YES NO:22349}  Does the patient have any problems obtaining medications due to transportation or finances?   {YES NO:22349}  Understanding of regimen: {excellent/good/fair/poor:19665} Understanding of indications: {excellent/good/fair/poor:19665} Potential of compliance: {excellent/good/fair/poor:19665} Patient understands to avoid NSAIDs. Patient understands to avoid decongestants.    Pertinent Lab Values: (02/12/24) Serum creatinine 1.33 mg/dL, BUN 15 mg/dL, Potassium 4.3 mmol/L, Sodium 138 mmol/L, BNP 65 pg/mL (01/09/24)  Vital Signs: Weight: *** (last clinic weight: 203 lbs) Blood pressure: *** (last clinic BP: 118/64 mmHg) *** Heart rate: *** (last clinic HR: 70 bpm) ***  Assessment/Plan: Chronic Systolic Heart Failure: Unclear etiology. Mild LGE in the mid inferior and inferolateral walls consistent with infarct, but on review of coronary angiogram there is no significant disease (though poorly visualized anomalous circ). Discussion today about ICD placement. Given minimal LGE, room to titrate GDMT, patient would prefer to continue titration prior to ICD consideration, which is reasonable. - NICM primarily - Decrease lasix  to 20mg  prn *** - Continue metoprolol  succinate 150mg  daily *** - Increase losartan  to 25mg  daily *** - Continue farxiga  10mg  daily *** - Stop digoxin  ***  - Continue med titration *** - Continue spironolactone  12.5 mg daily. ***   Anemia - Most recent Hgb 11.5 on 01/02/2024 *** - Oncology arranging iron  infusions   Colon Cancer  - Pathology showed invasive adenocarcinoma - s/p hemicolectomy 12/18/23. 28 lymph nodes neg - Surgery was considered curative, but she has high risk features that place her at moderate risk for recurrence.  Follow up ***  Morna Breach, PharmD PGY2 Cardiology Pharmacy Resident

## 2024-04-14 ENCOUNTER — Other Ambulatory Visit: Payer: Self-pay

## 2024-04-14 ENCOUNTER — Ambulatory Visit (HOSPITAL_COMMUNITY)
Admission: RE | Admit: 2024-04-14 | Discharge: 2024-04-14 | Disposition: A | Discharge status: Home / Self Care | Source: Ambulatory Visit | Attending: Internal Medicine | Admitting: Internal Medicine

## 2024-04-14 ENCOUNTER — Other Ambulatory Visit: Payer: Self-pay | Admitting: Nurse Practitioner

## 2024-04-14 ENCOUNTER — Other Ambulatory Visit (HOSPITAL_COMMUNITY): Payer: Self-pay

## 2024-04-14 ENCOUNTER — Encounter (HOSPITAL_COMMUNITY): Payer: Self-pay

## 2024-04-14 VITALS — BP 108/78 | HR 77 | Wt 202.6 lb

## 2024-04-14 DIAGNOSIS — I5022 Chronic systolic (congestive) heart failure: Secondary | ICD-10-CM | POA: Diagnosis not present

## 2024-04-14 DIAGNOSIS — C184 Malignant neoplasm of transverse colon: Secondary | ICD-10-CM | POA: Diagnosis not present

## 2024-04-14 DIAGNOSIS — D649 Anemia, unspecified: Secondary | ICD-10-CM | POA: Insufficient documentation

## 2024-04-14 DIAGNOSIS — Z79899 Other long term (current) drug therapy: Secondary | ICD-10-CM | POA: Insufficient documentation

## 2024-04-14 DIAGNOSIS — C183 Malignant neoplasm of hepatic flexure: Secondary | ICD-10-CM

## 2024-04-14 MED ORDER — SPIRONOLACTONE 25 MG PO TABS
25.0000 mg | ORAL_TABLET | Freq: Every day | ORAL | 5 refills | Status: AC
  Filled 2024-04-14 – 2024-05-02 (×3): qty 30, 30d supply, fill #0
  Filled 2024-05-27: qty 30, 30d supply, fill #1
  Filled 2024-06-29: qty 30, 30d supply, fill #2
  Filled 2024-07-28: qty 30, 30d supply, fill #3
  Filled 2024-08-24: qty 30, 30d supply, fill #4

## 2024-04-14 MED ORDER — FUROSEMIDE 40 MG PO TABS
40.0000 mg | ORAL_TABLET | Freq: Every day | ORAL | 5 refills | Status: AC | PRN
  Filled 2024-04-14: qty 30, 30d supply, fill #0
  Filled 2024-06-07: qty 30, 30d supply, fill #1
  Filled 2024-08-24: qty 30, 30d supply, fill #2

## 2024-04-14 MED ORDER — ENTRESTO 24-26 MG PO TABS
1.0000 | ORAL_TABLET | Freq: Two times a day (BID) | ORAL | 3 refills | Status: DC
  Filled 2024-04-14: qty 60, 30d supply, fill #0

## 2024-04-14 MED ORDER — METOPROLOL SUCCINATE ER 100 MG PO TB24
100.0000 mg | ORAL_TABLET | Freq: Every day | ORAL | 5 refills | Status: DC
  Filled 2024-04-14: qty 90, 90d supply, fill #0

## 2024-04-14 NOTE — Patient Instructions (Addendum)
 It was a pleasure seeing you today!  MEDICATIONS: -START taking Entresto  24/26 mg - 1 tablet twice daily -DECREASE Lasix  (furosemide ) 40 mg - 1 tablet as needed for weight gain or shortness of breath (weight increased >3 lbs in 24 hours or >5 lbs in 1 week) -Continue taking metoprolol  succinate 100 mg - 1 tablet daily -Continue taking Farxiga  10 mg - 1 tablet daily - Continue taking spironolactone  25 mg - 1 tablet daily -Call if you have questions about your medications.  LABS: -We will call you if your labs need attention.  NEXT APPOINTMENT: Return to clinic in 3 weeks in the pharmacy clinic.  In general, to take care of your heart failure: -Limit your fluid intake to 2 Liters (half-gallon) per day.   -Limit your salt intake to ideally 2-3 grams (2000-3000 mg) per day. -Weigh yourself daily and record, and bring that weight diary to your next appointment.  (Weight gain of 2-3 pounds in 1 day typically means fluid weight.) -The medications for your heart are to help your heart and help you live longer.   -Please contact us  before stopping any of your heart medications.  Call the clinic at [phone] with questions or to reschedule future appointments.

## 2024-04-14 NOTE — Progress Notes (Signed)
 Advanced Heart Failure Clinic Note   Referring Physician: Theotis Haze ORN, NP  Primary Care: Theotis Haze ORN, NP Primary Cardiologist: Dr. Zenaida  HPI:  Leah Hernandez is a 63 y.o. female who presents for follow up of chronic systolic heart failure.   Admitted 11/2023 with near-syncope. Hgb was found to be 5.1 g/dL. Underwent EGD/colonoscopy showing transverse colon mass concerning for malignancy; s/p biopsy, and gastritis. Pathology showed invasive adenocarcinoma. General surgery was consulted to evaluate for hemicolectomy. Prior to surgery, she required cardiac evaluation. During hospitaliztion, she was noted to have several runs of VT. Subsequently, echocardiogram was ordered and showed EF 20-25%, global hypokinesis, grade I DD, severe asymmetric LVH, RV normal. Cardiology consulted. Underwent R/LHC showing no CAD, stable filling pressures and preserved CI 3.6. She underwent successful R hemicolectomy on 12/18/23.   Presented to AHF clinic for MD visit on 03/24/24. Weight was up mildly but no symptoms of worsening shortness of breath or heart failure. She denied any chest pain, leg swelling, orthopnea. She reported taking all her medications without issues. Denied dizziness or lightheadedness.   Today she returns to HF clinic for pharmacist medication titration. At last visit with MD, furosemide  was decreased to prn, losartan  was increased to 25 mg daily, and digoxin  was discontinued. She brought all of her medications to today's appointment. Notably, patient reported that she has not been cutting any of her tablets in half as prescribed. She was unfamiliar with the indications for each medication, and reported to be taking furosemide  40 mg tablet daily along with full tablets of her other medications. Noted to only be taking one tablet of metoprolol  succinate 100 mg rather than 1.5 tablets (150 mg). She has not been taking losartan  due to running out of refills. Noted her last prescription  was written for losartan  12.5 mg (half-tablet) daily, suspect she ran out early due to taking a full tablet. She denied SOB, reports walking one mile a day. No orthopnea, PND, or LEE noted. She reports checking her blood pressure daily, states normally between SBP 118-135 mmHg. Denies dizziness or lightheadedness. Checks her weight daily, reports weight stable between 198-199 lbs. Reports attempting to limit fluid intake, reports only drinking 2-3 bottles of water a day.   HF Medications: Metoprolol  succinate 150 mg daily - patient reported taking 100 mg daily Losartan  25 mg daily - not taking recently, ran out of refills Spironolactone  12.5 mg daily - patient reported taking 25 mg daily Farxiga  10 mg daily  Furosemide  40 mg PRN - patient reported taking daily  Has the patient been experiencing any side effects to the medications prescribed?  None reported  Does the patient have any problems obtaining medications due to transportation or finances?   No, patient has [place] Medicaid  Understanding of regimen: fair Understanding of indications: poor Potential of compliance: fair Patient understands to avoid NSAIDs. Patient understands to avoid decongestants.  Pertinent Lab Values: (02/12/24) Serum creatinine 1.33 mg/dL, BUN 15 mg/dL, Potassium 4.3 mmol/L, Sodium 138 mmol/L, BNP 65 pg/mL (01/09/24)  Vital Signs: Weight: 202.6 lbs (last clinic weight: 203 lbs) Blood pressure: 108/78 mmHg Heart rate: 77 bpm  Assessment/Plan: Chronic Systolic Heart Failure: Unclear etiology. Mild LGE in the mid inferior and inferolateral walls consistent with infarct, but on review of coronary angiogram there is no significant disease (though poorly visualized anomalous circ). Discussion today about ICD placement. Given minimal LGE, room to titrate GDMT, patient would prefer to continue titration prior to ICD consideration, which is reasonable. -  NICM primarily, euvolemic on exam.  - Decrease furosemide  to 40 mg  prn. Discussed extensively about when to take a dose, including weight fluctuation, signs of edema, or SOB.  - Continue metoprolol  succinate 100 mg daily. Updated medication list to reflect how she is currently taking it. - Start Entresto  24/26 mg twice daily. Patient has previously tolerated losartan  25 mg daily. Educated patient to continue monitoring BP at home and on the signs of hypotension. Will remove losartan  from medication list.  - Continue spironolactone  25 mg daily. Updated prescription sent to pharmacy.  - Continue Farxiga  10 mg daily.  - Patient has appointment with oncology tomorrow, will monitor lab results from this appointment.  - Follow-up in pharmacy clinic in 3 weeks for continued medication titration and education.    Anemia - Most recent Hgb 11.5 on 02/12/2024 - Oncology arranging iron  infusions   Colon Cancer  - Pathology showed invasive adenocarcinoma - s/p hemicolectomy 12/18/23. 28 lymph nodes negative - Surgery was considered curative, but she has high risk features that place her at moderate risk for recurrence.  Follow up in 3 weeks with pharmacy clinic  Morna Breach, PharmD PGY2 Cardiology Pharmacy Resident  Tinnie Redman, PharmD, BCPS, [facility], CPP Heart Failure Clinic Pharmacist [phone]

## 2024-04-14 NOTE — Progress Notes (Deleted)
 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 Sorter returns for follow up. Seen initially 12/26/23. Guardant reveal was not detected, she went on surveillance. Received IV iron    ROS   Past Medical History:  Diagnosis Date   Prediabetes      Past Surgical History:  Procedure Laterality Date   BIOPSY OF SKIN SUBCUTANEOUS TISSUE AND/OR MUCOUS MEMBRANE  12/14/2023   Procedure: BIOPSY, SKIN, SUBCUTANEOUS TISSUE, OR MUCOUS MEMBRANE;  Surgeon: Suzann Inocente HERO, MD;  Location: [facility] ENDOSCOPY;  Service: Gastroenterology;;   COLONOSCOPY N/A 12/14/2023   Procedure: COLONOSCOPY;  Surgeon: Suzann Inocente HERO, MD;  Location: [facility] ENDOSCOPY;  Service: Gastroenterology;  Laterality: N/A;   ESOPHAGOGASTRODUODENOSCOPY N/A 12/14/2023   Procedure: EGD (ESOPHAGOGASTRODUODENOSCOPY);  Surgeon: Suzann Inocente HERO, MD;  Location: [facility] ENDOSCOPY;  Service: Gastroenterology;  Laterality: N/A;   LAPAROSCOPIC PARTIAL COLECTOMY Right 12/18/2023   Procedure: LAPAROSCOPIC assisted  COLECTOMY;  Surgeon: Polly Cordella LABOR, MD;  Location: [facility] OR;  Service: General;  Laterality: Right;   POLYPECTOMY   12/14/2023   Procedure: POLYPECTOMY, INTESTINE;  Surgeon: Suzann Inocente HERO, MD;  Location: [facility] ENDOSCOPY;  Service: Gastroenterology;;   RIGHT/LEFT HEART CATH AND CORONARY ANGIOGRAPHY N/A 12/17/2023   Procedure: RIGHT/LEFT HEART CATH AND CORONARY ANGIOGRAPHY;  Surgeon: Mady Lonni, MD;  Location: [facility] INVASIVE CV LAB;  Service: Cardiovascular;  Laterality: N/A;   SCLEROTHERAPY  12/14/2023   Procedure: SCLEROTHERAPY;  Surgeon: Suzann Inocente HERO, MD;  Location: [facility] ENDOSCOPY;  Service: Gastroenterology;;   TUBAL LIGATION       Outpatient Encounter Medications as of 04/15/2024  Medication Sig   acetaminophen  (TYLENOL ) 325 MG tablet Take 325-650 mg by mouth every 6 (six) hours as needed for mild pain (pain score 1-3) or moderate pain (pain score 4-6).   dapagliflozin  propanediol (FARXIGA ) 10 MG TABS tablet Take 1 tablet (10 mg total) by mouth daily.   ENTRESTO  24-26 MG Take 1 tablet by mouth 2 (two) times daily.   furosemide  (LASIX ) 40 MG tablet Take 1 tablet (40 mg total) by mouth daily as needed for fluid or edema (Weight gain >3 lbs in a day or >5 lbs in one week).   methocarbamol  (ROBAXIN ) 500 MG tablet Take 500 mg by mouth 3 (three) times daily.   metoprolol  succinate (TOPROL -XL) 100 MG 24 hr tablet Take 1 tablet (100 mg total) by mouth daily.   polyethylene glycol powder (GLYCOLAX /MIRALAX ) 17 GM/SCOOP powder Take 17 g by mouth daily.   spironolactone  (ALDACTONE ) 25 MG tablet Take 1 tablet (25 mg total) by mouth daily.   No facility-administered encounter medications on file as of 04/15/2024.     There were no vitals filed  for this visit. There is no height or weight on file to calculate BMI.   ECOG PERFORMANCE STATUS: {CHL ONC ECOG PS:[id number]}  PHYSICAL EXAM GENERAL:alert, no distress and comfortable SKIN: no rash  EYES: sclera clear NECK: without mass LYMPH:  no palpable cervical or supraclavicular lymphadenopathy  LUNGS: clear with normal breathing effort HEART: regular rate &  rhythm, no lower extremity edema ABDOMEN: abdomen soft, non-tender and normal bowel sounds NEURO: alert & oriented x 3 with fluent speech, no focal motor/sensory deficits Breast exam:  PAC without erythema    CBC    Latest Ref Rng & Units 01/02/2024    9:38 AM 12/22/2023    3:04 AM 12/21/2023    3:13 AM  CBC  WBC 4.0 - 10.5 K/uL 3.9  6.5  8.1   Hemoglobin 12.0 - 15.0 g/dL 9.3  8.3  9.2   Hematocrit 36.0 - 46.0 % 30.1  27.4  29.6   Platelets 150 - 400 K/uL 339  391  390       CMP     Latest Ref Rng & Units 02/12/2024    8:46 AM 01/18/2024   11:20 AM 01/02/2024    9:38 AM  CMP  Glucose 70 - 99 mg/dL 896  878  897   BUN 8 - 23 mg/dL 12  19  11    Creatinine 0.44 - 1.00 mg/dL 8.78  8.47  8.92   Sodium 135 - 145 mmol/L 139  139  137   Potassium 3.5 - 5.1 mmol/L 4.6  4.3  4.5   Chloride 98 - 111 mmol/L 103  104  104   CO2 22 - 32 mmol/L 26  26  26    Calcium 8.9 - 10.3 mg/dL 89.9  9.5  9.5   Total Protein 6.5 - 8.1 g/dL   7.6   Total Bilirubin 0.0 - 1.2 mg/dL   0.4   Alkaline Phos 38 - 126 U/L   93   AST 15 - 41 U/L   16   ALT 0 - 44 U/L   8       ASSESSMENT & PLAN: 63 yo female    Adenocarcinoma of the hepatic flexure, G2, pT3 N0 M0, stage IIA, MMR normal -Presented with IDA, 30 lbs unintentional weight loss, constipation with rectal bleeding.  -EGD/Colonoscopy by Dr. Suzann 12/14/23 showing gastritis and small hernia as well as a malignant appearing partially obstructing 7 cm tumor in the transverse colon. Path confirmed adenocarcinoma.  -Baseline CEA elevated, 27.6 -Staging CT CAP showed the primary malignancy at the hepatic flexure as well as pericolonic and right upper quadrant mesenteric lymphadenopathy up to 10 mm suspicious for nodal involvement -S/p right hemicolectomy by Dr. Polly 12/18/23. Surgical path showed a 9.5 cm G2 invasive adenocarcinoma in the right colon invading through muscularis propria into pericolorectal soft tissue. No LVI but perineural invasion is  identified. 28 lymph nodes all negative, margins are clear.  -We recommend Guardant Reveal for circulating tumor DNA to further stratify her risk and determine benefit of adjuvant chemo (FOLFOX due to right colon cancer). ctDNA was NOT detected and she went on surveillance    Anemia, 2/2 iron  and B12 deficiency  -Secondary to #1  -Normal CBC in 2021, hgb 5.7, ferritin 3, iron  10, 3% sat, and normal TIBC 379 s/p total of 4 units RBC transfusions and 3 doses IV venofer  (200 mg each) -Low-normal B12 of 250; she began B12 injections.  -We recommend 2 additional doses IV Venofer  200 mg  to complete 1 g replacement, pt agrees -She prefers to get B12 from prenatal vitamin for now, and resume injections if B12 drops again in the future. We discussed she may not absorb well in the future s/p hemicolectomy  -Recommend taking prenatal vitamin         PLAN:  No orders of the defined types were placed in this encounter.     All questions were answered. The patient knows to call the clinic with any problems, questions or concerns. No barriers to learning were detected. I spent *** counseling the patient face to face. The total time spent in the appointment was *** and more than 50% was on counseling, review of test results, and coordination of care.   Jalia Zuniga K Angeline Trick, NP 04/14/2024 8:17 PM

## 2024-04-15 ENCOUNTER — Inpatient Hospital Stay: Admitting: Nurse Practitioner

## 2024-04-15 ENCOUNTER — Inpatient Hospital Stay

## 2024-04-18 ENCOUNTER — Other Ambulatory Visit

## 2024-04-18 ENCOUNTER — Inpatient Hospital Stay

## 2024-04-18 ENCOUNTER — Encounter: Payer: Self-pay | Admitting: Nurse Practitioner

## 2024-04-18 ENCOUNTER — Inpatient Hospital Stay: Attending: Nurse Practitioner | Admitting: Nurse Practitioner

## 2024-04-18 VITALS — BP 106/66 | HR 79 | Temp 97.7°F | Resp 17 | Ht 66.0 in | Wt 202.4 lb

## 2024-04-18 DIAGNOSIS — D509 Iron deficiency anemia, unspecified: Secondary | ICD-10-CM | POA: Insufficient documentation

## 2024-04-18 DIAGNOSIS — C183 Malignant neoplasm of hepatic flexure: Secondary | ICD-10-CM

## 2024-04-18 DIAGNOSIS — K625 Hemorrhage of anus and rectum: Secondary | ICD-10-CM | POA: Insufficient documentation

## 2024-04-18 DIAGNOSIS — E538 Deficiency of other specified B group vitamins: Secondary | ICD-10-CM | POA: Insufficient documentation

## 2024-04-18 DIAGNOSIS — K5900 Constipation, unspecified: Secondary | ICD-10-CM | POA: Insufficient documentation

## 2024-04-18 LAB — CBC WITH DIFFERENTIAL (CANCER CENTER ONLY)
Abs Immature Granulocytes: 0.01 K/uL (ref 0.00–0.07)
Basophils Absolute: 0 K/uL (ref 0.0–0.1)
Basophils Relative: 1 %
Eosinophils Absolute: 0.1 K/uL (ref 0.0–0.5)
Eosinophils Relative: 3 %
HCT: 40.9 % (ref 36.0–46.0)
Hemoglobin: 13.7 g/dL (ref 12.0–15.0)
Immature Granulocytes: 0 %
Lymphocytes Relative: 57 %
Lymphs Abs: 2 K/uL (ref 0.7–4.0)
MCH: 31.3 pg (ref 26.0–34.0)
MCHC: 33.5 g/dL (ref 30.0–36.0)
MCV: 93.4 fL (ref 80.0–100.0)
Monocytes Absolute: 0.3 K/uL (ref 0.1–1.0)
Monocytes Relative: 9 %
Neutro Abs: 1 K/uL — ABNORMAL LOW (ref 1.7–7.7)
Neutrophils Relative %: 30 %
Platelet Count: 208 K/uL (ref 150–400)
RBC: 4.38 MIL/uL (ref 3.87–5.11)
RDW: 13.3 % (ref 11.5–15.5)
WBC Count: 3.4 K/uL — ABNORMAL LOW (ref 4.0–10.5)
nRBC: 0 % (ref 0.0–0.2)

## 2024-04-18 LAB — CMP (CANCER CENTER ONLY)
ALT: 10 U/L (ref 0–44)
AST: 17 U/L (ref 15–41)
Albumin: 4.3 g/dL (ref 3.5–5.0)
Alkaline Phosphatase: 64 U/L (ref 38–126)
Anion gap: 6 (ref 5–15)
BUN: 29 mg/dL — ABNORMAL HIGH (ref 8–23)
CO2: 26 mmol/L (ref 22–32)
Calcium: 9.9 mg/dL (ref 8.9–10.3)
Chloride: 106 mmol/L (ref 98–111)
Creatinine: 1.38 mg/dL — ABNORMAL HIGH (ref 0.44–1.00)
GFR, Estimated: 43 mL/min — ABNORMAL LOW (ref >60)
Glucose, Bld: 91 mg/dL (ref 70–99)
Potassium: 4.7 mmol/L (ref 3.5–5.1)
Sodium: 138 mmol/L (ref 135–145)
Total Bilirubin: 0.5 mg/dL (ref 0.0–1.2)
Total Protein: 7.8 g/dL (ref 6.5–8.1)

## 2024-04-18 LAB — CEA (ACCESS): CEA (CHCC): 1.33 ng/mL (ref 0.00–5.00)

## 2024-04-18 LAB — IRON AND IRON BINDING CAPACITY (CC-WL,HP ONLY)
Iron: 97 ug/dL (ref 28–170)
Saturation Ratios: 28 % (ref 10.4–31.8)
TIBC: 349 ug/dL (ref 250–450)
UIBC: 252 ug/dL (ref 148–442)

## 2024-04-18 LAB — VITAMIN B12: Vitamin B-12: 297 pg/mL (ref 180–914)

## 2024-04-18 LAB — FERRITIN: Ferritin: 152 ng/mL (ref 11–307)

## 2024-04-18 NOTE — Progress Notes (Signed)
 [facility]   Telephone:(336) [phone] Fax:(336) [phone]    Patient Care Team: Theotis Haze ORN, NP as PCP - General (Nurse Practitioner) Lonni Slain, MD as PCP - Cardiology (Cardiology) Ardis Evalene CROME, RN as Oncology Nurse Navigator   CHIEF COMPLAINT: Follow up IDA and B12 deficiency anemia, and colon cancer   Oncology History  Primary cancer of hepatic flexure of colon (HCC)  12/14/2023 Initial Diagnosis   Primary cancer of hepatic flexure of colon (HCC)   12/26/2023 Cancer Staging   Staging form: Colon and Rectum, AJCC 8th Edition - Pathologic: Stage IIA (pT3, pN0, cM0) - Signed by Dell Briner K, NP on 12/26/2023 Stage prefix: Initial diagnosis Total positive nodes: 0 Histologic grading system: 4 grade system Histologic grade (G): G2      CURRENT THERAPY: Iron  and B12 supplement (not currently taking); cancer surveillance   INTERVAL HISTORY Leah Hernandez returns for follow up. Seen initially 12/26/23. Guardant reveal was not detected, she went on surveillance. Received IV iron , not currently taking oral iron  or B12. She is coping, walking more and hopes to get back to work part time. Eating/drinking well. Has some occasional lower abdominal discomfort and constipation that she manages with miralax  PRN. Denies overt pain, n/v, rectal bleeding, or other concerns.   ROS  All other systems reviewed and negative   Past Medical History:  Diagnosis Date   Prediabetes      Past Surgical History:  Procedure Laterality Date   BIOPSY OF SKIN SUBCUTANEOUS TISSUE AND/OR MUCOUS MEMBRANE  12/14/2023   Procedure: BIOPSY, SKIN, SUBCUTANEOUS TISSUE, OR MUCOUS MEMBRANE;  Surgeon: Suzann Inocente HERO, MD;  Location: [facility] ENDOSCOPY;  Service: Gastroenterology;;   COLONOSCOPY N/A 12/14/2023   Procedure: COLONOSCOPY;  Surgeon: Suzann Inocente HERO, MD;  Location: [facility] ENDOSCOPY;  Service: Gastroenterology;  Laterality: N/A;   ESOPHAGOGASTRODUODENOSCOPY N/A 12/14/2023    Procedure: EGD (ESOPHAGOGASTRODUODENOSCOPY);  Surgeon: Suzann Inocente HERO, MD;  Location: [facility] ENDOSCOPY;  Service: Gastroenterology;  Laterality: N/A;   LAPAROSCOPIC PARTIAL COLECTOMY Right 12/18/2023   Procedure: LAPAROSCOPIC assisted  COLECTOMY;  Surgeon: Polly Cordella LABOR, MD;  Location: [facility] OR;  Service: General;  Laterality: Right;   POLYPECTOMY  12/14/2023   Procedure: POLYPECTOMY, INTESTINE;  Surgeon: Suzann Inocente HERO, MD;  Location: [facility] ENDOSCOPY;  Service: Gastroenterology;;   RIGHT/LEFT HEART CATH AND CORONARY ANGIOGRAPHY N/A 12/17/2023   Procedure: RIGHT/LEFT HEART CATH AND CORONARY ANGIOGRAPHY;  Surgeon: Mady Lonni, MD;  Location: [facility] INVASIVE CV LAB;  Service: Cardiovascular;  Laterality: N/A;   SCLEROTHERAPY  12/14/2023   Procedure: SCLEROTHERAPY;  Surgeon: Suzann Inocente HERO, MD;  Location: [facility] ENDOSCOPY;  Service: Gastroenterology;;   TUBAL LIGATION       Outpatient Encounter Medications as of 04/18/2024  Medication Sig   acetaminophen  (TYLENOL ) 325 MG tablet Take 325-650 mg by mouth every 6 (six) hours as needed for mild pain (pain score 1-3) or moderate pain (pain score 4-6).   dapagliflozin  propanediol (FARXIGA ) 10 MG TABS tablet Take 1 tablet (10 mg total) by mouth daily.   ENTRESTO  24-26 MG Take 1 tablet by mouth 2 (two) times daily.   furosemide  (LASIX ) 40 MG tablet Take 1 tablet (40 mg total) by mouth daily as needed for fluid or edema (Weight gain >3 lbs in a day or >5 lbs in one week).   methocarbamol  (ROBAXIN ) 500 MG tablet Take 500 mg by mouth 3 (three) times daily.   metoprolol  succinate (TOPROL -XL) 100 MG 24 hr tablet Take 1 tablet (100  mg total) by mouth daily.   polyethylene glycol powder (GLYCOLAX /MIRALAX ) 17 GM/SCOOP powder Take 17 g by mouth daily.   spironolactone  (ALDACTONE ) 25 MG tablet Take 1 tablet (25 mg total) by mouth daily.   No facility-administered encounter medications on file as of 04/18/2024.     Today's Vitals   04/18/24 1025  BP: 106/66  Pulse:  79  Resp: 17  Temp: 97.7 F (36.5 C)  TempSrc: Temporal  SpO2: 95%  Weight: 202 lb 6.4 oz (91.8 kg)  Height: 5' 6 (1.676 m)   Body mass index is 32.67 kg/m.   ECOG PERFORMANCE STATUS: 1 - Symptomatic but completely ambulatory  PHYSICAL EXAM GENERAL:alert, no distress and comfortable SKIN: no rash  EYES: sclera clear NECK: without mass LYMPH:  no palpable cervical or supraclavicular lymphadenopathy  LUNGS: clear with normal breathing effort HEART: regular rate & rhythm, no lower extremity edema ABDOMEN: abdomen soft, non-tender and normal bowel sounds. Midline incision well healed with mild scar tissue. No mass/nodularity  NEURO: alert & oriented x 3 with fluent speech, no focal motor/sensory deficits    CBC    Latest Ref Rng & Units 04/18/2024   10:07 AM 01/02/2024    9:38 AM 12/22/2023    3:04 AM  CBC  WBC 4.0 - 10.5 K/uL 3.4  3.9  6.5   Hemoglobin 12.0 - 15.0 g/dL 86.2  9.3  8.3   Hematocrit 36.0 - 46.0 % 40.9  30.1  27.4   Platelets 150 - 400 K/uL 208  339  391       CMP     Latest Ref Rng & Units 04/18/2024   10:07 AM 02/12/2024    8:46 AM 01/18/2024   11:20 AM  CMP  Glucose 70 - 99 mg/dL 91  896  878   BUN 8 - 23 mg/dL 29  12  19    Creatinine 0.44 - 1.00 mg/dL 8.61  8.78  8.47   Sodium 135 - 145 mmol/L 138  139  139   Potassium 3.5 - 5.1 mmol/L 4.7  4.6  4.3   Chloride 98 - 111 mmol/L 106  103  104   CO2 22 - 32 mmol/L 26  26  26    Calcium 8.9 - 10.3 mg/dL 9.9  89.9  9.5   Total Protein 6.5 - 8.1 g/dL 7.8     Total Bilirubin 0.0 - 1.2 mg/dL 0.5     Alkaline Phos 38 - 126 U/L 64     AST 15 - 41 U/L 17     ALT 0 - 44 U/L 10         ASSESSMENT & PLAN: 63 yo female    Adenocarcinoma of the hepatic flexure, G2, pT3 N0 M0, stage IIA, MMR normal -Presented with IDA, 30 lbs unintentional weight loss, constipation with rectal bleeding.  -EGD/Colonoscopy by Dr. Suzann 12/14/23 showing gastritis and small hernia as well as a malignant appearing partially  obstructing 7 cm tumor in the transverse colon. Path confirmed adenocarcinoma.  -Baseline CEA elevated, 27.6 -Staging CT CAP showed the primary malignancy at the hepatic flexure as well as pericolonic and right upper quadrant mesenteric lymphadenopathy up to 10 mm suspicious for nodal involvement -S/p right hemicolectomy by Dr. Polly 12/18/23. Surgical path showed a 9.5 cm G2 invasive adenocarcinoma in the right colon invading through muscularis propria into pericolorectal soft tissue. No LVI but perineural invasion is identified. 28 lymph nodes all negative, margins are clear.  -We recommend Guardant Reveal for circulating  tumor DNA to further stratify her risk and determine benefit of adjuvant chemo (FOLFOX due to right colon cancer). ctDNA was NOT detected and she went on surveillance  - Ms. Arauz is clinically doing well, exam is benign.  CBC normalized except ANC 1.0, will follow-up the pending B12 level from today.  Overall no clinical concern for recurrence  - Continue colon cancer surveillance, CT DNA next week and q3 months, then scan in 3 months Follow-up a week after surveillance scan in 3 months  anemia, 2/2 iron  and B12 deficiency  -Secondary to #1  -Normal CBC in 2021, hgb 5.7, ferritin 3, iron  10, 3% sat, and normal TIBC 379 s/p total of 4 units RBC transfusions and 3 doses IV venofer  (200 mg each) -Low-normal B12 of 250; she began B12 injections.  -We recommend 2 additional doses IV Venofer  200 mg to complete 1 g replacement, pt agrees -She prefers to get B12 from prenatal vitamin for now, and resume injections if B12 drops again in the future. We discussed she may not absorb well in the future s/p hemicolectomy  -I recommended taking prenatal vitamin but she has not started yet, agrees to start now        PLAN: -Labs reviewed, we will follow-up pending iron , B12, and CEA from today -Recommend starting prenatal vitamin  -Continue colon cancer surveillance -ctDNA next week,  then q3 months -Surveillance scan and follow-up in 3 months  Orders Placed This Encounter  Procedures   CT ABDOMEN PELVIS W CONTRAST    Standing Status:   Future    Expected Date:   07/18/2024    Expiration Date:   04/18/2025    If indicated for the ordered procedure, I authorize the administration of contrast media per Radiology protocol:   Yes    Does the patient have a contrast media/X-ray dye allergy?:   No    Preferred imaging location?:   [facility]    If indicated for the ordered procedure, I authorize the administration of oral contrast media per Radiology protocol:   Yes   Guardant Reveal    Standing Status:   Standing    Number of Occurrences:   4    Expiration Date:   04/18/2025    Scheduling Instructions:     Stage II colon cancer s/p surgery 12/18/23     Surveillance    CC Results:   Leah Hernandez [[id number]]   CBC with Differential (Cancer Center Only)    Standing Status:   Future    Expiration Date:   04/18/2025   CMP (Cancer Center only)    Standing Status:   Future    Expiration Date:   04/18/2025   Ferritin    Standing Status:   Future    Expiration Date:   04/18/2025   Iron  and Iron  Binding Capacity ([facility] only)    Standing Status:   Future    Expiration Date:   04/18/2025   CEA (Access)-[facility] ONLY    Standing Status:   Future    Expiration Date:   04/18/2025      All questions were answered. The patient knows to call the clinic with any problems, questions or concerns. No barriers to learning were detected.  Laurance Heide K Briya Lookabaugh, NP 04/18/2024

## 2024-04-21 ENCOUNTER — Telehealth (HOSPITAL_COMMUNITY): Payer: Self-pay

## 2024-04-21 ENCOUNTER — Ambulatory Visit: Payer: Self-pay | Admitting: Nurse Practitioner

## 2024-04-21 ENCOUNTER — Other Ambulatory Visit: Payer: Self-pay

## 2024-04-21 NOTE — Progress Notes (Signed)
 PATIENT NAVIGATOR PROGRESS NOTE  Name: Leah Hernandez Date: 04/21/2024 MRN: [id number]  DOB: Oct 27, 1960   Patient is established with a treatment plan (surveillance) and is actively engaged in care. Nurse Navigator services not currently indicated at this time. Will re-evaluate if needs change or if additional support is requested.

## 2024-04-21 NOTE — Telephone Encounter (Signed)
 Medicaid form received from Dr. Starla office.

## 2024-04-23 ENCOUNTER — Telehealth (HOSPITAL_COMMUNITY): Payer: Self-pay

## 2024-04-23 NOTE — Telephone Encounter (Signed)
 Attempted to call patient regarding cardiac rehab- no answer, left message. Sent MyChart message.

## 2024-04-23 NOTE — Telephone Encounter (Signed)
 Patient called back to get scheduled in the Cardiac Rehab Program. Patient will come in for orientation on 10/16 and will attend the 10:15 exercise class.  Sent MyChart message.

## 2024-04-24 ENCOUNTER — Telehealth (HOSPITAL_COMMUNITY): Payer: Self-pay

## 2024-04-24 NOTE — Telephone Encounter (Signed)
 Received a fax requesting medical records from [employer]. Records were successfully faxed to: [phone] ,which was the number provided.. Medical request form will be scanned into patients chart.   Phone Number: [phone]

## 2024-05-01 ENCOUNTER — Telehealth (HOSPITAL_COMMUNITY): Payer: Self-pay

## 2024-05-01 NOTE — Telephone Encounter (Signed)
 Received a fax requesting medical records from [employer]. Records were successfully faxed to: [phone] ,which was the number provided.. Medical request form will be scanned into patients chart.  Phone number: [phone]

## 2024-05-02 ENCOUNTER — Other Ambulatory Visit: Payer: Self-pay

## 2024-05-04 NOTE — Progress Notes (Incomplete)
 ***In Progress***    Advanced Heart Failure Clinic Note   Referring Physician: Theotis Haze ORN, NP  Primary Care: Theotis Haze ORN, NP Primary Cardiologist: Dr. Zenaida  HPI:  Leah Hernandez is a 63 y.o. female who presents for follow up of chronic systolic heart failure.    Admitted 11/2023 with near-syncope. Hgb was found to be 5.1 g/dL. Underwent EGD/colonoscopy showing transverse colon mass concerning for malignancy; s/p biopsy, and gastritis. Pathology showed invasive adenocarcinoma. General surgery was consulted to evaluate for hemicolectomy. Prior to surgery, she required cardiac evaluation. During hospitaliztion, she was noted to have several runs of VT. Subsequently, echocardiogram was ordered and showed EF 20-25%, global hypokinesis, grade I DD, severe asymmetric LVH, RV normal. Cardiology consulted. Underwent R/LHC showing no CAD, stable filling pressures and preserved CI 3.6. She underwent successful R hemicolectomy on 12/18/23.    Presented to AHF clinic for MD visit on 03/24/24. Weight was up mildly but no symptoms of worsening shortness of breath or heart failure. She denied any chest pain, leg swelling, orthopnea. She reported taking all her medications without issues. Denied dizziness or lightheadedness.    She returned to HF clinic for pharmacist medication titration on 04/14/24. Patient reported that she has not been cutting any of her tablets in half as prescribed. She was unfamiliar with the indications for each medication, and reported to be taking furosemide  40 mg tablet daily along with full tablets of her other medications. She denied SOB, reported walking one mile a day. No orthopnea, PND, or LEE noted. She reported checking her blood pressure daily, states normally ranged between SBP 118-135 mmHg. Reported weight was stable between 198-199 lbs at home.  Today she returns to HF clinic for pharmacist medication titration. At last visit, patient was started on Entresto  24/26  mg BID and furosemide  was decreased to 40 mg as needed. ***  Shortness of breath/dyspnea on exertion? {YES NO:22349}  Orthopnea/PND? {YES NO:22349} Edema? {YES NO:22349} Lightheadedness/dizziness? {YES NO:22349} Daily weights at home? {YES NO:22349} Blood pressure/heart rate monitoring at home? {YES [id number] Following low-sodium/fluid-restricted diet? {YES NO:22349}  HF Medications: Metoprolol  succinate 100 mg daily Entresto  24/26 mg BID Spironolactone  25 mg daily Farxiga  10 mg daily Furosemide  40 mg PRN  Has the patient been experiencing any side effects to the medications prescribed?  {YES NO:22349}  Does the patient have any problems obtaining medications due to transportation or finances?   {YES NO:22349}  Understanding of regimen: {excellent/good/fair/poor:19665} Understanding of indications: {excellent/good/fair/poor:19665} Potential of compliance: {excellent/good/fair/poor:19665} Patient understands to avoid NSAIDs. Patient understands to avoid decongestants.    Pertinent Lab Values: (04/18/24) Serum creatinine 1.38 mg/dL, BUN 29 mg/dL, Potassium 4.7 mmol/L, Sodium 138 mmol/L, BNP 65 pg/mL (01/09/24)  Vital Signs: Weight: *** (last clinic weight: 202 lbs) Blood pressure: *** (last clinic BP: 108/78 mmHg) *** Heart rate: *** (last clinic HR: 77 bpm) ***  Assessment/Plan: Chronic Systolic Heart Failure: Unclear etiology. Mild LGE in the mid inferior and inferolateral walls consistent with infarct, but on review of coronary angiogram there is no significant disease (though poorly visualized anomalous circ). Discussion today about ICD placement. Given minimal LGE, room to titrate GDMT, patient would prefer to continue titration prior to ICD consideration, which is reasonable. - NICM primarily, euvolemic on exam. *** - Decrease furosemide  to 40 mg prn. Discussed extensively about when to take a dose, including weight fluctuation, signs of edema, or SOB. *** - Continue  metoprolol  succinate 100 mg daily. Updated medication list to reflect how she is currently  taking it. *** - Start Entresto  24/26 mg twice daily. Patient has previously tolerated losartan  25 mg daily. Educated patient to continue monitoring BP at home and on the signs of hypotension. Will remove losartan  from medication list. *** - Continue spironolactone  25 mg daily. Updated prescription sent to pharmacy. *** - Continue Farxiga  10 mg daily. *** - Patient has appointment with oncology tomorrow, will monitor lab results from this appointment. *** - Follow-up in pharmacy clinic in 3 weeks for continued medication titration and education. ***   Anemia - Most recent Hgb 13.7 on 04/18/2024 - Oncology arranging iron  infusions   Colon Cancer  - Pathology showed invasive adenocarcinoma - s/p hemicolectomy 12/18/23. 28 lymph nodes negative - Surgery was considered curative, but she has high risk features that place her at moderate risk for recurrence.  Follow up ***  Morna Breach, PharmD PGY2 Cardiology Pharmacy Resident

## 2024-05-05 ENCOUNTER — Other Ambulatory Visit: Payer: Self-pay

## 2024-05-05 ENCOUNTER — Other Ambulatory Visit (HOSPITAL_COMMUNITY): Payer: Self-pay

## 2024-05-05 ENCOUNTER — Inpatient Hospital Stay (HOSPITAL_COMMUNITY)
Admission: RE | Admit: 2024-05-05 | Discharge: 2024-05-05 | Disposition: A | Source: Ambulatory Visit | Attending: Cardiology

## 2024-05-05 VITALS — BP 120/78 | HR 70 | Wt 205.0 lb

## 2024-05-05 DIAGNOSIS — Z85038 Personal history of other malignant neoplasm of large intestine: Secondary | ICD-10-CM | POA: Diagnosis not present

## 2024-05-05 DIAGNOSIS — Z7984 Long term (current) use of oral hypoglycemic drugs: Secondary | ICD-10-CM | POA: Diagnosis not present

## 2024-05-05 DIAGNOSIS — D649 Anemia, unspecified: Secondary | ICD-10-CM | POA: Insufficient documentation

## 2024-05-05 DIAGNOSIS — I5022 Chronic systolic (congestive) heart failure: Secondary | ICD-10-CM | POA: Insufficient documentation

## 2024-05-05 DIAGNOSIS — I5020 Unspecified systolic (congestive) heart failure: Secondary | ICD-10-CM

## 2024-05-05 DIAGNOSIS — Z79899 Other long term (current) drug therapy: Secondary | ICD-10-CM | POA: Diagnosis not present

## 2024-05-05 MED ORDER — SACUBITRIL-VALSARTAN 49-51 MG PO TABS
1.0000 | ORAL_TABLET | Freq: Two times a day (BID) | ORAL | 11 refills | Status: DC
  Filled 2024-05-05: qty 60, 30d supply, fill #0

## 2024-05-05 NOTE — Patient Instructions (Signed)
 It was a pleasure seeing you today!  MEDICATIONS: -We are changing your medications today -Increase Entresto  to 49/51 mg (1 tablet) two times daily -Continue taking furosemide  40 mg (1 tablet) as needed for weight gain >3 lbs in 1 day or >5 lbs in one week -Call if you have questions about your medications.  LABS: -We will call you if your labs need attention.  NEXT APPOINTMENT: Return to clinic on November 3rd at 1pm with [facility] (pharmacy).  In general, to take care of your heart failure: -Limit your fluid intake to 2 Liters (half-gallon) per day.   -Limit your salt intake to ideally 2-3 grams (2000-3000 mg) per day. -Weigh yourself daily and record, and bring that weight diary to your next appointment.  (Weight gain of 2-3 pounds in 1 day typically means fluid weight.) -The medications for your heart are to help your heart and help you live longer.   -Please contact us  before stopping any of your heart medications.  Call the clinic at [phone] with questions or to reschedule future appointments.

## 2024-05-05 NOTE — Progress Notes (Signed)
 Advanced Heart Failure Clinic Note   Referring Physician: Theotis Haze ORN, NP  Primary Care: Theotis Haze ORN, NP Primary Cardiologist: Dr. Zenaida  HPI:  Leah Hernandez is a 63 y.o. female who presents for follow up of chronic systolic heart failure.    Admitted 11/2023 with near-syncope. Hgb was found to be 5.1 g/dL. Underwent EGD/colonoscopy showing transverse colon mass concerning for malignancy; s/p biopsy, and gastritis. Pathology showed invasive adenocarcinoma. General surgery was consulted to evaluate for hemicolectomy. Prior to surgery, she required cardiac evaluation. During hospitaliztion, she was noted to have several runs of VT. Subsequently, echocardiogram was ordered and showed EF 20-25%, global hypokinesis, grade I DD, severe asymmetric LVH, RV normal. Cardiology consulted. Underwent R/LHC showing no CAD, stable filling pressures and preserved CI 3.6. She underwent successful R hemicolectomy on 12/18/23.    Presented to AHF clinic for MD visit on 03/24/24. Weight was up mildly but no symptoms of worsening shortness of breath or heart failure. She denied any chest pain, leg swelling, orthopnea. She reported taking all her medications without issues. Denied dizziness or lightheadedness.    She returned to HF clinic for pharmacist medication titration on 04/14/24. Patient reported that she has not been cutting any of her tablets in half as prescribed. She was unfamiliar with the indications for each medication, and reported to be taking furosemide  40 mg tablet daily along with full tablets of her other medications.   Today she returns to HF clinic with her daughter for pharmacist medication titration. At last visit, patient was started on Entresto  24/26 mg BID and furosemide  was decreased to 40 mg as needed. She is overall doing well. Adherent with all medications, compliant with taking furosemide  as needed for weight gain. Reports weight has fluctuated between 203-208 lbs at home.  Weight up 3 lbs in clinic since last visit. Reports taking furosemide  ~3 times per week. Denies SOB, PND, or orthopnea. Continues to walk a mile around the neighborhood every day. Denies chest pain or palpitations. Denies dizziness, lightheadedness, or fatigue. Reports occasionally check BP at home, most recently 110/88 mmHg. Patient has been receiving assistance from family to help monitor her weight and blood pressure.   HF Medications: Metoprolol  succinate 100 mg daily Entresto  24/26 mg BID Spironolactone  25 mg daily Farxiga  10 mg daily Furosemide  40 mg PRN  Has the patient been experiencing any side effects to the medications prescribed?  None reported  Does the patient have any problems obtaining medications due to transportation or finances?   No, patient has [place] Medicaid  Understanding of regimen: good Understanding of indications: good Potential of compliance: good Patient understands to avoid NSAIDs. Patient understands to avoid decongestants.    Pertinent Lab Values: (04/18/24) Serum creatinine 1.38 mg/dL, BUN 29 mg/dL, Potassium 4.7 mmol/L, Sodium 138 mmol/L, BNP 65 pg/mL (01/09/24)  Vital Signs: Weight: 205 lbs (last clinic weight: 202 lbs) Blood pressure: 120/78 mmHg (last clinic BP: 108/78 mmHg) Heart rate: 70 bpm (last clinic HR: 77 bpm)  Assessment/Plan: Chronic Systolic Heart Failure: Unclear etiology. Mild LGE in the mid inferior and inferolateral walls consistent with infarct, but on review of coronary angiogram there is no significant disease (though poorly visualized anomalous circ). Discussion today about ICD placement. Given minimal LGE, room to titrate GDMT, patient would prefer to continue titration prior to ICD consideration, which is reasonable. - NICM primarily, euvolemic on exam.  - Continue furosemide  to 40 mg prn. Patient appropriately taking doses as needed for weight fluctuation, signs of  edema, or SOB.  - Continue metoprolol  succinate 100 mg daily. -  Increase to Entresto  49/51 mg twice daily. Educated patient to continue monitoring BP at home and on the signs of hypotension. Should also help provide some gentle diuresis. - Continue spironolactone  25 mg daily.  - Continue Farxiga  10 mg daily.  - Follow-up in pharmacy clinic in 4 weeks for continued medication titration and education. Will recheck BMET at this upcoming appointment.   Anemia - Most recent Hgb 13.7 on 04/18/2024 - Oncology arranging iron  infusions   Colon Cancer  - Pathology showed invasive adenocarcinoma - s/p hemicolectomy 12/18/23. 28 lymph nodes negative - Surgery was considered curative, but she has high risk features that place her at moderate risk for recurrence.  Follow up with pharmacy clinic in 1 month.   Please do not hesitate to reach out with questions or concerns,  Leah Hernandez, PharmD PGY2 Cardiology Pharmacy Resident  Leah Hernandez, PharmD, CPP, BCPS, [facility] [place] [facility] Failure Pharmacist  Phone - [phone] 05/05/2024 4:18 PM

## 2024-05-14 ENCOUNTER — Other Ambulatory Visit (HOSPITAL_COMMUNITY): Payer: Self-pay

## 2024-05-14 ENCOUNTER — Other Ambulatory Visit: Payer: Self-pay

## 2024-05-14 DIAGNOSIS — D649 Anemia, unspecified: Secondary | ICD-10-CM | POA: Diagnosis not present

## 2024-05-14 DIAGNOSIS — N1831 Chronic kidney disease, stage 3a: Secondary | ICD-10-CM | POA: Diagnosis not present

## 2024-05-14 DIAGNOSIS — N898 Other specified noninflammatory disorders of vagina: Secondary | ICD-10-CM | POA: Diagnosis not present

## 2024-05-14 DIAGNOSIS — R7303 Prediabetes: Secondary | ICD-10-CM | POA: Diagnosis not present

## 2024-05-14 DIAGNOSIS — I428 Other cardiomyopathies: Secondary | ICD-10-CM | POA: Diagnosis not present

## 2024-05-14 DIAGNOSIS — Z0000 Encounter for general adult medical examination without abnormal findings: Secondary | ICD-10-CM | POA: Diagnosis not present

## 2024-05-14 DIAGNOSIS — E66812 Obesity, class 2: Secondary | ICD-10-CM | POA: Diagnosis not present

## 2024-05-14 DIAGNOSIS — Z23 Encounter for immunization: Secondary | ICD-10-CM | POA: Diagnosis not present

## 2024-05-14 MED ORDER — FARXIGA 10 MG PO TABS
10.0000 mg | ORAL_TABLET | Freq: Every day | ORAL | 1 refills | Status: AC
  Filled 2024-05-14: qty 90, 90d supply, fill #0
  Filled 2024-07-28: qty 90, 90d supply, fill #1

## 2024-05-14 NOTE — Telephone Encounter (Signed)
 FYI - looks like this needs a PA

## 2024-05-15 ENCOUNTER — Encounter (HOSPITAL_COMMUNITY)
Admission: RE | Admit: 2024-05-15 | Discharge: 2024-05-15 | Disposition: A | Discharge status: Home / Self Care | Source: Ambulatory Visit | Attending: Cardiology | Admitting: Cardiology

## 2024-05-15 VITALS — BP 104/62 | HR 73 | Ht 66.0 in | Wt 210.1 lb

## 2024-05-15 DIAGNOSIS — I5022 Chronic systolic (congestive) heart failure: Secondary | ICD-10-CM | POA: Insufficient documentation

## 2024-05-15 NOTE — Progress Notes (Signed)
 Cardiac Individual Treatment Plan  Patient Details  Name: Leah Hernandez MRN: [id number] Date of Birth: Mar 10, 1961 Referring Provider:   Flowsheet Row CARDIAC REHAB PHASE II ORIENTATION from 05/15/2024 in [facility]  Referring Provider [name], MD    Initial Encounter Date:  Flowsheet Row CARDIAC REHAB PHASE II ORIENTATION from 05/15/2024 in [facility], [facility]  Date 05/15/24    Visit Diagnosis: Heart failure, chronic systolic (HCC)  Patient's Home Medications on Admission:  Current Outpatient Medications:    acetaminophen  (TYLENOL ) 325 MG tablet, Take 325-650 mg by mouth every 6 (six) hours as needed for mild pain (pain score 1-3) or moderate pain (pain score 4-6)., Disp: , Rfl:    FARXIGA  10 MG TABS tablet, Take 1 tablet (10 mg total) by mouth daily., Disp: 90 tablet, Rfl: 1   furosemide  (LASIX ) 40 MG tablet, Take 1 tablet (40 mg total) by mouth daily as needed for fluid or edema (Weight gain >3 lbs in a day or >5 lbs in one week)., Disp: 30 tablet, Rfl: 5   methocarbamol  (ROBAXIN ) 500 MG tablet, Take 500 mg by mouth 3 (three) times daily., Disp: , Rfl:    metoprolol  succinate (TOPROL -XL) 100 MG 24 hr tablet, Take 1 tablet (100 mg total) by mouth daily., Disp: 90 tablet, Rfl: 5   polyethylene glycol powder (GLYCOLAX /MIRALAX ) 17 GM/SCOOP powder, Take 17 g by mouth daily., Disp: 238 g, Rfl: 0   sacubitril -valsartan  (ENTRESTO ) 49-51 MG, Take 1 tablet by mouth 2 (two) times daily., Disp: 60 tablet, Rfl: 11   spironolactone  (ALDACTONE ) 25 MG tablet, Take 1 tablet (25 mg total) by mouth daily., Disp: 30 tablet, Rfl: 5  Past Medical History: Past Medical History:  Diagnosis Date   Prediabetes     Tobacco Use: Social History   Tobacco Use  Smoking Status Never  Smokeless Tobacco Never    Labs: Review Flowsheet       Latest Ref Rng & Units 03/27/2017 09/29/2019  12/16/2023 12/17/2023  Labs for ITP Cardiac and Pulmonary Rehab  Cholestrol 100 - 199 mg/dL - 789  - -  LDL (calc) 0 - 99 mg/dL - 81  - -  HDL-C >60 mg/dL - 884  - -  Trlycerides 0 - 149 mg/dL - 81  - -  Hemoglobin J8r 4.8 - 5.6 % 5.7  5.7  5.4  5.2   PH, Arterial 7.35 - 7.45 - - - 7.400   PCO2 arterial 32 - 48 mmHg - - - 34.2   Bicarbonate 20.0 - 28.0 mmol/L - - - 21.2  21.8  21.9   TCO2 22 - 32 mmol/L - - - 22  23  23    Acid-base deficit 0.0 - 2.0 mmol/L - - - 3.0  3.0  3.0   O2 Saturation % - - - 96  66  65     Details       Multiple values from one day are sorted in reverse-chronological order         Capillary Blood Glucose: Lab Results  Component Value Date   GLUCAP 84 12/17/2023   GLUCAP 85 12/14/2023     Exercise Target Goals: Exercise Program Goal: Individual exercise prescription set using results from initial 6 min walk test and THRR while considering  patient's activity barriers and safety.   Exercise Prescription Goal: Initial exercise prescription builds to 30-45 minutes a day of aerobic activity,  2-3 days per week.  Home exercise guidelines will be given to patient during program as part of exercise prescription that the participant will acknowledge.  Activity Barriers & Risk Stratification:  Activity Barriers & Cardiac Risk Stratification - 05/15/24 1519       Activity Barriers & Cardiac Risk Stratification   Activity Barriers Balance Concerns;Joint Problems;Arthritis;Back Problems;Deconditioning;Decreased Ventricular Function    Cardiac Risk Stratification High          6 Minute Walk:  6 Minute Walk     Row Name 05/15/24 1159         6 Minute Walk   Phase Initial     Distance 1010 feet     Walk Time 6 minutes     # of Rest Breaks 0     MPH 1.91     METS 2.22     RPE 11     Perceived Dyspnea  0     VO2 Peak 7.8     Symptoms No     Resting HR 73 bpm     Resting BP 104/62     Resting Oxygen Saturation  99 %     Exercise Oxygen  Saturation  during 6 min walk 99 %     Max Ex. HR 88 bpm     Max Ex. BP 122/80     2 Minute Post BP 110/70        Oxygen Initial Assessment:   Oxygen Re-Evaluation:   Oxygen Discharge (Final Oxygen Re-Evaluation):   Initial Exercise Prescription:  Initial Exercise Prescription - 05/15/24 1500       Date of Initial Exercise RX and Referring Provider   Date 05/15/24    Referring Provider [name], MD    Expected Discharge Date 08/06/24      NuStep   Level 1    SPM 70    Minutes 15    METs 1.8      Track   Laps 10    Minutes 15    METs 2.5      Prescription Details   Frequency (times per week) 3    Duration Progress to 30 minutes of continuous aerobic without signs/symptoms of physical distress      Intensity   THRR 40-80% of Max Heartrate 63-126    Ratings of Perceived Exertion 11-13    Perceived Dyspnea 0-4      Progression   Progression Continue progressive overload as per policy without signs/symptoms or physical distress.      Resistance Training   Training Prescription Yes    Reps 10-15          Perform Capillary Blood Glucose checks as needed.  Exercise Prescription Changes:   Exercise Comments:   Exercise Goals and Review:   Exercise Goals     Row Name 05/15/24 1521             Exercise Goals   Increase Physical Activity Yes       Intervention Provide advice, education, support and counseling about physical activity/exercise needs.;Develop an individualized exercise prescription for aerobic and resistive training based on initial evaluation findings, risk stratification, comorbidities and participant's personal goals.       Expected Outcomes Short Term: Attend rehab on a regular basis to increase amount of physical activity.;Long Term: Exercising regularly at least 3-5 days a week.;Long Term: Add in home exercise to make exercise part of routine and to increase amount of physical activity.       Increase  Strength and Stamina  Yes       Intervention Provide advice, education, support and counseling about physical activity/exercise needs.;Develop an individualized exercise prescription for aerobic and resistive training based on initial evaluation findings, risk stratification, comorbidities and participant's personal goals.       Expected Outcomes Short Term: Increase workloads from initial exercise prescription for resistance, speed, and METs.;Short Term: Perform resistance training exercises routinely during rehab and add in resistance training at home;Long Term: Improve cardiorespiratory fitness, muscular endurance and strength as measured by increased METs and functional capacity ( )       Able to understand and use rate of perceived exertion (RPE) scale Yes       Intervention Provide education and explanation on how to use RPE scale       Expected Outcomes Short Term: Able to use RPE daily in rehab to express subjective intensity level;Long Term:  Able to use RPE to guide intensity level when exercising independently       Knowledge and understanding of Target Heart Rate Range (THRR) Yes       Intervention Provide education and explanation of THRR including how the numbers were predicted and where they are located for reference       Expected Outcomes Short Term: Able to state/look up THRR;Long Term: Able to use THRR to govern intensity when exercising independently;Short Term: Able to use daily as guideline for intensity in rehab       Understanding of Exercise Prescription Yes       Intervention Provide education, explanation, and written materials on patient's individual exercise prescription       Expected Outcomes Short Term: Able to explain program exercise prescription;Long Term: Able to explain home exercise prescription to exercise independently          Exercise Goals Re-Evaluation :   Discharge Exercise Prescription (Final Exercise Prescription Changes):   Nutrition:  Target Goals: Understanding of  nutrition guidelines, daily intake of sodium 1500mg , cholesterol 200mg , calories 30% from fat and 7% or less from saturated fats, daily to have 5 or more servings of fruits and vegetables.  Biometrics:  Pre Biometrics - 05/15/24 1100       Pre Biometrics   Waist Circumference 41 inches    Hip Circumference 50 inches    Waist to Hip Ratio 0.82 %    Triceps Skinfold 31 mm    % Body Fat 44.2 %    Grip Strength 32 kg    Flexibility 16 in    Single Leg Stand 10 seconds           Nutrition Therapy Plan and Nutrition Goals:   Nutrition Assessments:  MEDIFICTS Score Key: >=70 Need to make dietary changes  40-70 Heart Healthy Diet <= 40 Therapeutic Level Cholesterol Diet    Picture Your Plate Scores: <59 Unhealthy dietary pattern with much room for improvement. 41-50 Dietary pattern unlikely to meet recommendations for good health and room for improvement. 51-60 More healthful dietary pattern, with some room for improvement.  >60 Healthy dietary pattern, although there may be some specific behaviors that could be improved.    Nutrition Goals Re-Evaluation:   Nutrition Goals Re-Evaluation:   Nutrition Goals Discharge (Final Nutrition Goals Re-Evaluation):   Psychosocial: Target Goals: Acknowledge presence or absence of significant depression and/or stress, maximize coping skills, provide positive support system. Participant is able to verbalize types and ability to use techniques and skills needed for reducing stress and depression.  Initial Review & Psychosocial Screening:  Initial Psych Review & Screening - 05/15/24 1552       Initial Review   Current issues with None Identified      Family Dynamics   Good Support System? Yes   Kids and husband     Screening Interventions   Interventions Encouraged to exercise;To provide support and resources with identified psychosocial needs;Provide feedback about the scores to participant    Expected Outcomes Long Term  Goal: Stressors or current issues are controlled or eliminated.;Short Term goal: Identification and review with participant of any Quality of Life or Depression concerns found by scoring the questionnaire.;Long Term goal: The participant improves quality of Life and PHQ9 Scores as seen by post scores and/or verbalization of changes          Quality of Life Scores:  Quality of Life - 05/15/24 1504       Quality of Life   Select Quality of Life      Quality of Life Scores   Health/Function Pre 26 %    Socioeconomic Pre 25.06 %    Psych/Spiritual Pre 29.58 %    Family Pre 28.8 %    GLOBAL Pre 27.09 %         Scores of 19 and below usually indicate a poorer quality of life in these areas.  A difference of  2-3 points is a clinically meaningful difference.  A difference of 2-3 points in the total score of the Quality of Life Index has been associated with significant improvement in overall quality of life, self-image, physical symptoms, and general health in studies assessing change in quality of life.  PHQ-9: Review Flowsheet  More data exists      05/15/2024 12/26/2023 09/29/2019 04/30/2017 04/23/2017  Depression screen PHQ 2/9  Decreased Interest 0 0 0 0 0  Down, Depressed, Hopeless 0 0 0 0 0  PHQ - 2 Score 0 0 0 0 0  Altered sleeping 0 - - - -  Tired, decreased energy 0 - - - -  Change in appetite 0 - - - -  Feeling bad or failure about yourself  0 - - - -  Trouble concentrating 0 - - - -  Moving slowly or fidgety/restless 0 - - - -  Suicidal thoughts 0 - - - -  PHQ-9 Score 0 - - - -   Interpretation of Total Score  Total Score Depression Severity:  1-4 = Minimal depression, 5-9 = Mild depression, 10-14 = Moderate depression, 15-19 = Moderately severe depression, 20-27 = Severe depression   Psychosocial Evaluation and Intervention:   Psychosocial Re-Evaluation:   Psychosocial Discharge (Final Psychosocial Re-Evaluation):   Vocational Rehabilitation: Provide  vocational rehab assistance to qualifying candidates.   Vocational Rehab Evaluation & Intervention:  Vocational Rehab - 05/15/24 1505       Initial Vocational Rehab Evaluation & Intervention   Assessment shows need for Vocational Rehabilitation No   Pt is on Disability         Education: Education Goals: Education classes will be provided on a weekly basis, covering required topics. Participant will state understanding/return demonstration of topics presented.     Core Videos: Exercise    Move It!  Clinical staff conducted group or individual video education with verbal and written material and guidebook.  Patient learns the recommended Pritikin exercise program. Exercise with the goal of living a long, healthy life. Some of the health benefits of exercise include controlled diabetes, healthier blood pressure levels, improved cholesterol levels, improved heart and  lung capacity, improved sleep, and better body composition. Everyone should speak with their doctor before starting or changing an exercise routine.  Biomechanical Limitations Clinical staff conducted group or individual video education with verbal and written material and guidebook.  Patient learns how biomechanical limitations can impact exercise and how we can mitigate and possibly overcome limitations to have an impactful and balanced exercise routine.  Body Composition Clinical staff conducted group or individual video education with verbal and written material and guidebook.  Patient learns that body composition (ratio of muscle mass to fat mass) is a key component to assessing overall fitness, rather than body weight alone. Increased fat mass, especially visceral belly fat, can put us  at increased risk for metabolic syndrome, type 2 diabetes, heart disease, and even death. It is recommended to combine diet and exercise (cardiovascular and resistance training) to improve your body composition. Seek guidance from your  physician and exercise physiologist before implementing an exercise routine.  Exercise Action Plan Clinical staff conducted group or individual video education with verbal and written material and guidebook.  Patient learns the recommended strategies to achieve and enjoy long-term exercise adherence, including variety, self-motivation, self-efficacy, and positive decision making. Benefits of exercise include fitness, good health, weight management, more energy, better sleep, less stress, and overall well-being.  Medical   Heart Disease Risk Reduction Clinical staff conducted group or individual video education with verbal and written material and guidebook.  Patient learns our heart is our most vital organ as it circulates oxygen, nutrients, white blood cells, and hormones throughout the entire body, and carries waste away. Data supports a plant-based eating plan like the Pritikin Program for its effectiveness in slowing progression of and reversing heart disease. The video provides a number of recommendations to address heart disease.   Metabolic Syndrome and Belly Fat  Clinical staff conducted group or individual video education with verbal and written material and guidebook.  Patient learns what metabolic syndrome is, how it leads to heart disease, and how one can reverse it and keep it from coming back. You have metabolic syndrome if you have 3 of the following 5 criteria: abdominal obesity, high blood pressure, high triglycerides, low HDL cholesterol, and high blood sugar.  Hypertension and Heart Disease Clinical staff conducted group or individual video education with verbal and written material and guidebook.  Patient learns that high blood pressure, or hypertension, is very common in the [place] . Hypertension is largely due to excessive salt intake, but other important risk factors include being overweight, physical inactivity, drinking too much alcohol, smoking, and not eating enough  potassium from fruits and vegetables. High blood pressure is a leading risk factor for heart attack, stroke, congestive heart failure, dementia, kidney failure, and premature death. Long-term effects of excessive salt intake include stiffening of the arteries and thickening of heart muscle and organ damage. Recommendations include ways to reduce hypertension and the risk of heart disease.  Diseases of Our Time - Focusing on Diabetes Clinical staff conducted group or individual video education with verbal and written material and guidebook.  Patient learns why the best way to stop diseases of our time is prevention, through food and other lifestyle changes. Medicine (such as prescription pills and surgeries) is often only a Band-Aid on the problem, not a long-term solution. Most common diseases of our time include obesity, type 2 diabetes, hypertension, heart disease, and cancer. The Pritikin Program is recommended and has been proven to help reduce, reverse, and/or prevent the damaging effects of  metabolic syndrome.  Nutrition   Overview of the Pritikin Eating Plan  Clinical staff conducted group or individual video education with verbal and written material and guidebook.  Patient learns about the Pritikin Eating Plan for disease risk reduction. The Pritikin Eating Plan emphasizes a wide variety of unrefined, minimally-processed carbohydrates, like fruits, vegetables, whole grains, and legumes. Go, Caution, and Stop food choices are explained. Plant-based and lean animal proteins are emphasized. Rationale provided for low sodium intake for blood pressure control, low added sugars for blood sugar stabilization, and low added fats and oils for coronary artery disease risk reduction and weight management.  Calorie Density  Clinical staff conducted group or individual video education with verbal and written material and guidebook.  Patient learns about calorie density and how it impacts the Pritikin Eating  Plan. Knowing the characteristics of the food you choose will help you decide whether those foods will lead to weight gain or weight loss, and whether you want to consume more or less of them. Weight loss is usually a side effect of the Pritikin Eating Plan because of its focus on low calorie-dense foods.  Label Reading  Clinical staff conducted group or individual video education with verbal and written material and guidebook.  Patient learns about the Pritikin recommended label reading guidelines and corresponding recommendations regarding calorie density, added sugars, sodium content, and whole grains.  Dining Out - Part 1  Clinical staff conducted group or individual video education with verbal and written material and guidebook.  Patient learns that restaurant meals can be sabotaging because they can be so high in calories, fat, sodium, and/or sugar. Patient learns recommended strategies on how to positively address this and avoid unhealthy pitfalls.  Facts on Fats  Clinical staff conducted group or individual video education with verbal and written material and guidebook.  Patient learns that lifestyle modifications can be just as effective, if not more so, as many medications for lowering your risk of heart disease. A Pritikin lifestyle can help to reduce your risk of inflammation and atherosclerosis (cholesterol build-up, or plaque, in the artery walls). Lifestyle interventions such as dietary choices and physical activity address the cause of atherosclerosis. A review of the types of fats and their impact on blood cholesterol levels, along with dietary recommendations to reduce fat intake is also included.  Nutrition Action Plan  Clinical staff conducted group or individual video education with verbal and written material and guidebook.  Patient learns how to incorporate Pritikin recommendations into their lifestyle. Recommendations include planning and keeping personal health goals in mind  as an important part of their success.  Healthy Mind-Set    Healthy Minds, Bodies, Hearts  Clinical staff conducted group or individual video education with verbal and written material and guidebook.  Patient learns how to identify when they are stressed. Video will discuss the impact of that stress, as well as the many benefits of stress management. Patient will also be introduced to stress management techniques. The way we think, act, and feel has an impact on our hearts.  How Our Thoughts Can Heal Our Hearts  Clinical staff conducted group or individual video education with verbal and written material and guidebook.  Patient learns that negative thoughts can cause depression and anxiety. This can result in negative lifestyle behavior and serious health problems. Cognitive behavioral therapy is an effective method to help control our thoughts in order to change and improve our emotional outlook.  Additional Videos:  Exercise    Improving Performance  Clinical staff conducted group or individual video education with verbal and written material and guidebook.  Patient learns to use a non-linear approach by alternating intensity levels and lengths of time spent exercising to help burn more calories and lose more body fat. Cardiovascular exercise helps improve heart health, metabolism, hormonal balance, blood sugar control, and recovery from fatigue. Resistance training improves strength, endurance, balance, coordination, reaction time, metabolism, and muscle mass. Flexibility exercise improves circulation, posture, and balance. Seek guidance from your physician and exercise physiologist before implementing an exercise routine and learn your capabilities and proper form for all exercise.  Introduction to Yoga  Clinical staff conducted group or individual video education with verbal and written material and guidebook.  Patient learns about yoga, a discipline of the coming together of mind, breath,  and body. The benefits of yoga include improved flexibility, improved range of motion, better posture and core strength, increased lung function, weight loss, and positive self-image. Yoga's heart health benefits include lowered blood pressure, healthier heart rate, decreased cholesterol and triglyceride levels, improved immune function, and reduced stress. Seek guidance from your physician and exercise physiologist before implementing an exercise routine and learn your capabilities and proper form for all exercise.  Medical   Aging: Enhancing Your Quality of Life  Clinical staff conducted group or individual video education with verbal and written material and guidebook.  Patient learns key strategies and recommendations to stay in good physical health and enhance quality of life, such as prevention strategies, having an advocate, securing a Health Care Proxy and Power of Attorney, and keeping a list of medications and system for tracking them. It also discusses how to avoid risk for bone loss.  Biology of Weight Control  Clinical staff conducted group or individual video education with verbal and written material and guidebook.  Patient learns that weight gain occurs because we consume more calories than we burn (eating more, moving less). Even if your body weight is normal, you may have higher ratios of fat compared to muscle mass. Too much body fat puts you at increased risk for cardiovascular disease, heart attack, stroke, type 2 diabetes, and obesity-related cancers. In addition to exercise, following the Pritikin Eating Plan can help reduce your risk.  Decoding Lab Results  Clinical staff conducted group or individual video education with verbal and written material and guidebook.  Patient learns that lab test reflects one measurement whose values change over time and are influenced by many factors, including medication, stress, sleep, exercise, food, hydration, pre-existing medical conditions,  and more. It is recommended to use the knowledge from this video to become more involved with your lab results and evaluate your numbers to speak with your doctor.   Diseases of Our Time - Overview  Clinical staff conducted group or individual video education with verbal and written material and guidebook.  Patient learns that according to the CDC, 50% to 70% of chronic diseases (such as obesity, type 2 diabetes, elevated lipids, hypertension, and heart disease) are avoidable through lifestyle improvements including healthier food choices, listening to satiety cues, and increased physical activity.  Sleep Disorders Clinical staff conducted group or individual video education with verbal and written material and guidebook.  Patient learns how good quality and duration of sleep are important to overall health and well-being. Patient also learns about sleep disorders and how they impact health along with recommendations to address them, including discussing with a physician.  Nutrition  Dining Out - Part 2 Clinical staff conducted group or individual  video education with verbal and written material and guidebook.  Patient learns how to plan ahead and communicate in order to maximize their dining experience in a healthy and nutritious manner. Included are recommended food choices based on the type of restaurant the patient is visiting.   Fueling a Banker conducted group or individual video education with verbal and written material and guidebook.  There is a strong connection between our food choices and our health. Diseases like obesity and type 2 diabetes are very prevalent and are in large-part due to lifestyle choices. The Pritikin Eating Plan provides plenty of food and hunger-curbing satisfaction. It is easy to follow, affordable, and helps reduce health risks.  Menu Workshop  Clinical staff conducted group or individual video education with verbal and written material  and guidebook.  Patient learns that restaurant meals can sabotage health goals because they are often packed with calories, fat, sodium, and sugar. Recommendations include strategies to plan ahead and to communicate with the manager, chef, or server to help order a healthier meal.  Planning Your Eating Strategy  Clinical staff conducted group or individual video education with verbal and written material and guidebook.  Patient learns about the Pritikin Eating Plan and its benefit of reducing the risk of disease. The Pritikin Eating Plan does not focus on calories. Instead, it emphasizes high-quality, nutrient-rich foods. By knowing the characteristics of the foods, we choose, we can determine their calorie density and make informed decisions.  Targeting Your Nutrition Priorities  Clinical staff conducted group or individual video education with verbal and written material and guidebook.  Patient learns that lifestyle habits have a tremendous impact on disease risk and progression. This video provides eating and physical activity recommendations based on your personal health goals, such as reducing LDL cholesterol, losing weight, preventing or controlling type 2 diabetes, and reducing high blood pressure.  Vitamins and Minerals  Clinical staff conducted group or individual video education with verbal and written material and guidebook.  Patient learns different ways to obtain key vitamins and minerals, including through a recommended healthy diet. It is important to discuss all supplements you take with your doctor.   Healthy Mind-Set    Smoking Cessation  Clinical staff conducted group or individual video education with verbal and written material and guidebook.  Patient learns that cigarette smoking and tobacco addiction pose a serious health risk which affects millions of people. Stopping smoking will significantly reduce the risk of heart disease, lung disease, and many forms of cancer.  Recommended strategies for quitting are covered, including working with your doctor to develop a successful plan.  Culinary   Becoming a Set designer conducted group or individual video education with verbal and written material and guidebook.  Patient learns that cooking at home can be healthy, cost-effective, quick, and puts them in control. Keys to cooking healthy recipes will include looking at your recipe, assessing your equipment needs, planning ahead, making it simple, choosing cost-effective seasonal ingredients, and limiting the use of added fats, salts, and sugars.  Cooking - Breakfast and Snacks  Clinical staff conducted group or individual video education with verbal and written material and guidebook.  Patient learns how important breakfast is to satiety and nutrition through the entire day. Recommendations include key foods to eat during breakfast to help stabilize blood sugar levels and to prevent overeating at meals later in the day. Planning ahead is also a key component.  Cooking - Public affairs consultant  Clinical staff conducted group or individual video education with verbal and written material and guidebook.  Patient learns eating strategies to improve overall health, including an approach to cook more at home. Recommendations include thinking of animal protein as a side on your plate rather than center stage and focusing instead on lower calorie dense options like vegetables, fruits, whole grains, and plant-based proteins, such as beans. Making sauces in large quantities to freeze for later and leaving the skin on your vegetables are also recommended to maximize your experience.  Cooking - Healthy Salads and Dressing Clinical staff conducted group or individual video education with verbal and written material and guidebook.  Patient learns that vegetables, fruits, whole grains, and legumes are the foundations of the Pritikin Eating Plan. Recommendations include how  to incorporate each of these in flavorful and healthy salads, and how to create homemade salad dressings. Proper handling of ingredients is also covered. Cooking - Soups and [employer] - Soups and Desserts Clinical staff conducted group or individual video education with verbal and written material and guidebook.  Patient learns that Pritikin soups and desserts make for easy, nutritious, and delicious snacks and meal components that are low in sodium, fat, sugar, and calorie density, while high in vitamins, minerals, and filling fiber. Recommendations include simple and healthy ideas for soups and desserts.   Overview     The Pritikin Solution Program Overview Clinical staff conducted group or individual video education with verbal and written material and guidebook.  Patient learns that the results of the Pritikin Program have been documented in more than 100 articles published in peer-reviewed journals, and the benefits include reducing risk factors for (and, in some cases, even reversing) high cholesterol, high blood pressure, type 2 diabetes, obesity, and more! An overview of the three key pillars of the Pritikin Program will be covered: eating well, doing regular exercise, and having a healthy mind-set.  WORKSHOPS  Exercise: Exercise Basics: Building Your Action Plan Clinical staff led group instruction and group discussion with PowerPoint presentation and patient guidebook. To enhance the learning environment the use of posters, models and videos may be added. At the conclusion of this workshop, patients will comprehend the difference between physical activity and exercise, as well as the benefits of incorporating both, into their routine. Patients will understand the FITT (Frequency, Intensity, Time, and Type) principle and how to use it to build an exercise action plan. In addition, safety concerns and other considerations for exercise and cardiac rehab will be addressed by the  presenter. The purpose of this lesson is to promote a comprehensive and effective weekly exercise routine in order to improve patients' overall level of fitness.   Managing Heart Disease: Your Path to a Healthier Heart Clinical staff led group instruction and group discussion with PowerPoint presentation and patient guidebook. To enhance the learning environment the use of posters, models and videos may be added.At the conclusion of this workshop, patients will understand the anatomy and physiology of the heart. Additionally, they will understand how Pritikin's three pillars impact the risk factors, the progression, and the management of heart disease.  The purpose of this lesson is to provide a high-level overview of the heart, heart disease, and how the Pritikin lifestyle positively impacts risk factors.  Exercise Biomechanics Clinical staff led group instruction and group discussion with PowerPoint presentation and patient guidebook. To enhance the learning environment the use of posters, models and videos may be added. Patients will learn how the structural parts of  their bodies function and how these functions impact their daily activities, movement, and exercise. Patients will learn how to promote a neutral spine, learn how to manage pain, and identify ways to improve their physical movement in order to promote healthy living. The purpose of this lesson is to expose patients to common physical limitations that impact physical activity. Participants will learn practical ways to adapt and manage aches and pains, and to minimize their effect on regular exercise. Patients will learn how to maintain good posture while sitting, walking, and lifting.  Balance Training and Fall Prevention  Clinical staff led group instruction and group discussion with PowerPoint presentation and patient guidebook. To enhance the learning environment the use of posters, models and videos may be added. At the  conclusion of this workshop, patients will understand the importance of their sensorimotor skills (vision, proprioception, and the vestibular system) in maintaining their ability to balance as they age. Patients will apply a variety of balancing exercises that are appropriate for their current level of function. Patients will understand the common causes for poor balance, possible solutions to these problems, and ways to modify their physical environment in order to minimize their fall risk. The purpose of this lesson is to teach patients about the importance of maintaining balance as they age and ways to minimize their risk of falling.  WORKSHOPS   Nutrition:  Fueling a Ship broker led group instruction and group discussion with PowerPoint presentation and patient guidebook. To enhance the learning environment the use of posters, models and videos may be added. Patients will review the foundational principles of the Pritikin Eating Plan and understand what constitutes a serving size in each of the food groups. Patients will also learn Pritikin-friendly foods that are better choices when away from home and review make-ahead meal and snack options. Calorie density will be reviewed and applied to three nutrition priorities: weight maintenance, weight loss, and weight gain. The purpose of this lesson is to reinforce (in a group setting) the key concepts around what patients are recommended to eat and how to apply these guidelines when away from home by planning and selecting Pritikin-friendly options. Patients will understand how calorie density may be adjusted for different weight management goals.  Mindful Eating  Clinical staff led group instruction and group discussion with PowerPoint presentation and patient guidebook. To enhance the learning environment the use of posters, models and videos may be added. Patients will briefly review the concepts of the Pritikin Eating Plan and the  importance of low-calorie dense foods. The concept of mindful eating will be introduced as well as the importance of paying attention to internal hunger signals. Triggers for non-hunger eating and techniques for dealing with triggers will be explored. The purpose of this lesson is to provide patients with the opportunity to review the basic principles of the Pritikin Eating Plan, discuss the value of eating mindfully and how to measure internal cues of hunger and fullness using the Hunger Scale. Patients will also discuss reasons for non-hunger eating and learn strategies to use for controlling emotional eating.  Targeting Your Nutrition Priorities Clinical staff led group instruction and group discussion with PowerPoint presentation and patient guidebook. To enhance the learning environment the use of posters, models and videos may be added. Patients will learn how to determine their genetic susceptibility to disease by reviewing their family history. Patients will gain insight into the importance of diet as part of an overall healthy lifestyle in mitigating the impact of genetics  and other environmental insults. The purpose of this lesson is to provide patients with the opportunity to assess their personal nutrition priorities by looking at their family history, their own health history and current risk factors. Patients will also be able to discuss ways of prioritizing and modifying the Pritikin Eating Plan for their highest risk areas  Menu  Clinical staff led group instruction and group discussion with PowerPoint presentation and patient guidebook. To enhance the learning environment the use of posters, models and videos may be added. Using menus brought in from [employer], or printed from [employer], patients will apply the Pritikin dining out guidelines that were presented in the [employer] video. Patients will also be able to practice these guidelines in a variety of  provided scenarios. The purpose of this lesson is to provide patients with the opportunity to practice hands-on learning of the Pritikin Dining Out guidelines with actual menus and practice scenarios.  Label Reading Clinical staff led group instruction and group discussion with PowerPoint presentation and patient guidebook. To enhance the learning environment the use of posters, models and videos may be added. Patients will review and discuss the Pritikin label reading guidelines presented in Pritikin's Label Reading Educational series video. Using fool labels brought in from local grocery stores and markets, patients will apply the label reading guidelines and determine if the packaged food meet the Pritikin guidelines. The purpose of this lesson is to provide patients with the opportunity to review, discuss, and practice hands-on learning of the Pritikin Label Reading guidelines with actual packaged food labels. Cooking School  Pritikin's [employer] are designed to teach patients ways to prepare quick, simple, and affordable recipes at home. The importance of nutrition's role in chronic disease risk reduction is reflected in its emphasis in the overall Pritikin program. By learning how to prepare essential core Pritikin Eating Plan recipes, patients will increase control over what they eat; be able to customize the flavor of foods without the use of added salt, sugar, or fat; and improve the quality of the food they consume. By learning a set of core recipes which are easily assembled, quickly prepared, and affordable, patients are more likely to prepare more healthy foods at home. These workshops focus on convenient breakfasts, simple entres, side dishes, and desserts which can be prepared with minimal effort and are consistent with nutrition recommendations for cardiovascular risk reduction. Cooking [employer] are taught by a Armed forces logistics/support/administrative officer (RD) who has been trained by the  [employer]. The chef or RD has a clear understanding of the importance of minimizing - if not completely eliminating - added fat, sugar, and sodium in recipes. Throughout the series of Cooking School Workshop sessions, patients will learn about healthy ingredients and efficient methods of cooking to build confidence in their capability to prepare    Cooking School weekly topics:  Adding Flavor- Sodium-Free  Fast and Healthy Breakfasts  Powerhouse Plant-Based Proteins  Satisfying Salads and Dressings  Simple Sides and Sauces  International Cuisine-Spotlight on the [employer] Zones  Delicious Desserts  Savory Soups  [employer] - Meals in a Astronomer Appetizers and Snacks  Comforting Weekend Breakfasts  One-Pot Wonders   Fast Evening Meals  Landscape architect Your Pritikin Plate  WORKSHOPS   Healthy Mindset (Psychosocial):  Focused Goals, Sustainable Changes Clinical staff led group instruction and group discussion with PowerPoint presentation and patient guidebook. To enhance the learning environment the use of posters,  models and videos may be added. Patients will be able to apply effective goal setting strategies to establish at least one personal goal, and then take consistent, meaningful action toward that goal. They will learn to identify common barriers to achieving personal goals and develop strategies to overcome them. Patients will also gain an understanding of how our mind-set can impact our ability to achieve goals and the importance of cultivating a positive and growth-oriented mind-set. The purpose of this lesson is to provide patients with a deeper understanding of how to set and achieve personal goals, as well as the tools and strategies needed to overcome common obstacles which may arise along the way.  From Head to Heart: The Power of a Healthy Outlook  Clinical staff led group instruction and group discussion with PowerPoint presentation  and patient guidebook. To enhance the learning environment the use of posters, models and videos may be added. Patients will be able to recognize and describe the impact of emotions and mood on physical health. They will discover the importance of self-care and explore self-care practices which may work for them. Patients will also learn how to utilize the 4 C's to cultivate a healthier outlook and better manage stress and challenges. The purpose of this lesson is to demonstrate to patients how a healthy outlook is an essential part of maintaining good health, especially as they continue their cardiac rehab journey.  Healthy Sleep for a Healthy Heart Clinical staff led group instruction and group discussion with PowerPoint presentation and patient guidebook. To enhance the learning environment the use of posters, models and videos may be added. At the conclusion of this workshop, patients will be able to demonstrate knowledge of the importance of sleep to overall health, well-being, and quality of life. They will understand the symptoms of, and treatments for, common sleep disorders. Patients will also be able to identify daytime and nighttime behaviors which impact sleep, and they will be able to apply these tools to help manage sleep-related challenges. The purpose of this lesson is to provide patients with a general overview of sleep and outline the importance of quality sleep. Patients will learn about a few of the most common sleep disorders. Patients will also be introduced to the concept of "sleep hygiene," and discover ways to self-manage certain sleeping problems through simple daily behavior changes. Finally, the workshop will motivate patients by clarifying the links between quality sleep and their goals of heart-healthy living.   Recognizing and Reducing Stress Clinical staff led group instruction and group discussion with PowerPoint presentation and patient guidebook. To enhance the learning  environment the use of posters, models and videos may be added. At the conclusion of this workshop, patients will be able to understand the types of stress reactions, differentiate between acute and chronic stress, and recognize the impact that chronic stress has on their health. They will also be able to apply different coping mechanisms, such as reframing negative self-talk. Patients will have the opportunity to practice a variety of stress management techniques, such as deep abdominal breathing, progressive muscle relaxation, and/or guided imagery.  The purpose of this lesson is to educate patients on the role of stress in their lives and to provide healthy techniques for coping with it.  Learning Barriers/Preferences:  Learning Barriers/Preferences - 05/15/24 1506       Learning Barriers/Preferences   Learning Barriers None    Learning Preferences Audio;Computer/Internet;Group Instruction;Individual Instruction;Skilled Demonstration;Pictoral;Verbal Instruction;Video;Written Material          Education Topics:  Knowledge Questionnaire Score:  Knowledge Questionnaire Score - 05/15/24 1505       Knowledge Questionnaire Score   Pre Score 21/24          Core Components/Risk Factors/Patient Goals at Admission:  Personal Goals and Risk Factors at Admission - 05/15/24 1547       Core Components/Risk Factors/Patient Goals on Admission    Weight Management Yes;Weight Loss    Intervention Weight Management: Develop a combined nutrition and exercise program designed to reach desired caloric intake, while maintaining appropriate intake of nutrient and fiber, sodium and fats, and appropriate energy expenditure required for the weight goal.;Weight Management: Provide education and appropriate resources to help participant work on and attain dietary goals.    Expected Outcomes Short Term: Continue to assess and modify interventions until short term weight is achieved;Long Term: Adherence to  nutrition and physical activity/exercise program aimed toward attainment of established weight goal;Weight Loss: Understanding of general recommendations for a balanced deficit meal plan, which promotes 1-2 lb weight loss per week and includes a negative energy balance of [phone] kcal/d;Understanding recommendations for meals to include 15-35% energy as protein, 25-35% energy from fat, 35-60% energy from carbohydrates, less than 200mg  of dietary cholesterol, 20-35 gm of total fiber daily;Understanding of distribution of calorie intake throughout the day with the consumption of 4-5 meals/snacks    Heart Failure Yes    Intervention Provide a combined exercise and nutrition program that is supplemented with education, support and counseling about heart failure. Directed toward relieving symptoms such as shortness of breath, decreased exercise tolerance, and extremity edema.    Expected Outcomes Improve functional capacity of life;Short term: Attendance in program 2-3 days a week with increased exercise capacity. Reported lower sodium intake. Reported increased fruit and vegetable intake. Reports medication compliance.;Short term: Daily weights obtained and reported for increase. Utilizing diuretic protocols set by physician.;Long term: Adoption of self-care skills and reduction of barriers for early signs and symptoms recognition and intervention leading to self-care maintenance.    Hypertension Yes    Intervention Provide education on lifestyle modifcations including regular physical activity/exercise, weight management, moderate sodium restriction and increased consumption of fresh fruit, vegetables, and low fat dairy, alcohol moderation, and smoking cessation.;Monitor prescription use compliance.    Expected Outcomes Short Term: Continued assessment and intervention until BP is < 140/14mm HG in hypertensive participants. < 130/14mm HG in hypertensive participants with diabetes, heart failure or chronic kidney  disease.;Long Term: Maintenance of blood pressure at goal levels.    Lipids Yes    Intervention Provide education and support for participant on nutrition & aerobic/resistive exercise along with prescribed medications to achieve LDL 70mg , HDL >40mg .    Expected Outcomes Short Term: Participant states understanding of desired cholesterol values and is compliant with medications prescribed. Participant is following exercise prescription and nutrition guidelines.;Long Term: Cholesterol controlled with medications as prescribed, with individualized exercise RX and with personalized nutrition plan. Value goals: LDL < 70mg , HDL > 40 mg.    Personal Goal Other Yes    Personal Goal ST and long term: stabilize weight (lower fluid), eat healthier, increase flexibility    Intervention Will continue to monitor pt and progress workloads as tolerated without sign or symptom    Expected Outcomes Pt will achieve her goals          Core Components/Risk Factors/Patient Goals Review:    Core Components/Risk Factors/Patient Goals at Discharge (Final Review):    ITP Comments:  ITP Comments     Row  Name 05/15/24 1026           ITP Comments ITP Comments Wilbert Bihari, MD: Medical Director. Introduction to the Pritikin Education Program/Intensive Cardiac Rehab. Initial orientation packet reviewed with the patient.          Comments: Participant attended orientation for the cardiac rehabilitation program on  05/15/2024  to perform initial intake and exercise walk test. Patient introduced to the Pritikin Program education and orientation packet was reviewed. Completed 6-minute walk test, measurements, initial ITP, and exercise prescription. Vital signs stable. Telemetry-normal sinus rhythm, asymptomatic.   Service time was from 1024 to 1215.

## 2024-05-15 NOTE — Progress Notes (Signed)
 Cardiac Rehab Medication Review   Does the patient  feel that his/her medications are working for him/her?  yes  Has the patient been experiencing any side effects to the medications prescribed?  no  Does the patient measure his/her own blood pressure or blood glucose at home?  yes   Does the patient have any problems obtaining medications due to transportation or finances?   no  Understanding of regimen: good Understanding of indications: good Potential of compliance: good    Comments: Pt is taking all her medications and is very dedicated to checking her weight daily, and her BP a few times a week.     Leah Hernandez [name] 05/15/2024 3:53 PM

## 2024-05-19 ENCOUNTER — Encounter (HOSPITAL_COMMUNITY): Admission: RE | Admit: 2024-05-19

## 2024-05-21 ENCOUNTER — Encounter (HOSPITAL_COMMUNITY)
Admission: RE | Admit: 2024-05-21 | Discharge: 2024-05-21 | Disposition: A | Source: Ambulatory Visit | Attending: Cardiology

## 2024-05-21 DIAGNOSIS — I5022 Chronic systolic (congestive) heart failure: Secondary | ICD-10-CM

## 2024-05-21 LAB — GLUCOSE, CAPILLARY
Glucose-Capillary: 90 mg/dL (ref 70–99)
Glucose-Capillary: 91 mg/dL (ref 70–99)

## 2024-05-21 NOTE — Progress Notes (Addendum)
 Daily Session Note  Patient Details  Name: Leah Hernandez MRN: [id number] Date of Birth: 31-Oct-1960 Referring Provider:   Flowsheet Row CARDIAC REHAB PHASE II ORIENTATION from 05/15/2024 in [facility]  Referring Provider [name], MD    Encounter Date: 05/21/2024  Check In:  Session Check In - 05/21/24 1003       Check-In   Supervising physician immediately available to respond to emergencies [facility] MD immediately available    Physician(s) Damien Braver, NP    Location [facility]    Staff Present Alec Finder BS, ACSM-CEP, Exercise Physiologist;Johnny Fayette, MS, Exercise Physiologist;Joriel Streety, RN, Mallory Parkins, MS, ACSM-CEP, CCRP, Exercise Physiologist;Olinty Valere, MS, ACSM-CEP, Exercise Physiologist;Joseph Lennon, RN, BSN    Virtual Visit No    Medication changes reported     No    Fall or balance concerns reported    No    Tobacco Cessation No Change    Warm-up and Cool-down Performed as group-led instruction    Resistance Training Performed No    VAD Patient? No    PAD/SET Patient? No      Pain Assessment   Currently in Pain? No/denies    Pain Score 0-No pain    Multiple Pain Sites No          Capillary Blood Glucose: Results for orders placed or performed during the hospital encounter of 05/21/24 (from the past 24 hours)  Glucose, capillary     Status: None   Collection Time: 05/21/24 10:30 AM  Result Value Ref Range   Glucose-Capillary 91 70 - 99 mg/dL  Glucose, capillary     Status: None   Collection Time: 05/21/24 11:21 AM  Result Value Ref Range   Glucose-Capillary 90 70 - 99 mg/dL      Social History   Tobacco Use  Smoking Status Never  Smokeless Tobacco Never    Goals Met:  Exercise tolerated well No signs or symptoms  Goals Unmet:  Not Applicable  Comments: Pt started cardiac rehab today.  Pt tolerated light exercise without difficulty. VSS,  telemetry-Sinus Rhythm, asymptomatic.  Medication list reconciled. Pt denies barriers to medicaiton compliance.  PSYCHOSOCIAL ASSESSMENT:  PHQ-0. Pt exhibits positive coping skills, hopeful outlook with supportive family. No psychosocial needs identified at this time, no psychosocial interventions necessary.    Pt enjoys walking in the park.   Pt oriented to exercise equipment and routine.    Understanding verbalized.Hadassah Elpidio Quan RN BSN    Dr. Wilbert Bihari is Medical Director for Cardiac Rehab at [facility].

## 2024-05-23 ENCOUNTER — Telehealth (HOSPITAL_COMMUNITY): Payer: Self-pay

## 2024-05-23 ENCOUNTER — Encounter (HOSPITAL_COMMUNITY): Admission: RE | Admit: 2024-05-23 | Source: Ambulatory Visit

## 2024-05-23 NOTE — Telephone Encounter (Signed)
 Patient c/o for 10:15 CR class, no reason given.

## 2024-05-26 ENCOUNTER — Encounter (HOSPITAL_COMMUNITY)
Admission: RE | Admit: 2024-05-26 | Discharge: 2024-05-26 | Disposition: A | Discharge status: Home / Self Care | Source: Ambulatory Visit | Attending: Cardiology | Admitting: Cardiology

## 2024-05-26 DIAGNOSIS — I5022 Chronic systolic (congestive) heart failure: Secondary | ICD-10-CM | POA: Diagnosis not present

## 2024-05-27 ENCOUNTER — Other Ambulatory Visit: Payer: Self-pay

## 2024-05-28 ENCOUNTER — Encounter (HOSPITAL_COMMUNITY)

## 2024-05-28 ENCOUNTER — Encounter (HOSPITAL_COMMUNITY)
Admission: RE | Admit: 2024-05-28 | Discharge: 2024-05-28 | Disposition: A | Source: Ambulatory Visit | Attending: Cardiology | Admitting: Cardiology

## 2024-05-30 ENCOUNTER — Encounter (HOSPITAL_COMMUNITY)
Admission: RE | Admit: 2024-05-30 | Discharge: 2024-05-30 | Disposition: A | Discharge status: Home / Self Care | Source: Ambulatory Visit | Attending: Cardiology | Admitting: Cardiology

## 2024-05-30 DIAGNOSIS — I5022 Chronic systolic (congestive) heart failure: Secondary | ICD-10-CM

## 2024-06-01 NOTE — Progress Notes (Incomplete)
 ***In Progress***    Advanced Heart Failure Clinic Note   HPI:  Referring Physician: Theotis Haze ORN, NP  Primary Care: Theotis Haze ORN, NP Primary Cardiologist: Dr. Zenaida   HPI:  Leah Hernandez is a 63 y.o. female who presents for follow up of chronic systolic heart failure.    Admitted 11/2023 with near-syncope. Hgb was found to be 5.1 g/dL. Underwent EGD/colonoscopy showing transverse colon mass concerning for malignancy; s/p biopsy, and gastritis. Pathology showed invasive adenocarcinoma. General surgery was consulted to evaluate for hemicolectomy. Prior to surgery, she required cardiac evaluation. During hospitaliztion, she was noted to have several runs of VT. Subsequently, echocardiogram was ordered and showed EF 20-25%, global hypokinesis, grade I DD, severe asymmetric LVH, RV normal. Cardiology consulted. Underwent R/LHC showing no CAD, stable filling pressures and preserved CI 3.6. She underwent successful R hemicolectomy on 12/18/23.    Presented to AHF clinic for MD visit on 03/24/24. Weight was up mildly but no symptoms of worsening shortness of breath or heart failure. She denied any chest pain, leg swelling, orthopnea. She reported taking all her medications without issues. Denied dizziness or lightheadedness.    ***She returned to HF clinic for pharmacist medication titration on 04/14/24. Patient reported that she has not been cutting any of her tablets in half as prescribed. She was unfamiliar with the indications for each medication, and reported to be taking furosemide  40 mg tablet daily along with full tablets of her other medications. ***  She returned to HF clinic for pharmacist medication titration on 05/05/24. Weight was up 3 lbs in clinic, patient reported weight fluctuated between 203-208 lbs at home. She reported taking furosemide  ~3 times per week. Patient was receiving assistance from family to help monitor her weight and blood pressure. She denied SOB, continued to  walk a mile around the neighborhood every day.  Today she returns to HF clinic for pharmacist medication titration. At last visit, Entresto  was increased to 49/51 mg BID. ***   Shortness of breath/dyspnea on exertion? {YES NO:22349}  Orthopnea/PND? {YES NO:22349} Edema? {YES NO:22349} Lightheadedness/dizziness? {YES NO:22349} Daily weights at home? {YES NO:22349} Blood pressure/heart rate monitoring at home? {YES [id number] Following low-sodium/fluid-restricted diet? {YES NO:22349}  HF Medications: Metoprolol  succinate 100 mg daily *** Entresto  49/51 mg BID *** Spironolactone  25 mg daily *** Farxiga  10 mg daily *** Furosemide  40 mg PRN ***  Has the patient been experiencing any side effects to the medications prescribed?  {YES NO:22349}  Does the patient have any problems obtaining medications due to transportation or finances?   No, patient has [place] Medicaid ***  Understanding of regimen: {excellent/good/fair/poor:19665} Understanding of indications: {excellent/good/fair/poor:19665} Potential of compliance: {excellent/good/fair/poor:19665} Patient understands to avoid NSAIDs. Patient understands to avoid decongestants.    Pertinent Lab Values: (04/18/24) Serum creatinine 1.38 mg/dL, BUN 29 mg/dL, Potassium 4.7 mmol/L, Sodium 138 mmol/L, BNP 65 pg/mL (01/09/24)  Vital Signs: Weight: *** (last clinic weight: 205 lbs) Blood pressure: *** (last clinic BP: 120/78 mmHg) ***  Heart rate: *** (last clinic HR: 70 bpm) ***  Assessment/Plan: Chronic Systolic Heart Failure: Unclear etiology. Mild LGE in the mid inferior and inferolateral walls consistent with infarct, but on review of coronary angiogram there is no significant disease (though poorly visualized anomalous circ). Discussion today about ICD placement. Given minimal LGE, room to titrate GDMT, patient would prefer to continue titration prior to ICD consideration, which is reasonable. - NICM primarily, euvolemic on exam.  -  Continue furosemide  to 40 mg prn. Patient appropriately taking  doses as needed for weight fluctuation, signs of edema, or SOB.  - Continue metoprolol  succinate 100 mg daily. *** - Increase to Entresto  49/51 mg twice daily. Educated patient to continue monitoring BP at home and on the signs of hypotension. Should also help provide some gentle diuresis. *** - Continue spironolactone  25 mg daily. *** - Continue Farxiga  10 mg daily. *** -  *** Follow-up in pharmacy clinic in 4 weeks for continued medication titration and education. Will recheck BMET at this upcoming appointment. ***   Anemia - Most recent Hgb 13.7 on 04/18/2024 - Oncology arranging iron  infusions   Colon Cancer  - Pathology showed invasive adenocarcinoma - s/p hemicolectomy 12/18/23. 28 lymph nodes negative - Surgery was considered curative, but she has high risk features that place her at moderate risk for recurrence.  Follow up ***  Morna Breach, PharmD PGY2 Cardiology Pharmacy Resident

## 2024-06-02 ENCOUNTER — Other Ambulatory Visit: Payer: Self-pay

## 2024-06-02 ENCOUNTER — Encounter (HOSPITAL_COMMUNITY): Payer: Self-pay

## 2024-06-02 ENCOUNTER — Other Ambulatory Visit (HOSPITAL_COMMUNITY): Payer: Self-pay

## 2024-06-02 ENCOUNTER — Ambulatory Visit (HOSPITAL_COMMUNITY)

## 2024-06-02 ENCOUNTER — Ambulatory Visit (HOSPITAL_COMMUNITY)
Admission: RE | Admit: 2024-06-02 | Discharge: 2024-06-02 | Disposition: A | Discharge status: Home / Self Care | Source: Ambulatory Visit | Attending: Cardiology | Admitting: Cardiology

## 2024-06-02 ENCOUNTER — Encounter (HOSPITAL_COMMUNITY)
Admission: RE | Admit: 2024-06-02 | Discharge: 2024-06-02 | Disposition: A | Discharge status: Home / Self Care | Source: Ambulatory Visit | Attending: Cardiology | Admitting: Cardiology

## 2024-06-02 VITALS — BP 96/58 | HR 75 | Wt 212.4 lb

## 2024-06-02 DIAGNOSIS — I5022 Chronic systolic (congestive) heart failure: Secondary | ICD-10-CM | POA: Insufficient documentation

## 2024-06-02 DIAGNOSIS — N179 Acute kidney failure, unspecified: Secondary | ICD-10-CM

## 2024-06-02 DIAGNOSIS — C184 Malignant neoplasm of transverse colon: Secondary | ICD-10-CM | POA: Diagnosis not present

## 2024-06-02 DIAGNOSIS — D649 Anemia, unspecified: Secondary | ICD-10-CM | POA: Insufficient documentation

## 2024-06-02 DIAGNOSIS — I5021 Acute systolic (congestive) heart failure: Secondary | ICD-10-CM

## 2024-06-02 LAB — BASIC METABOLIC PANEL WITH GFR
Anion gap: 11 (ref 5–15)
BUN: 43 mg/dL — ABNORMAL HIGH (ref 8–23)
CO2: 24 mmol/L (ref 22–32)
Calcium: 9.3 mg/dL (ref 8.9–10.3)
Chloride: 102 mmol/L (ref 98–111)
Creatinine, Ser: 1.82 mg/dL — ABNORMAL HIGH (ref 0.44–1.00)
GFR, Estimated: 31 mL/min — ABNORMAL LOW (ref >60)
Glucose, Bld: 117 mg/dL — ABNORMAL HIGH (ref 70–99)
Potassium: 5.2 mmol/L — ABNORMAL HIGH (ref 3.5–5.1)
Sodium: 137 mmol/L (ref 135–145)

## 2024-06-02 MED ORDER — SACUBITRIL-VALSARTAN 24-26 MG PO TABS
1.0000 | ORAL_TABLET | Freq: Two times a day (BID) | ORAL | 2 refills | Status: DC
  Filled 2024-06-02: qty 60, 30d supply, fill #0
  Filled 2024-06-29: qty 60, 30d supply, fill #1
  Filled 2024-07-28: qty 60, 30d supply, fill #2

## 2024-06-02 NOTE — Patient Instructions (Addendum)
 It was a pleasure seeing you today!  MEDICATIONS: -DECREASE Entresto  back to 24-26 mg by mouth twice daily  -Call if you have questions about your medications.  LABS: -We will call you if your labs need attention.  NEXT APPOINTMENT: Return to clinic in 2 weeks with Dr. Zenaida.  In general, to take care of your heart failure: -Limit your fluid intake to 2 Liters (half-gallon) per day.   -Limit your salt intake to ideally 2-3 grams (2000-3000 mg) per day. -Weigh yourself daily and record, and bring that weight diary to your next appointment.  (Weight gain of 2-3 pounds in 1 day typically means fluid weight.) -The medications for your heart are to help your heart and help you live longer.   -Please contact us  before stopping any of your heart medications.  Call the clinic at [phone] with questions or to reschedule future appointments.

## 2024-06-02 NOTE — Progress Notes (Signed)
 Advanced Heart Failure Clinic Note   Referring Physician: Theotis Haze ORN, NP  Primary Care: Theotis Haze ORN, NP Primary Cardiologist: Dr. Zenaida   HPI:  Leah Hernandez is a 63 y.o. female who presents for follow up of chronic systolic heart failure.    Admitted 11/2023 with near-syncope. Hgb was found to be 5.1 g/dL. Underwent EGD/colonoscopy showing transverse colon mass concerning for malignancy; s/p biopsy, and gastritis. Pathology showed invasive adenocarcinoma. General surgery was consulted to evaluate for hemicolectomy. Prior to surgery, she required cardiac evaluation. During hospitaliztion, she was noted to have several runs of VT. Subsequently, echocardiogram was ordered and showed EF 20-25%, global hypokinesis, grade I DD, severe asymmetric LVH, RV normal. Cardiology consulted. Underwent R/LHC showing no CAD, stable filling pressures and preserved CI 3.6. She underwent successful R hemicolectomy on 12/18/23.    Presented to AHF clinic for MD visit on 03/24/24. Weight was up mildly but no symptoms of worsening shortness of breath or heart failure. She denied any chest pain, leg swelling, orthopnea. She reported taking all her medications without issues. Denied dizziness or lightheadedness.   She returned to HF clinic for pharmacist medication titration on 05/05/24. Weight was up 3 lbs in clinic, patient reported weight fluctuated between 203-208 lbs at home. She reported taking furosemide  ~3 times per week. Patient was receiving assistance from family to help monitor her weight and blood pressure. She denied SOB, continued to walk a mile around the neighborhood every day.  Today she returns to HF clinic with her daughter for pharmacist medication titration. At last visit, Entresto  was increased to 49/51 mg BID. Overall, patient is doing well. Patient reports going to cardiac rehab three times per week. Noted her SBP is typically ~100 mmHg at cardiac rehab, BP down to 96/58 mmHg in  clinic today. Denies dizziness, lightheadedness, or fatigue. Denies chest pain or palpitations. Denies SOB, PND, or orthopnea. No LEE noted. Weight up to 212 lbs in clinic today, patient reports weight at home typically around 208 lbs. Patient reports adherence to her medications. Patient reports taking her furosemide  every other day. She reports taking all of her medications this morning, including the furosemide .   HF Medications: Metoprolol  succinate 100 mg daily Entresto  49/51 mg BID  Spironolactone  25 mg daily  Farxiga  10 mg daily  Furosemide  40 mg PRN - reports taking every other day  Has the patient been experiencing any side effects to the medications prescribed?  None reported  Does the patient have any problems obtaining medications due to transportation or finances?   No, patient has [place] Medicaid  Understanding of regimen: good Understanding of indications: good Potential of compliance: good Patient understands to avoid NSAIDs. Patient understands to avoid decongestants.    Pertinent Lab Values: (04/18/24) Serum creatinine 1.38 mg/dL, BUN 29 mg/dL, Potassium 4.7 mmol/L, Sodium 138 mmol/L, BNP 65 pg/mL (01/09/24)  Vital Signs: Weight: 212 lbs (last clinic weight: 205 lbs) Blood pressure: 96/58 mmHg Heart rate: 75 bpm  Assessment/Plan: Chronic Systolic Heart Failure: Unclear etiology. Mild LGE in the mid inferior and inferolateral walls consistent with infarct, but on review of coronary angiogram there is no significant disease (though poorly visualized anomalous circ). Given minimal LGE, room to titrate GDMT, patient deferred ICD to continue titration prior to ICD consideration. - NICM primarily, no signs of volume overload on exam although weight up in clinic today. -NYHA I-II symptoms today. Weight is up, though patient is asymptomatic. Patient does have winter clothes on today. Creatinine, BUN,  and K are up on BMET today. No contraction alkalosis noted. Appears relatively  euvolemic with no symptoms and weight at home is stable. - Continue furosemide  to 40 mg prn. Patient appropriately taking doses as needed for weight gain with signs of edema, or SOB.  - Continue metoprolol  succinate 100 mg daily. - Decrease Entresto  back to 24-26 mg twice daily. Educated patient to continue monitoring BP at home and on the signs of hypotension. Repeat BMET in 7 days.  - Continue spironolactone  25 mg daily. Suspect K will trend down with Entresto  dose reduction and resolution of AKI. - Continue Farxiga  10 mg daily.   Anemia - Most recent Hgb 13.7 on 04/18/2024 - Oncology arranging iron  infusions   Colon Cancer  - Pathology showed invasive adenocarcinoma - s/p hemicolectomy 12/18/23. 28 lymph nodes negative - Surgery was considered curative, but she has high risk features that place her at moderate risk for recurrence.  Follow up with BMET in 7 days and in 2 weeks with Dr. Zenaida Morna Breach, PharmD PGY2 Cardiology Pharmacy Resident  Please do not hesitate to reach out with questions or concerns,  Leah Hernandez, PharmD, CPP, BCPS, [facility] Pharmacist  Phone - [phone] 06/02/2024 3:54 PM

## 2024-06-03 NOTE — Progress Notes (Signed)
 Cardiac Individual Treatment Plan  Patient Details  Name: Leah Hernandez MRN: [id number] Date of Birth: 1960/11/30 Referring Provider:   Flowsheet Row CARDIAC REHAB PHASE II ORIENTATION from 05/15/2024 in [facility]  Referring Provider [name], MD    Initial Encounter Date:  Flowsheet Row CARDIAC REHAB PHASE II ORIENTATION from 05/15/2024 in [facility]  Date 05/15/24    Visit Diagnosis: Heart failure, chronic systolic (HCC)  Patient's Home Medications on Admission:  Current Outpatient Medications:    acetaminophen  (TYLENOL ) 325 MG tablet, Take 325-650 mg by mouth every 6 (six) hours as needed for mild pain (pain score 1-3) or moderate pain (pain score 4-6)., Disp: , Rfl:    FARXIGA  10 MG TABS tablet, Take 1 tablet (10 mg total) by mouth daily., Disp: 90 tablet, Rfl: 1   furosemide  (LASIX ) 40 MG tablet, Take 1 tablet (40 mg total) by mouth daily as needed for fluid or edema (Weight gain >3 lbs in a day or >5 lbs in one week)., Disp: 30 tablet, Rfl: 5   methocarbamol  (ROBAXIN ) 500 MG tablet, Take 500 mg by mouth 3 (three) times daily., Disp: , Rfl:    metoprolol  succinate (TOPROL -XL) 100 MG 24 hr tablet, Take 1 tablet (100 mg total) by mouth daily., Disp: 90 tablet, Rfl: 5   polyethylene glycol powder (GLYCOLAX /MIRALAX ) 17 GM/SCOOP powder, Take 17 g by mouth daily., Disp: 238 g, Rfl: 0   sacubitril -valsartan  (ENTRESTO ) 24-26 MG, Take 1 tablet by mouth 2 (two) times daily., Disp: 60 tablet, Rfl: 2   spironolactone  (ALDACTONE ) 25 MG tablet, Take 1 tablet (25 mg total) by mouth daily., Disp: 30 tablet, Rfl: 5  Past Medical History: Past Medical History:  Diagnosis Date   Prediabetes     Tobacco Use: Social History   Tobacco Use  Smoking Status Never  Smokeless Tobacco Never    Labs: Review Flowsheet       Latest Ref Rng & Units 03/27/2017 09/29/2019  12/16/2023 12/17/2023  Labs for ITP Cardiac and Pulmonary Rehab  Cholestrol 100 - 199 mg/dL - 789  - -  LDL (calc) 0 - 99 mg/dL - 81  - -  HDL-C >60 mg/dL - 884  - -  Trlycerides 0 - 149 mg/dL - 81  - -  Hemoglobin J8r 4.8 - 5.6 % 5.7  5.7  5.4  5.2   PH, Arterial 7.35 - 7.45 - - - 7.400   PCO2 arterial 32 - 48 mmHg - - - 34.2   Bicarbonate 20.0 - 28.0 mmol/L - - - 21.2  21.8  21.9   TCO2 22 - 32 mmol/L - - - 22  23  23    Acid-base deficit 0.0 - 2.0 mmol/L - - - 3.0  3.0  3.0   O2 Saturation % - - - 96  66  65     Details       Multiple values from one day are sorted in reverse-chronological order         Capillary Blood Glucose: Lab Results  Component Value Date   GLUCAP 90 05/21/2024   GLUCAP 91 05/21/2024   GLUCAP 84 12/17/2023   GLUCAP 85 12/14/2023     Exercise Target Goals: Exercise Program Goal: Individual exercise prescription set using results from initial 6 min walk test and THRR while considering  patient's activity barriers and safety.   Exercise Prescription Goal: Initial exercise  prescription builds to 30-45 minutes a day of aerobic activity, 2-3 days per week.  Home exercise guidelines will be given to patient during program as part of exercise prescription that the participant will acknowledge.  Activity Barriers & Risk Stratification:  Activity Barriers & Cardiac Risk Stratification - 05/15/24 1519       Activity Barriers & Cardiac Risk Stratification   Activity Barriers Balance Concerns;Joint Problems;Arthritis;Back Problems;Deconditioning;Decreased Ventricular Function    Cardiac Risk Stratification High          6 Minute Walk:  6 Minute Walk     Row Name 05/15/24 1159         6 Minute Walk   Phase Initial     Distance 1010 feet     Walk Time 6 minutes     # of Rest Breaks 0     MPH 1.91     METS 2.22     RPE 11     Perceived Dyspnea  0     VO2 Peak 7.8     Symptoms No     Resting HR 73 bpm     Resting BP 104/62     Resting  Oxygen Saturation  99 %     Exercise Oxygen Saturation  during 6 min walk 99 %     Max Ex. HR 88 bpm     Max Ex. BP 122/80     2 Minute Post BP 110/70        Oxygen Initial Assessment:   Oxygen Re-Evaluation:   Oxygen Discharge (Final Oxygen Re-Evaluation):   Initial Exercise Prescription:  Initial Exercise Prescription - 05/15/24 1500       Date of Initial Exercise RX and Referring Provider   Date 05/15/24    Referring Provider [name], MD    Expected Discharge Date 08/06/24      NuStep   Level 1    SPM 70    Minutes 15    METs 1.8      Track   Laps 10    Minutes 15    METs 2.5      Prescription Details   Frequency (times per week) 3    Duration Progress to 30 minutes of continuous aerobic without signs/symptoms of physical distress      Intensity   THRR 40-80% of Max Heartrate 63-126    Ratings of Perceived Exertion 11-13    Perceived Dyspnea 0-4      Progression   Progression Continue progressive overload as per policy without signs/symptoms or physical distress.      Resistance Training   Training Prescription Yes    Reps 10-15          Perform Capillary Blood Glucose checks as needed.  Exercise Prescription Changes:   Exercise Prescription Changes     Row Name 05/21/24 1021 06/02/24 1034           Response to Exercise   Blood Pressure (Admit) 112/78 98/60      Blood Pressure (Exercise) 116/60 --      Blood Pressure (Exit) 104/72 98/64      Heart Rate (Admit) 74 bpm 82 bpm      Heart Rate (Exercise) 111 bpm 116 bpm      Heart Rate (Exit) 83 bpm 86 bpm      Rating of Perceived Exertion (Exercise) 11 11      Symptoms None None      Comments Off to a good start with exercise. --  Duration Continue with 30 min of aerobic exercise without signs/symptoms of physical distress. Continue with 30 min of aerobic exercise without signs/symptoms of physical distress.      Intensity THRR unchanged THRR unchanged        Progression    Progression Continue to progress workloads to maintain intensity without signs/symptoms of physical distress. Continue to progress workloads to maintain intensity without signs/symptoms of physical distress.      Average METs 2.7 2.5        Resistance Training   Training Prescription No Yes      Weight Relaxation day, no weights 2 lbs      Reps -- 10-15      Time -- 5 Minutes        Interval Training   Interval Training No No        NuStep   Level 1 1      SPM 54 74      Minutes 15 15      METs 1.7 1.9        Track   Laps 22 16      Minutes 15 15      METs 3.81 3.04         Exercise Comments:   Exercise Comments     Row Name 05/21/24 1128           Exercise Comments Leah Hernandez tolerated low intensity exercise well without symptoms. Oriented to the exercise equipment and stretching routine.          Exercise Goals and Review:   Exercise Goals     Row Name 05/15/24 1521             Exercise Goals   Increase Physical Activity Yes       Intervention Provide advice, education, support and counseling about physical activity/exercise needs.;Develop an individualized exercise prescription for aerobic and resistive training based on initial evaluation findings, risk stratification, comorbidities and participant's personal goals.       Expected Outcomes Short Term: Attend rehab on a regular basis to increase amount of physical activity.;Long Term: Exercising regularly at least 3-5 days a week.;Long Term: Add in home exercise to make exercise part of routine and to increase amount of physical activity.       Increase Strength and Stamina Yes       Intervention Provide advice, education, support and counseling about physical activity/exercise needs.;Develop an individualized exercise prescription for aerobic and resistive training based on initial evaluation findings, risk stratification, comorbidities and participant's personal goals.       Expected Outcomes Short Term:  Increase workloads from initial exercise prescription for resistance, speed, and METs.;Short Term: Perform resistance training exercises routinely during rehab and add in resistance training at home;Long Term: Improve cardiorespiratory fitness, muscular endurance and strength as measured by increased METs and functional capacity ( )       Able to understand and use rate of perceived exertion (RPE) scale Yes       Intervention Provide education and explanation on how to use RPE scale       Expected Outcomes Short Term: Able to use RPE daily in rehab to express subjective intensity level;Long Term:  Able to use RPE to guide intensity level when exercising independently       Knowledge and understanding of Target Heart Rate Range (THRR) Yes       Intervention Provide education and explanation of THRR including how the numbers were predicted and where they are located  for reference       Expected Outcomes Short Term: Able to state/look up THRR;Long Term: Able to use THRR to govern intensity when exercising independently;Short Term: Able to use daily as guideline for intensity in rehab       Understanding of Exercise Prescription Yes       Intervention Provide education, explanation, and written materials on patient's individual exercise prescription       Expected Outcomes Short Term: Able to explain program exercise prescription;Long Term: Able to explain home exercise prescription to exercise independently          Exercise Goals Re-Evaluation :  Exercise Goals Re-Evaluation     Row Name 05/21/24 1128             Exercise Goal Re-Evaluation   Exercise Goals Review Increase Physical Activity;Increase Strength and Stamina;Able to understand and use rate of perceived exertion (RPE) scale       Comments Leah Hernandez is able to understand and use RPE scale appropriately.       Expected Outcomes Progress workloads as tolerated to help improve cardiorespiratory fitness.          Discharge Exercise  Prescription (Final Exercise Prescription Changes):  Exercise Prescription Changes - 06/02/24 1034       Response to Exercise   Blood Pressure (Admit) 98/60    Blood Pressure (Exit) 98/64    Heart Rate (Admit) 82 bpm    Heart Rate (Exercise) 116 bpm    Heart Rate (Exit) 86 bpm    Rating of Perceived Exertion (Exercise) 11    Symptoms None    Duration Continue with 30 min of aerobic exercise without signs/symptoms of physical distress.    Intensity THRR unchanged      Progression   Progression Continue to progress workloads to maintain intensity without signs/symptoms of physical distress.    Average METs 2.5      Resistance Training   Training Prescription Yes    Weight 2 lbs    Reps 10-15    Time 5 Minutes      Interval Training   Interval Training No      NuStep   Level 1    SPM 74    Minutes 15    METs 1.9      Track   Laps 16    Minutes 15    METs 3.04          Nutrition:  Target Goals: Understanding of nutrition guidelines, daily intake of sodium 1500mg , cholesterol 200mg , calories 30% from fat and 7% or less from saturated fats, daily to have 5 or more servings of fruits and vegetables.  Biometrics:  Pre Biometrics - 05/15/24 1100       Pre Biometrics   Waist Circumference 41 inches    Hip Circumference 50 inches    Waist to Hip Ratio 0.82 %    Triceps Skinfold 31 mm    % Body Fat 44.2 %    Grip Strength 32 kg    Flexibility 16 in    Single Leg Stand 10 seconds           Nutrition Therapy Plan and Nutrition Goals:   Nutrition Assessments:  MEDIFICTS Score Key: >=70 Need to make dietary changes  40-70 Heart Healthy Diet <= 40 Therapeutic Level Cholesterol Diet    Picture Your Plate Scores: <59 Unhealthy dietary pattern with much room for improvement. 41-50 Dietary pattern unlikely to meet recommendations for good health and room for improvement. 51-60  More healthful dietary pattern, with some room for improvement.  >60 Healthy  dietary pattern, although there may be some specific behaviors that could be improved.    Nutrition Goals Re-Evaluation:   Nutrition Goals Re-Evaluation:   Nutrition Goals Discharge (Final Nutrition Goals Re-Evaluation):   Psychosocial: Target Goals: Acknowledge presence or absence of significant depression and/or stress, maximize coping skills, provide positive support system. Participant is able to verbalize types and ability to use techniques and skills needed for reducing stress and depression.  Initial Review & Psychosocial Screening:  Initial Psych Review & Screening - 05/15/24 1552       Initial Review   Current issues with None Identified      Family Dynamics   Good Support System? Yes   Kids and husband     Screening Interventions   Interventions Encouraged to exercise;To provide support and resources with identified psychosocial needs;Provide feedback about the scores to participant    Expected Outcomes Long Term Goal: Stressors or current issues are controlled or eliminated.;Short Term goal: Identification and review with participant of any Quality of Life or Depression concerns found by scoring the questionnaire.;Long Term goal: The participant improves quality of Life and PHQ9 Scores as seen by post scores and/or verbalization of changes          Quality of Life Scores:  Quality of Life - 05/15/24 1504       Quality of Life   Select Quality of Life      Quality of Life Scores   Health/Function Pre 26 %    Socioeconomic Pre 25.06 %    Psych/Spiritual Pre 29.58 %    Family Pre 28.8 %    GLOBAL Pre 27.09 %         Scores of 19 and below usually indicate a poorer quality of life in these areas.  A difference of  2-3 points is a clinically meaningful difference.  A difference of 2-3 points in the total score of the Quality of Life Index has been associated with significant improvement in overall quality of life, self-image, physical symptoms, and general  health in studies assessing change in quality of life.  PHQ-9: Review Flowsheet  More data exists      05/15/2024 12/26/2023 09/29/2019 04/30/2017 04/23/2017  Depression screen PHQ 2/9  Decreased Interest 0 0 0 0 0  Down, Depressed, Hopeless 0 0 0 0 0  PHQ - 2 Score 0 0 0 0 0  Altered sleeping 0 - - - -  Tired, decreased energy 0 - - - -  Change in appetite 0 - - - -  Feeling bad or failure about yourself  0 - - - -  Trouble concentrating 0 - - - -  Moving slowly or fidgety/restless 0 - - - -  Suicidal thoughts 0 - - - -  PHQ-9 Score 0 - - - -   Interpretation of Total Score  Total Score Depression Severity:  1-4 = Minimal depression, 5-9 = Mild depression, 10-14 = Moderate depression, 15-19 = Moderately severe depression, 20-27 = Severe depression   Psychosocial Evaluation and Intervention:   Psychosocial Re-Evaluation:  Psychosocial Re-Evaluation     Row Name 05/21/24 1636 06/02/24 1800           Psychosocial Re-Evaluation   Current issues with None Identified None Identified      Interventions Encouraged to attend Cardiac Rehabilitation for the exercise Encouraged to attend Cardiac Rehabilitation for the exercise      Continue Psychosocial  Services  No Follow up required No Follow up required         Psychosocial Discharge (Final Psychosocial Re-Evaluation):  Psychosocial Re-Evaluation - 06/02/24 1800       Psychosocial Re-Evaluation   Current issues with None Identified    Interventions Encouraged to attend Cardiac Rehabilitation for the exercise    Continue Psychosocial Services  No Follow up required          Vocational Rehabilitation: Provide vocational rehab assistance to qualifying candidates.   Vocational Rehab Evaluation & Intervention:  Vocational Rehab - 05/15/24 1505       Initial Vocational Rehab Evaluation & Intervention   Assessment shows need for Vocational Rehabilitation No   Pt is on Disability         Education: Education Goals:  Education classes will be provided on a weekly basis, covering required topics. Participant will state understanding/return demonstration of topics presented.    Education     Row Name 05/21/24 1100     Education   Cardiac Education Topics Pritikin   Customer Service Manager   Weekly Topic Adding Flavor - Sodium-Free   Instruction Review Code 1- Verbalizes Understanding   Class Start Time 1145   Class Stop Time 1223   Class Time Calculation (min) 38 min      Core Videos: Exercise    Move It!  Clinical staff conducted group or individual video education with verbal and written material and guidebook.  Patient learns the recommended Pritikin exercise program. Exercise with the goal of living a long, healthy life. Some of the health benefits of exercise include controlled diabetes, healthier blood pressure levels, improved cholesterol levels, improved heart and lung capacity, improved sleep, and better body composition. Everyone should speak with their doctor before starting or changing an exercise routine.  Biomechanical Limitations Clinical staff conducted group or individual video education with verbal and written material and guidebook.  Patient learns how biomechanical limitations can impact exercise and how we can mitigate and possibly overcome limitations to have an impactful and balanced exercise routine.  Body Composition Clinical staff conducted group or individual video education with verbal and written material and guidebook.  Patient learns that body composition (ratio of muscle mass to fat mass) is a key component to assessing overall fitness, rather than body weight alone. Increased fat mass, especially visceral belly fat, can put us  at increased risk for metabolic syndrome, type 2 diabetes, heart disease, and even death. It is recommended to combine diet and exercise (cardiovascular and resistance training) to improve your body  composition. Seek guidance from your physician and exercise physiologist before implementing an exercise routine.  Exercise Action Plan Clinical staff conducted group or individual video education with verbal and written material and guidebook.  Patient learns the recommended strategies to achieve and enjoy long-term exercise adherence, including variety, self-motivation, self-efficacy, and positive decision making. Benefits of exercise include fitness, good health, weight management, more energy, better sleep, less stress, and overall well-being.  Medical   Heart Disease Risk Reduction Clinical staff conducted group or individual video education with verbal and written material and guidebook.  Patient learns our heart is our most vital organ as it circulates oxygen, nutrients, white blood cells, and hormones throughout the entire body, and carries waste away. Data supports a plant-based eating plan like the Pritikin Program for its effectiveness in slowing progression of and reversing heart disease. The video provides a number of recommendations to  address heart disease.   Metabolic Syndrome and Belly Fat  Clinical staff conducted group or individual video education with verbal and written material and guidebook.  Patient learns what metabolic syndrome is, how it leads to heart disease, and how one can reverse it and keep it from coming back. You have metabolic syndrome if you have 3 of the following 5 criteria: abdominal obesity, high blood pressure, high triglycerides, low HDL cholesterol, and high blood sugar.  Hypertension and Heart Disease Clinical staff conducted group or individual video education with verbal and written material and guidebook.  Patient learns that high blood pressure, or hypertension, is very common in the [place] . Hypertension is largely due to excessive salt intake, but other important risk factors include being overweight, physical inactivity, drinking too much  alcohol, smoking, and not eating enough potassium from fruits and vegetables. High blood pressure is a leading risk factor for heart attack, stroke, congestive heart failure, dementia, kidney failure, and premature death. Long-term effects of excessive salt intake include stiffening of the arteries and thickening of heart muscle and organ damage. Recommendations include ways to reduce hypertension and the risk of heart disease.  Diseases of Our Time - Focusing on Diabetes Clinical staff conducted group or individual video education with verbal and written material and guidebook.  Patient learns why the best way to stop diseases of our time is prevention, through food and other lifestyle changes. Medicine (such as prescription pills and surgeries) is often only a Band-Aid on the problem, not a long-term solution. Most common diseases of our time include obesity, type 2 diabetes, hypertension, heart disease, and cancer. The Pritikin Program is recommended and has been proven to help reduce, reverse, and/or prevent the damaging effects of metabolic syndrome.  Nutrition   Overview of the Pritikin Eating Plan  Clinical staff conducted group or individual video education with verbal and written material and guidebook.  Patient learns about the Pritikin Eating Plan for disease risk reduction. The Pritikin Eating Plan emphasizes a wide variety of unrefined, minimally-processed carbohydrates, like fruits, vegetables, whole grains, and legumes. Go, Caution, and Stop food choices are explained. Plant-based and lean animal proteins are emphasized. Rationale provided for low sodium intake for blood pressure control, low added sugars for blood sugar stabilization, and low added fats and oils for coronary artery disease risk reduction and weight management.  Calorie Density  Clinical staff conducted group or individual video education with verbal and written material and guidebook.  Patient learns about calorie  density and how it impacts the Pritikin Eating Plan. Knowing the characteristics of the food you choose will help you decide whether those foods will lead to weight gain or weight loss, and whether you want to consume more or less of them. Weight loss is usually a side effect of the Pritikin Eating Plan because of its focus on low calorie-dense foods.  Label Reading  Clinical staff conducted group or individual video education with verbal and written material and guidebook.  Patient learns about the Pritikin recommended label reading guidelines and corresponding recommendations regarding calorie density, added sugars, sodium content, and whole grains.  Dining Out - Part 1  Clinical staff conducted group or individual video education with verbal and written material and guidebook.  Patient learns that restaurant meals can be sabotaging because they can be so high in calories, fat, sodium, and/or sugar. Patient learns recommended strategies on how to positively address this and avoid unhealthy pitfalls.  Facts on Fats  Clinical staff conducted group  or individual video education with verbal and written material and guidebook.  Patient learns that lifestyle modifications can be just as effective, if not more so, as many medications for lowering your risk of heart disease. A Pritikin lifestyle can help to reduce your risk of inflammation and atherosclerosis (cholesterol build-up, or plaque, in the artery walls). Lifestyle interventions such as dietary choices and physical activity address the cause of atherosclerosis. A review of the types of fats and their impact on blood cholesterol levels, along with dietary recommendations to reduce fat intake is also included.  Nutrition Action Plan  Clinical staff conducted group or individual video education with verbal and written material and guidebook.  Patient learns how to incorporate Pritikin recommendations into their lifestyle. Recommendations include  planning and keeping personal health goals in mind as an important part of their success.  Healthy Mind-Set    Healthy Minds, Bodies, Hearts  Clinical staff conducted group or individual video education with verbal and written material and guidebook.  Patient learns how to identify when they are stressed. Video will discuss the impact of that stress, as well as the many benefits of stress management. Patient will also be introduced to stress management techniques. The way we think, act, and feel has an impact on our hearts.  How Our Thoughts Can Heal Our Hearts  Clinical staff conducted group or individual video education with verbal and written material and guidebook.  Patient learns that negative thoughts can cause depression and anxiety. This can result in negative lifestyle behavior and serious health problems. Cognitive behavioral therapy is an effective method to help control our thoughts in order to change and improve our emotional outlook.  Additional Videos:  Exercise    Improving Performance  Clinical staff conducted group or individual video education with verbal and written material and guidebook.  Patient learns to use a non-linear approach by alternating intensity levels and lengths of time spent exercising to help burn more calories and lose more body fat. Cardiovascular exercise helps improve heart health, metabolism, hormonal balance, blood sugar control, and recovery from fatigue. Resistance training improves strength, endurance, balance, coordination, reaction time, metabolism, and muscle mass. Flexibility exercise improves circulation, posture, and balance. Seek guidance from your physician and exercise physiologist before implementing an exercise routine and learn your capabilities and proper form for all exercise.  Introduction to Yoga  Clinical staff conducted group or individual video education with verbal and written material and guidebook.  Patient learns about yoga, a  discipline of the coming together of mind, breath, and body. The benefits of yoga include improved flexibility, improved range of motion, better posture and core strength, increased lung function, weight loss, and positive self-image. Yoga's heart health benefits include lowered blood pressure, healthier heart rate, decreased cholesterol and triglyceride levels, improved immune function, and reduced stress. Seek guidance from your physician and exercise physiologist before implementing an exercise routine and learn your capabilities and proper form for all exercise.  Medical   Aging: Enhancing Your Quality of Life  Clinical staff conducted group or individual video education with verbal and written material and guidebook.  Patient learns key strategies and recommendations to stay in good physical health and enhance quality of life, such as prevention strategies, having an advocate, securing a Health Care Proxy and Power of Attorney, and keeping a list of medications and system for tracking them. It also discusses how to avoid risk for bone loss.  Biology of Weight Control  Clinical staff conducted group or individual video  education with verbal and written material and guidebook.  Patient learns that weight gain occurs because we consume more calories than we burn (eating more, moving less). Even if your body weight is normal, you may have higher ratios of fat compared to muscle mass. Too much body fat puts you at increased risk for cardiovascular disease, heart attack, stroke, type 2 diabetes, and obesity-related cancers. In addition to exercise, following the Pritikin Eating Plan can help reduce your risk.  Decoding Lab Results  Clinical staff conducted group or individual video education with verbal and written material and guidebook.  Patient learns that lab test reflects one measurement whose values change over time and are influenced by many factors, including medication, stress, sleep, exercise,  food, hydration, pre-existing medical conditions, and more. It is recommended to use the knowledge from this video to become more involved with your lab results and evaluate your numbers to speak with your doctor.   Diseases of Our Time - Overview  Clinical staff conducted group or individual video education with verbal and written material and guidebook.  Patient learns that according to the CDC, 50% to 70% of chronic diseases (such as obesity, type 2 diabetes, elevated lipids, hypertension, and heart disease) are avoidable through lifestyle improvements including healthier food choices, listening to satiety cues, and increased physical activity.  Sleep Disorders Clinical staff conducted group or individual video education with verbal and written material and guidebook.  Patient learns how good quality and duration of sleep are important to overall health and well-being. Patient also learns about sleep disorders and how they impact health along with recommendations to address them, including discussing with a physician.  Nutrition  Dining Out - Part 2 Clinical staff conducted group or individual video education with verbal and written material and guidebook.  Patient learns how to plan ahead and communicate in order to maximize their dining experience in a healthy and nutritious manner. Included are recommended food choices based on the type of restaurant the patient is visiting.   Fueling a Banker conducted group or individual video education with verbal and written material and guidebook.  There is a strong connection between our food choices and our health. Diseases like obesity and type 2 diabetes are very prevalent and are in large-part due to lifestyle choices. The Pritikin Eating Plan provides plenty of food and hunger-curbing satisfaction. It is easy to follow, affordable, and helps reduce health risks.  Menu Workshop  Clinical staff conducted group or individual  video education with verbal and written material and guidebook.  Patient learns that restaurant meals can sabotage health goals because they are often packed with calories, fat, sodium, and sugar. Recommendations include strategies to plan ahead and to communicate with the manager, chef, or server to help order a healthier meal.  Planning Your Eating Strategy  Clinical staff conducted group or individual video education with verbal and written material and guidebook.  Patient learns about the Pritikin Eating Plan and its benefit of reducing the risk of disease. The Pritikin Eating Plan does not focus on calories. Instead, it emphasizes high-quality, nutrient-rich foods. By knowing the characteristics of the foods, we choose, we can determine their calorie density and make informed decisions.  Targeting Your Nutrition Priorities  Clinical staff conducted group or individual video education with verbal and written material and guidebook.  Patient learns that lifestyle habits have a tremendous impact on disease risk and progression. This video provides eating and physical activity recommendations based on your  personal health goals, such as reducing LDL cholesterol, losing weight, preventing or controlling type 2 diabetes, and reducing high blood pressure.  Vitamins and Minerals  Clinical staff conducted group or individual video education with verbal and written material and guidebook.  Patient learns different ways to obtain key vitamins and minerals, including through a recommended healthy diet. It is important to discuss all supplements you take with your doctor.   Healthy Mind-Set    Smoking Cessation  Clinical staff conducted group or individual video education with verbal and written material and guidebook.  Patient learns that cigarette smoking and tobacco addiction pose a serious health risk which affects millions of people. Stopping smoking will significantly reduce the risk of heart  disease, lung disease, and many forms of cancer. Recommended strategies for quitting are covered, including working with your doctor to develop a successful plan.  Culinary   Becoming a Set Designer conducted group or individual video education with verbal and written material and guidebook.  Patient learns that cooking at home can be healthy, cost-effective, quick, and puts them in control. Keys to cooking healthy recipes will include looking at your recipe, assessing your equipment needs, planning ahead, making it simple, choosing cost-effective seasonal ingredients, and limiting the use of added fats, salts, and sugars.  Cooking - Breakfast and Snacks  Clinical staff conducted group or individual video education with verbal and written material and guidebook.  Patient learns how important breakfast is to satiety and nutrition through the entire day. Recommendations include key foods to eat during breakfast to help stabilize blood sugar levels and to prevent overeating at meals later in the day. Planning ahead is also a key component.  Cooking - Educational Psychologist conducted group or individual video education with verbal and written material and guidebook.  Patient learns eating strategies to improve overall health, including an approach to cook more at home. Recommendations include thinking of animal protein as a side on your plate rather than center stage and focusing instead on lower calorie dense options like vegetables, fruits, whole grains, and plant-based proteins, such as beans. Making sauces in large quantities to freeze for later and leaving the skin on your vegetables are also recommended to maximize your experience.  Cooking - Healthy Salads and Dressing Clinical staff conducted group or individual video education with verbal and written material and guidebook.  Patient learns that vegetables, fruits, whole grains, and legumes are the foundations of the  Pritikin Eating Plan. Recommendations include how to incorporate each of these in flavorful and healthy salads, and how to create homemade salad dressings. Proper handling of ingredients is also covered. Cooking - Soups and [employer] - Soups and Desserts Clinical staff conducted group or individual video education with verbal and written material and guidebook.  Patient learns that Pritikin soups and desserts make for easy, nutritious, and delicious snacks and meal components that are low in sodium, fat, sugar, and calorie density, while high in vitamins, minerals, and filling fiber. Recommendations include simple and healthy ideas for soups and desserts.   Overview     The Pritikin Solution Program Overview Clinical staff conducted group or individual video education with verbal and written material and guidebook.  Patient learns that the results of the Pritikin Program have been documented in more than 100 articles published in peer-reviewed journals, and the benefits include reducing risk factors for (and, in some cases, even reversing) high cholesterol, high blood pressure, type 2 diabetes, obesity,  and more! An overview of the three key pillars of the Pritikin Program will be covered: eating well, doing regular exercise, and having a healthy mind-set.  WORKSHOPS  Exercise: Exercise Basics: Building Your Action Plan Clinical staff led group instruction and group discussion with PowerPoint presentation and patient guidebook. To enhance the learning environment the use of posters, models and videos may be added. At the conclusion of this workshop, patients will comprehend the difference between physical activity and exercise, as well as the benefits of incorporating both, into their routine. Patients will understand the FITT (Frequency, Intensity, Time, and Type) principle and how to use it to build an exercise action plan. In addition, safety concerns and other considerations for  exercise and cardiac rehab will be addressed by the presenter. The purpose of this lesson is to promote a comprehensive and effective weekly exercise routine in order to improve patients' overall level of fitness.   Managing Heart Disease: Your Path to a Healthier Heart Clinical staff led group instruction and group discussion with PowerPoint presentation and patient guidebook. To enhance the learning environment the use of posters, models and videos may be added.At the conclusion of this workshop, patients will understand the anatomy and physiology of the heart. Additionally, they will understand how Pritikin's three pillars impact the risk factors, the progression, and the management of heart disease.  The purpose of this lesson is to provide a high-level overview of the heart, heart disease, and how the Pritikin lifestyle positively impacts risk factors.  Exercise Biomechanics Clinical staff led group instruction and group discussion with PowerPoint presentation and patient guidebook. To enhance the learning environment the use of posters, models and videos may be added. Patients will learn how the structural parts of their bodies function and how these functions impact their daily activities, movement, and exercise. Patients will learn how to promote a neutral spine, learn how to manage pain, and identify ways to improve their physical movement in order to promote healthy living. The purpose of this lesson is to expose patients to common physical limitations that impact physical activity. Participants will learn practical ways to adapt and manage aches and pains, and to minimize their effect on regular exercise. Patients will learn how to maintain good posture while sitting, walking, and lifting.  Balance Training and Fall Prevention  Clinical staff led group instruction and group discussion with PowerPoint presentation and patient guidebook. To enhance the learning environment the use of  posters, models and videos may be added. At the conclusion of this workshop, patients will understand the importance of their sensorimotor skills (vision, proprioception, and the vestibular system) in maintaining their ability to balance as they age. Patients will apply a variety of balancing exercises that are appropriate for their current level of function. Patients will understand the common causes for poor balance, possible solutions to these problems, and ways to modify their physical environment in order to minimize their fall risk. The purpose of this lesson is to teach patients about the importance of maintaining balance as they age and ways to minimize their risk of falling.  WORKSHOPS   Nutrition:  Fueling a Ship Broker led group instruction and group discussion with PowerPoint presentation and patient guidebook. To enhance the learning environment the use of posters, models and videos may be added. Patients will review the foundational principles of the Pritikin Eating Plan and understand what constitutes a serving size in each of the food groups. Patients will also learn Pritikin-friendly foods that are better  choices when away from home and review make-ahead meal and snack options. Calorie density will be reviewed and applied to three nutrition priorities: weight maintenance, weight loss, and weight gain. The purpose of this lesson is to reinforce (in a group setting) the key concepts around what patients are recommended to eat and how to apply these guidelines when away from home by planning and selecting Pritikin-friendly options. Patients will understand how calorie density may be adjusted for different weight management goals.  Mindful Eating  Clinical staff led group instruction and group discussion with PowerPoint presentation and patient guidebook. To enhance the learning environment the use of posters, models and videos may be added. Patients will briefly review the  concepts of the Pritikin Eating Plan and the importance of low-calorie dense foods. The concept of mindful eating will be introduced as well as the importance of paying attention to internal hunger signals. Triggers for non-hunger eating and techniques for dealing with triggers will be explored. The purpose of this lesson is to provide patients with the opportunity to review the basic principles of the Pritikin Eating Plan, discuss the value of eating mindfully and how to measure internal cues of hunger and fullness using the Hunger Scale. Patients will also discuss reasons for non-hunger eating and learn strategies to use for controlling emotional eating.  Targeting Your Nutrition Priorities Clinical staff led group instruction and group discussion with PowerPoint presentation and patient guidebook. To enhance the learning environment the use of posters, models and videos may be added. Patients will learn how to determine their genetic susceptibility to disease by reviewing their family history. Patients will gain insight into the importance of diet as part of an overall healthy lifestyle in mitigating the impact of genetics and other environmental insults. The purpose of this lesson is to provide patients with the opportunity to assess their personal nutrition priorities by looking at their family history, their own health history and current risk factors. Patients will also be able to discuss ways of prioritizing and modifying the Pritikin Eating Plan for their highest risk areas  Menu  Clinical staff led group instruction and group discussion with PowerPoint presentation and patient guidebook. To enhance the learning environment the use of posters, models and videos may be added. Using menus brought in from [employer], or printed from [employer], patients will apply the Pritikin dining out guidelines that were presented in the [employer] video. Patients will also be able to  practice these guidelines in a variety of provided scenarios. The purpose of this lesson is to provide patients with the opportunity to practice hands-on learning of the Pritikin Dining Out guidelines with actual menus and practice scenarios.  Label Reading Clinical staff led group instruction and group discussion with PowerPoint presentation and patient guidebook. To enhance the learning environment the use of posters, models and videos may be added. Patients will review and discuss the Pritikin label reading guidelines presented in Pritikin's Label Reading Educational series video. Using fool labels brought in from local grocery stores and markets, patients will apply the label reading guidelines and determine if the packaged food meet the Pritikin guidelines. The purpose of this lesson is to provide patients with the opportunity to review, discuss, and practice hands-on learning of the Pritikin Label Reading guidelines with actual packaged food labels. Cooking School  Pritikin's [employer] are designed to teach patients ways to prepare quick, simple, and affordable recipes at home. The importance of nutrition's role in chronic disease risk  reduction is reflected in its emphasis in the overall Pritikin program. By learning how to prepare essential core Pritikin Eating Plan recipes, patients will increase control over what they eat; be able to customize the flavor of foods without the use of added salt, sugar, or fat; and improve the quality of the food they consume. By learning a set of core recipes which are easily assembled, quickly prepared, and affordable, patients are more likely to prepare more healthy foods at home. These workshops focus on convenient breakfasts, simple entres, side dishes, and desserts which can be prepared with minimal effort and are consistent with nutrition recommendations for cardiovascular risk reduction. Cooking [employer] are taught by a interior and spatial designer (RD) who has been trained by the [employer]. The chef or RD has a clear understanding of the importance of minimizing - if not completely eliminating - added fat, sugar, and sodium in recipes. Throughout the series of Cooking School Workshop sessions, patients will learn about healthy ingredients and efficient methods of cooking to build confidence in their capability to prepare    Cooking School weekly topics:  Adding Flavor- Sodium-Free  Fast and Healthy Breakfasts  Powerhouse Plant-Based Proteins  Satisfying Salads and Dressings  Simple Sides and Sauces  International Cuisine-Spotlight on the [employer] Zones  Delicious Desserts  Savory Soups  [employer] - Meals in a Astronomer Appetizers and Snacks  Comforting Weekend Breakfasts  One-Pot Wonders   Fast Evening Meals  Landscape Architect Your Pritikin Plate  WORKSHOPS   Healthy Mindset (Psychosocial):  Focused Goals, Sustainable Changes Clinical staff led group instruction and group discussion with PowerPoint presentation and patient guidebook. To enhance the learning environment the use of posters, models and videos may be added. Patients will be able to apply effective goal setting strategies to establish at least one personal goal, and then take consistent, meaningful action toward that goal. They will learn to identify common barriers to achieving personal goals and develop strategies to overcome them. Patients will also gain an understanding of how our mind-set can impact our ability to achieve goals and the importance of cultivating a positive and growth-oriented mind-set. The purpose of this lesson is to provide patients with a deeper understanding of how to set and achieve personal goals, as well as the tools and strategies needed to overcome common obstacles which may arise along the way.  From Head to Heart: The Power of a Healthy Outlook  Clinical staff led group instruction and  group discussion with PowerPoint presentation and patient guidebook. To enhance the learning environment the use of posters, models and videos may be added. Patients will be able to recognize and describe the impact of emotions and mood on physical health. They will discover the importance of self-care and explore self-care practices which may work for them. Patients will also learn how to utilize the 4 C's to cultivate a healthier outlook and better manage stress and challenges. The purpose of this lesson is to demonstrate to patients how a healthy outlook is an essential part of maintaining good health, especially as they continue their cardiac rehab journey.  Healthy Sleep for a Healthy Heart Clinical staff led group instruction and group discussion with PowerPoint presentation and patient guidebook. To enhance the learning environment the use of posters, models and videos may be added. At the conclusion of this workshop, patients will be able to demonstrate knowledge of the importance of sleep to overall health, well-being, and quality of  life. They will understand the symptoms of, and treatments for, common sleep disorders. Patients will also be able to identify daytime and nighttime behaviors which impact sleep, and they will be able to apply these tools to help manage sleep-related challenges. The purpose of this lesson is to provide patients with a general overview of sleep and outline the importance of quality sleep. Patients will learn about a few of the most common sleep disorders. Patients will also be introduced to the concept of "sleep hygiene," and discover ways to self-manage certain sleeping problems through simple daily behavior changes. Finally, the workshop will motivate patients by clarifying the links between quality sleep and their goals of heart-healthy living.   Recognizing and Reducing Stress Clinical staff led group instruction and group discussion with PowerPoint presentation and  patient guidebook. To enhance the learning environment the use of posters, models and videos may be added. At the conclusion of this workshop, patients will be able to understand the types of stress reactions, differentiate between acute and chronic stress, and recognize the impact that chronic stress has on their health. They will also be able to apply different coping mechanisms, such as reframing negative self-talk. Patients will have the opportunity to practice a variety of stress management techniques, such as deep abdominal breathing, progressive muscle relaxation, and/or guided imagery.  The purpose of this lesson is to educate patients on the role of stress in their lives and to provide healthy techniques for coping with it.  Learning Barriers/Preferences:  Learning Barriers/Preferences - 05/15/24 1506       Learning Barriers/Preferences   Learning Barriers None    Learning Preferences Audio;Computer/Internet;Group Instruction;Individual Instruction;Skilled Demonstration;Pictoral;Verbal Instruction;Video;Written Material          Education Topics:  Knowledge Questionnaire Score:  Knowledge Questionnaire Score - 05/15/24 1505       Knowledge Questionnaire Score   Pre Score 21/24          Core Components/Risk Factors/Patient Goals at Admission:  Personal Goals and Risk Factors at Admission - 05/15/24 1547       Core Components/Risk Factors/Patient Goals on Admission    Weight Management Yes;Weight Loss    Intervention Weight Management: Develop a combined nutrition and exercise program designed to reach desired caloric intake, while maintaining appropriate intake of nutrient and fiber, sodium and fats, and appropriate energy expenditure required for the weight goal.;Weight Management: Provide education and appropriate resources to help participant work on and attain dietary goals.    Expected Outcomes Short Term: Continue to assess and modify interventions until short term  weight is achieved;Long Term: Adherence to nutrition and physical activity/exercise program aimed toward attainment of established weight goal;Weight Loss: Understanding of general recommendations for a balanced deficit meal plan, which promotes 1-2 lb weight loss per week and includes a negative energy balance of [phone] kcal/d;Understanding recommendations for meals to include 15-35% energy as protein, 25-35% energy from fat, 35-60% energy from carbohydrates, less than 200mg  of dietary cholesterol, 20-35 gm of total fiber daily;Understanding of distribution of calorie intake throughout the day with the consumption of 4-5 meals/snacks    Heart Failure Yes    Intervention Provide a combined exercise and nutrition program that is supplemented with education, support and counseling about heart failure. Directed toward relieving symptoms such as shortness of breath, decreased exercise tolerance, and extremity edema.    Expected Outcomes Improve functional capacity of life;Short term: Attendance in program 2-3 days a week with increased exercise capacity. Reported lower sodium intake. Reported increased fruit  and vegetable intake. Reports medication compliance.;Short term: Daily weights obtained and reported for increase. Utilizing diuretic protocols set by physician.;Long term: Adoption of self-care skills and reduction of barriers for early signs and symptoms recognition and intervention leading to self-care maintenance.    Hypertension Yes    Intervention Provide education on lifestyle modifcations including regular physical activity/exercise, weight management, moderate sodium restriction and increased consumption of fresh fruit, vegetables, and low fat dairy, alcohol moderation, and smoking cessation.;Monitor prescription use compliance.    Expected Outcomes Short Term: Continued assessment and intervention until BP is < 140/41mm HG in hypertensive participants. < 130/8mm HG in hypertensive participants  with diabetes, heart failure or chronic kidney disease.;Long Term: Maintenance of blood pressure at goal levels.    Lipids Yes    Intervention Provide education and support for participant on nutrition & aerobic/resistive exercise along with prescribed medications to achieve LDL 70mg , HDL >40mg .    Expected Outcomes Short Term: Participant states understanding of desired cholesterol values and is compliant with medications prescribed. Participant is following exercise prescription and nutrition guidelines.;Long Term: Cholesterol controlled with medications as prescribed, with individualized exercise RX and with personalized nutrition plan. Value goals: LDL < 70mg , HDL > 40 mg.    Personal Goal Other Yes    Personal Goal ST and long term: stabilize weight (lower fluid), eat healthier, increase flexibility    Intervention Will continue to monitor pt and progress workloads as tolerated without sign or symptom    Expected Outcomes Pt will achieve her goals          Core Components/Risk Factors/Patient Goals Review:   Goals and Risk Factor Review     Row Name 05/21/24 1637 06/02/24 1803           Core Components/Risk Factors/Patient Goals Review   Personal Goals Review Weight Management/Obesity;Heart Failure;Hypertension;Lipids Weight Management/Obesity;Heart Failure;Hypertension;Lipids      Review Leah Hernandez started cardiac rehab on 05/21/24. Leah Hernandez did well with excise. Vital signs were stable. Spot checked CBG's WNL. Leah Hernandez does not check her CBG's at home. Leah Hernandez started cardiac rehab on 05/21/24. Leah Hernandez is off to a good start with excise. Vital signs have been  stable. Leah Hernandez's weight is up 3.2 kg since starting the program. the pharmacist at the heart failure clinic is aware of weight gain. Medications have been adjusted.      Expected Outcomes Leah Hernandez will continue to paritcipate on cardiac rehab for exercise, nutrition and lifestyle modifications Leah Hernandez will continue to paritcipate on  cardiac rehab for exercise, nutrition and lifestyle modifications         Core Components/Risk Factors/Patient Goals at Discharge (Final Review):   Goals and Risk Factor Review - 06/02/24 1803       Core Components/Risk Factors/Patient Goals Review   Personal Goals Review Weight Management/Obesity;Heart Failure;Hypertension;Lipids    Review Leah Hernandez started cardiac rehab on 05/21/24. Leah Hernandez is off to a good start with excise. Vital signs have been  stable. Leah Hernandez's weight is up 3.2 kg since starting the program. the pharmacist at the heart failure clinic is aware of weight gain. Medications have been adjusted.    Expected Outcomes Leah Hernandez will continue to paritcipate on cardiac rehab for exercise, nutrition and lifestyle modifications          ITP Comments:  ITP Comments     Row Name 05/15/24 1026 05/21/24 1634 06/02/24 1759       ITP Comments ITP Comments Wilbert Bihari, MD: Medical Director. Introduction to the [facility] Cardiac Rehab. Initial orientation  packet reviewed with the patient. 30 day ITP Review. Lamira started cardiac rehab on 05/01/24. Vannie did well with exercise. 30 day ITP Review. Korra is off to a good start with exercise at cardiac rehab.        Comments: See ITP Comments

## 2024-06-04 ENCOUNTER — Encounter (HOSPITAL_COMMUNITY)
Admission: RE | Admit: 2024-06-04 | Discharge: 2024-06-04 | Disposition: A | Discharge status: Home / Self Care | Source: Ambulatory Visit | Attending: Cardiology | Admitting: Cardiology

## 2024-06-04 DIAGNOSIS — I5022 Chronic systolic (congestive) heart failure: Secondary | ICD-10-CM

## 2024-06-06 ENCOUNTER — Encounter (HOSPITAL_COMMUNITY): Admission: RE | Admit: 2024-06-06 | Source: Ambulatory Visit

## 2024-06-07 ENCOUNTER — Other Ambulatory Visit (HOSPITAL_COMMUNITY): Payer: Self-pay

## 2024-06-09 ENCOUNTER — Encounter (HOSPITAL_COMMUNITY): Admission: RE | Admit: 2024-06-09 | Source: Ambulatory Visit

## 2024-06-11 ENCOUNTER — Encounter (HOSPITAL_COMMUNITY)

## 2024-06-13 ENCOUNTER — Encounter (HOSPITAL_COMMUNITY)

## 2024-06-16 ENCOUNTER — Encounter (HOSPITAL_COMMUNITY)
Admission: RE | Admit: 2024-06-16 | Discharge: 2024-06-16 | Disposition: A | Discharge status: Home / Self Care | Source: Ambulatory Visit | Attending: Cardiology | Admitting: Cardiology

## 2024-06-16 ENCOUNTER — Telehealth (HOSPITAL_COMMUNITY): Payer: Self-pay | Admitting: Cardiology

## 2024-06-16 DIAGNOSIS — C184 Malignant neoplasm of transverse colon: Secondary | ICD-10-CM | POA: Diagnosis not present

## 2024-06-16 DIAGNOSIS — I5022 Chronic systolic (congestive) heart failure: Secondary | ICD-10-CM

## 2024-06-16 DIAGNOSIS — D649 Anemia, unspecified: Secondary | ICD-10-CM | POA: Diagnosis not present

## 2024-06-16 NOTE — Telephone Encounter (Signed)
 Called to confirm/remind patient of their appointment at the [facility] on 06/16/2024.   Appointment:   [] Confirmed  [x] Left mess   [] No answer/No voice mail  [] VM Full/unable to leave message  [] Phone not in service  Patient reminded to bring all medications and/or complete list.  Confirmed patient has transportation. Gave directions, instructed to utilize valet parking.

## 2024-06-17 ENCOUNTER — Encounter (HOSPITAL_COMMUNITY): Payer: Self-pay | Admitting: Cardiology

## 2024-06-17 ENCOUNTER — Other Ambulatory Visit (HOSPITAL_COMMUNITY): Payer: Self-pay

## 2024-06-17 ENCOUNTER — Ambulatory Visit (HOSPITAL_COMMUNITY)
Admission: RE | Admit: 2024-06-17 | Discharge: 2024-06-17 | Disposition: A | Discharge status: Home / Self Care | Source: Ambulatory Visit | Attending: Cardiology | Admitting: Cardiology

## 2024-06-17 VITALS — BP 104/70 | HR 88 | Wt 214.6 lb

## 2024-06-17 DIAGNOSIS — D649 Anemia, unspecified: Secondary | ICD-10-CM | POA: Diagnosis not present

## 2024-06-17 DIAGNOSIS — I5022 Chronic systolic (congestive) heart failure: Secondary | ICD-10-CM

## 2024-06-17 DIAGNOSIS — I428 Other cardiomyopathies: Secondary | ICD-10-CM | POA: Insufficient documentation

## 2024-06-17 DIAGNOSIS — Z85038 Personal history of other malignant neoplasm of large intestine: Secondary | ICD-10-CM | POA: Diagnosis not present

## 2024-06-17 DIAGNOSIS — N1831 Chronic kidney disease, stage 3a: Secondary | ICD-10-CM | POA: Diagnosis not present

## 2024-06-17 DIAGNOSIS — Z79899 Other long term (current) drug therapy: Secondary | ICD-10-CM | POA: Diagnosis not present

## 2024-06-17 DIAGNOSIS — Z7984 Long term (current) use of oral hypoglycemic drugs: Secondary | ICD-10-CM | POA: Diagnosis not present

## 2024-06-17 DIAGNOSIS — I5020 Unspecified systolic (congestive) heart failure: Secondary | ICD-10-CM

## 2024-06-17 LAB — BASIC METABOLIC PANEL WITH GFR
Anion gap: 10 (ref 5–15)
BUN: 40 mg/dL — ABNORMAL HIGH (ref 8–23)
CO2: 25 mmol/L (ref 22–32)
Calcium: 9.7 mg/dL (ref 8.9–10.3)
Chloride: 103 mmol/L (ref 98–111)
Creatinine, Ser: 1.72 mg/dL — ABNORMAL HIGH (ref 0.44–1.00)
GFR, Estimated: 33 mL/min — ABNORMAL LOW (ref >60)
Glucose, Bld: 80 mg/dL (ref 70–99)
Potassium: 5.2 mmol/L — ABNORMAL HIGH (ref 3.5–5.1)
Sodium: 138 mmol/L (ref 135–145)

## 2024-06-17 LAB — BRAIN NATRIURETIC PEPTIDE: B Natriuretic Peptide: 37.4 pg/mL (ref 0.0–100.0)

## 2024-06-17 MED ORDER — METOPROLOL SUCCINATE ER 100 MG PO TB24
150.0000 mg | ORAL_TABLET | Freq: Every day | ORAL | 5 refills | Status: AC
  Filled 2024-06-17 – 2024-06-29 (×2): qty 90, 60d supply, fill #0
  Filled 2024-08-24: qty 90, 60d supply, fill #1

## 2024-06-17 NOTE — Patient Instructions (Signed)
 CHANGE Toprol  to 150 mg ( 1 1/2 Tab) daily.  Labs done today, your results will be available in MyChart, we will contact you for abnormal readings.  Your physician has requested that you have an echocardiogram. Echocardiography is a painless test that uses sound waves to create images of your heart. It provides your doctor with information about the size and shape of your heart and how well your heart's chambers and valves are working. This procedure takes approximately one hour. There are no restrictions for this procedure. Please do NOT wear cologne, perfume, aftershave, or lotions (deodorant is allowed). Please arrive 15 minutes prior to your appointment time.  Please note: We ask at that you not bring children with you during ultrasound (echo/ vascular) testing. Due to room size and safety concerns, children are not allowed in the ultrasound rooms during exams. Our front office staff cannot provide observation of children in our lobby area while testing is being conducted. An adult accompanying a patient to their appointment will only be allowed in the ultrasound room at the discretion of the ultrasound technician under special circumstances. We apologize for any inconvenience.  Your physician recommends that you schedule a follow-up appointment in: 3 months with an echocardiogram ( February 206) ** PLEASE CALL THE OFFICE IN DECEMBER TO ARRANGE YOUR FOLLOW UP APPOINTMENT.**  If you have any questions or concerns before your next appointment please send us  a message through [place] or call our office at [phone].    TO LEAVE A MESSAGE FOR THE NURSE SELECT OPTION 2, PLEASE LEAVE A MESSAGE INCLUDING: YOUR NAME DATE OF BIRTH CALL BACK NUMBER REASON FOR CALL**this is important as we prioritize the call backs  YOU WILL RECEIVE A CALL BACK THE SAME DAY AS LONG AS YOU CALL BEFORE 4:00 PM  At the [facility], you and your health needs are our priority. As part of our continuing  mission to provide you with exceptional heart care, we have created designated Provider Care Teams. These Care Teams include your primary Cardiologist (physician) and Advanced Practice Providers (APPs- Physician Assistants and Nurse Practitioners) who all work together to provide you with the care you need, when you need it.   You may see any of the following providers on your designated Care Team at your next follow up: Dr Toribio Fuel Dr Ezra Shuck Dr. [name] Greig Mosses, NP Caffie Shed, [place] [facility] [place], [place] Beckey Coe, NP Jordan Lee, NP Ellouise Class, NP Tinnie Redman, PharmD Jaun Bash, PharmD   Please be sure to bring in all your medications bottles to every appointment.    Thank you for choosing  [facility]

## 2024-06-18 ENCOUNTER — Encounter (HOSPITAL_COMMUNITY)
Admission: RE | Admit: 2024-06-18 | Discharge: 2024-06-18 | Disposition: A | Source: Ambulatory Visit | Attending: Cardiology

## 2024-06-18 DIAGNOSIS — I5022 Chronic systolic (congestive) heart failure: Secondary | ICD-10-CM | POA: Diagnosis not present

## 2024-06-18 DIAGNOSIS — D649 Anemia, unspecified: Secondary | ICD-10-CM | POA: Diagnosis not present

## 2024-06-18 DIAGNOSIS — C184 Malignant neoplasm of transverse colon: Secondary | ICD-10-CM | POA: Diagnosis not present

## 2024-06-18 NOTE — Progress Notes (Signed)
 Reviewed home exercise guidelines with [facility] including endpoints, temperature precautions, target heart rate and rate of perceived exertion. She is currently walking 20 minutes 3 days/week as her mode of home exercise. Her goal is to stay healthy and to lose weight. We discussed increasing her walking duration from 20 to 30 minutes on some days to help achieve her weight loss goal and to achieve at least 150 minutes of aerobic exercise/ week. Leah Hernandez voices understanding of instructions given.  Leah CHRISTELLA Gal, MS, ACSM CEP

## 2024-06-19 NOTE — Progress Notes (Signed)
   ADVANCED HEART FAILURE FOLLOW UP CLINIC NOTE  Referring Physician: Theotis Haze ORN, NP  Primary Care: Theotis Haze ORN, NP Primary Cardiologist:  HPI: Leah Hernandez is a 63 y.o. female who presents for follow up of chronic systolic heart failure.      Admitted 11/2023 with near-syncope. Hgb was found to be 5.1. Underwent EGD/colonoscopy showing transverse colon mass concerning for malignancy; s/p biopsy, and gastritis. Pathology showed invasive adenocarcinoma. General surgery was consulted to evaluate for hemicolectomy. Prior to surgery, she required cardiac evaluation. During hospitaliztion, she was noted to have several runs of VT. Subsequently, echocardiogram was ordered and showed EF 20-25%, global hypokinesis, grade I DD, severe asymmetric LVH, RV normal. Cardiology consulted. Underwent R/LHC showing no CAD, stable filling pressures and preserved CI 3.6. She underwent successful R hemicolectomy on 12/18/23      SUBJECTIVE:  Patient overall reports that she is doing well, she denies any issues with chest pain, shortness of breath, lower extremity swelling, orthopnea. Weight has largely been stable.   PMH, current medications, allergies, social history, and family history reviewed in epic.  PHYSICAL EXAM: Vitals:   06/17/24 0939  BP: 104/70  Pulse: 88  SpO2: 100%   GENERAL: Well nourished and in no apparent distress at rest.  PULM:  Normal work of breathing, clear to auscultation bilaterally. Respirations are unlabored.  CARDIAC:  JVP: flat         Normal rate with regular rhythm. No murmurs, rubs or gallops.  Trace edema. Warm and well perfused extremities. ABDOMEN: Soft, non-tender, non-distended. NEUROLOGIC: Patient is oriented x3 with no focal or lateralizing neurologic deficits.    DATA REVIEW  ECG: 12/28/2023: NSR    ECHO: Echo 5/25: EF 20-25%, normal RV    CATH: R/LHC 5/25: no CAD, RHC with normal CI and filling pressures.   CMR: Normal LV size, mild  LVH, thinning of the mid inferior and inferolatearl wall segments, otherwise hypokinesis. LVEF 30%, RV function 42%, normal ECV   ASSESSMENT & PLAN:  Chronic Systolic Heart Failure: Unclear etiology. Mild LGE in the mid inferior and inferolateral walls consistent with infarct, but on review of coronary angiogram there is no significant disease (though poorly visualized anomalous circ). Prior discussion about ICD, had wanted to hold off given improving symptoms as well as minimal LGE on MRI.  - NICM primarily - Decrease lasix  to 20mg  prn - Increase metoprolol  succinate to 150mg  daily - Continue entresto  24/26mg  BID - Continue farxiga  10mg  daily - Continue spironolactone  25mg  daily  - Borderline K previously, repeat labs today. May need to hold MRA if worsening - Given controlled BP, heart rate, plan for repeat echo at next visit  Anemia - Most recent Hgb 11.5 on 01/02/2024 - Oncology arranging iron  infusions   Colon Cancer  - Pathology showed invasive adenocarcinoma - s/p hemicolectomy 12/18/23. 28 lymph nodes neg - Surgery was considered curative, but she has high risk features that place her at moderate risk for recurrence.  CKD stage IIIa:  - Continue farxiga , ARNI as above - repeat labs    Follow up in 3 months with echo  [name], MD [facility] Mechanical Circulatory Support 06/19/24

## 2024-06-20 ENCOUNTER — Encounter (HOSPITAL_COMMUNITY)
Admission: RE | Admit: 2024-06-20 | Discharge: 2024-06-20 | Disposition: A | Discharge status: Home / Self Care | Source: Ambulatory Visit | Attending: Cardiology | Admitting: Cardiology

## 2024-06-20 DIAGNOSIS — I5022 Chronic systolic (congestive) heart failure: Secondary | ICD-10-CM

## 2024-06-23 ENCOUNTER — Encounter (HOSPITAL_COMMUNITY)
Admission: RE | Admit: 2024-06-23 | Discharge: 2024-06-23 | Disposition: A | Source: Ambulatory Visit | Attending: Cardiology

## 2024-06-23 DIAGNOSIS — I5022 Chronic systolic (congestive) heart failure: Secondary | ICD-10-CM

## 2024-06-25 ENCOUNTER — Telehealth (HOSPITAL_COMMUNITY): Payer: Self-pay

## 2024-06-25 ENCOUNTER — Encounter (HOSPITAL_COMMUNITY): Admission: RE | Admit: 2024-06-25 | Source: Ambulatory Visit

## 2024-06-25 NOTE — Telephone Encounter (Signed)
 Patient c/o for 10:15 CR class, states she is out of town.

## 2024-06-27 ENCOUNTER — Encounter (HOSPITAL_COMMUNITY)

## 2024-06-29 ENCOUNTER — Other Ambulatory Visit (HOSPITAL_COMMUNITY): Payer: Self-pay

## 2024-06-30 ENCOUNTER — Other Ambulatory Visit: Payer: Self-pay

## 2024-06-30 ENCOUNTER — Inpatient Hospital Stay: Attending: Nurse Practitioner

## 2024-06-30 DIAGNOSIS — K2970 Gastritis, unspecified, without bleeding: Secondary | ICD-10-CM | POA: Diagnosis not present

## 2024-06-30 DIAGNOSIS — Z9049 Acquired absence of other specified parts of digestive tract: Secondary | ICD-10-CM | POA: Diagnosis not present

## 2024-06-30 DIAGNOSIS — N83202 Unspecified ovarian cyst, left side: Secondary | ICD-10-CM | POA: Diagnosis not present

## 2024-06-30 DIAGNOSIS — C183 Malignant neoplasm of hepatic flexure: Secondary | ICD-10-CM | POA: Insufficient documentation

## 2024-06-30 DIAGNOSIS — D539 Nutritional anemia, unspecified: Secondary | ICD-10-CM | POA: Diagnosis not present

## 2024-06-30 DIAGNOSIS — N1831 Chronic kidney disease, stage 3a: Secondary | ICD-10-CM | POA: Insufficient documentation

## 2024-06-30 DIAGNOSIS — Z7984 Long term (current) use of oral hypoglycemic drugs: Secondary | ICD-10-CM | POA: Diagnosis not present

## 2024-06-30 DIAGNOSIS — Z79899 Other long term (current) drug therapy: Secondary | ICD-10-CM | POA: Diagnosis not present

## 2024-06-30 DIAGNOSIS — E538 Deficiency of other specified B group vitamins: Secondary | ICD-10-CM | POA: Insufficient documentation

## 2024-06-30 DIAGNOSIS — D509 Iron deficiency anemia, unspecified: Secondary | ICD-10-CM | POA: Insufficient documentation

## 2024-06-30 LAB — CBC WITH DIFFERENTIAL (CANCER CENTER ONLY)
Abs Immature Granulocytes: 0.01 K/uL (ref 0.00–0.07)
Basophils Absolute: 0 K/uL (ref 0.0–0.1)
Basophils Relative: 0 %
Eosinophils Absolute: 0.1 K/uL (ref 0.0–0.5)
Eosinophils Relative: 3 %
HCT: 36.2 % (ref 36.0–46.0)
Hemoglobin: 11.7 g/dL — ABNORMAL LOW (ref 12.0–15.0)
Immature Granulocytes: 0 %
Lymphocytes Relative: 45 %
Lymphs Abs: 2.1 K/uL (ref 0.7–4.0)
MCH: 31.1 pg (ref 26.0–34.0)
MCHC: 32.3 g/dL (ref 30.0–36.0)
MCV: 96.3 fL (ref 80.0–100.0)
Monocytes Absolute: 0.3 K/uL (ref 0.1–1.0)
Monocytes Relative: 7 %
Neutro Abs: 2.1 K/uL (ref 1.7–7.7)
Neutrophils Relative %: 45 %
Platelet Count: 202 K/uL (ref 150–400)
RBC: 3.76 MIL/uL — ABNORMAL LOW (ref 3.87–5.11)
RDW: 13.5 % (ref 11.5–15.5)
WBC Count: 4.6 K/uL (ref 4.0–10.5)
nRBC: 0 % (ref 0.0–0.2)

## 2024-06-30 LAB — CMP (CANCER CENTER ONLY)
ALT: 16 U/L (ref 0–44)
AST: 21 U/L (ref 15–41)
Albumin: 4.3 g/dL (ref 3.5–5.0)
Alkaline Phosphatase: 69 U/L (ref 38–126)
Anion gap: 9 (ref 5–15)
BUN: 24 mg/dL — ABNORMAL HIGH (ref 8–23)
CO2: 23 mmol/L (ref 22–32)
Calcium: 9.7 mg/dL (ref 8.9–10.3)
Chloride: 107 mmol/L (ref 98–111)
Creatinine: 1.36 mg/dL — ABNORMAL HIGH (ref 0.44–1.00)
GFR, Estimated: 44 mL/min — ABNORMAL LOW (ref >60)
Glucose, Bld: 88 mg/dL (ref 70–99)
Potassium: 4.9 mmol/L (ref 3.5–5.1)
Sodium: 138 mmol/L (ref 135–145)
Total Bilirubin: 0.6 mg/dL (ref 0.0–1.2)
Total Protein: 7.5 g/dL (ref 6.5–8.1)

## 2024-06-30 LAB — CEA (ACCESS): CEA (CHCC): 1.37 ng/mL (ref 0.00–5.00)

## 2024-06-30 LAB — IRON AND IRON BINDING CAPACITY (CC-WL,HP ONLY)
Iron: 113 ug/dL (ref 28–170)
Saturation Ratios: 33 % — ABNORMAL HIGH (ref 10.4–31.8)
TIBC: 342 ug/dL (ref 250–450)
UIBC: 229 ug/dL

## 2024-06-30 LAB — FERRITIN: Ferritin: 189 ng/mL (ref 11–307)

## 2024-07-02 ENCOUNTER — Encounter (HOSPITAL_COMMUNITY)
Admission: RE | Admit: 2024-07-02 | Discharge: 2024-07-02 | Disposition: A | Source: Ambulatory Visit | Attending: Cardiology

## 2024-07-02 DIAGNOSIS — I5022 Chronic systolic (congestive) heart failure: Secondary | ICD-10-CM | POA: Diagnosis present

## 2024-07-02 NOTE — Progress Notes (Signed)
 Cardiac Individual Treatment Plan  Patient Details  Name: Leah Hernandez MRN: [id number] Date of Birth: 1960/10/26 Referring Provider:   Flowsheet Row CARDIAC REHAB PHASE II ORIENTATION from 05/15/2024 in [facility]  Referring Provider [name], MD    Initial Encounter Date:  Flowsheet Row CARDIAC REHAB PHASE II ORIENTATION from 05/15/2024 in [facility]  Date 05/15/24    Visit Diagnosis: Heart failure, chronic systolic (HCC)  Patient's Home Medications on Admission:  Current Outpatient Medications:    acetaminophen  (TYLENOL ) 325 MG tablet, Take 325-650 mg by mouth every 6 (six) hours as needed for mild pain (pain score 1-3) or moderate pain (pain score 4-6)., Disp: , Rfl:    FARXIGA  10 MG TABS tablet, Take 1 tablet (10 mg total) by mouth daily., Disp: 90 tablet, Rfl: 1   furosemide  (LASIX ) 40 MG tablet, Take 1 tablet (40 mg total) by mouth daily as needed for fluid or edema (Weight gain >3 lbs in a day or >5 lbs in one week)., Disp: 30 tablet, Rfl: 5   methocarbamol  (ROBAXIN ) 500 MG tablet, Take 500 mg by mouth 3 (three) times daily., Disp: , Rfl:    metoprolol  succinate (TOPROL -XL) 100 MG 24 hr tablet, Take 1.5 tablets (150 mg total) by mouth daily., Disp: 90 tablet, Rfl: 5   polyethylene glycol powder (GLYCOLAX /MIRALAX ) 17 GM/SCOOP powder, Take 17 g by mouth daily., Disp: 238 g, Rfl: 0   sacubitril -valsartan  (ENTRESTO ) 24-26 MG, Take 1 tablet by mouth 2 (two) times daily., Disp: 60 tablet, Rfl: 2   spironolactone  (ALDACTONE ) 25 MG tablet, Take 1 tablet (25 mg total) by mouth daily., Disp: 30 tablet, Rfl: 5  Past Medical History: Past Medical History:  Diagnosis Date   Prediabetes     Tobacco Use: Social History   Tobacco Use  Smoking Status Never  Smokeless Tobacco Never    Labs: Review Flowsheet       Latest Ref Rng & Units 03/27/2017  09/29/2019 12/16/2023 12/17/2023  Labs for ITP Cardiac and Pulmonary Rehab  Cholestrol 100 - 199 mg/dL - 789  - -  LDL (calc) 0 - 99 mg/dL - 81  - -  HDL-C >60 mg/dL - 884  - -  Trlycerides 0 - 149 mg/dL - 81  - -  Hemoglobin J8r 4.8 - 5.6 % 5.7  5.7  5.4  5.2   PH, Arterial 7.35 - 7.45 - - - 7.400   PCO2 arterial 32 - 48 mmHg - - - 34.2   Bicarbonate 20.0 - 28.0 mmol/L - - - 21.2  21.8  21.9   TCO2 22 - 32 mmol/L - - - 22  23  23    Acid-base deficit 0.0 - 2.0 mmol/L - - - 3.0  3.0  3.0   O2 Saturation % - - - 96  66  65     Details       Multiple values from one day are sorted in reverse-chronological order          Exercise Target Goals: Exercise Program Goal: Individual exercise prescription set using results from initial 6 min walk test and THRR while considering  patient's activity barriers and safety.   Exercise Prescription Goal: Initial exercise prescription builds to 30-45 minutes a day of aerobic activity, 2-3 days per week.  Home exercise guidelines will be given to patient during program as part of exercise prescription that the  participant will acknowledge.   Education: Aerobic Exercise: - Group verbal and visual presentation on the components of exercise prescription. Introduces F.I.T.T principle from ACSM for exercise prescriptions.  Reviews F.I.T.T. principles of aerobic exercise including progression. Written material provided at class time.   Education: Resistance Exercise: - Group verbal and visual presentation on the components of exercise prescription. Introduces F.I.T.T principle from ACSM for exercise prescriptions  Reviews F.I.T.T. principles of resistance exercise including progression. Written material provided at class time.    Education: Exercise & Equipment Safety: - Individual verbal instruction and demonstration of equipment use and safety with use of the equipment.   Education: Exercise Physiology & General Exercise Guidelines: - Group verbal  and written instruction with models to review the exercise physiology of the cardiovascular system and associated critical values. Provides general exercise guidelines with specific guidelines to those with heart or lung disease. Written material provided at class time.   Education: Flexibility, Balance, Mind/Body Relaxation: - Group verbal and visual presentation with interactive activity on the components of exercise prescription. Introduces F.I.T.T principle from ACSM for exercise prescriptions. Reviews F.I.T.T. principles of flexibility and balance exercise training including progression. Also discusses the mind body connection.  Reviews various relaxation techniques to help reduce and manage stress (i.e. Deep breathing, progressive muscle relaxation, and visualization). Balance handout provided to take home. Written material provided at class time.   Activity Barriers & Risk Stratification:  Activity Barriers & Cardiac Risk Stratification - 05/15/24 1519       Activity Barriers & Cardiac Risk Stratification   Activity Barriers Balance Concerns;Joint Problems;Arthritis;Back Problems;Deconditioning;Decreased Ventricular Function    Cardiac Risk Stratification High          6 Minute Walk:  6 Minute Walk     Row Name 05/15/24 1159         6 Minute Walk   Phase Initial     Distance 1010 feet     Walk Time 6 minutes     # of Rest Breaks 0     MPH 1.91     METS 2.22     RPE 11     Perceived Dyspnea  0     VO2 Peak 7.8     Symptoms No     Resting HR 73 bpm     Resting BP 104/62     Resting Oxygen Saturation  99 %     Exercise Oxygen Saturation  during 6 min walk 99 %     Max Ex. HR 88 bpm     Max Ex. BP 122/80     2 Minute Post BP 110/70        Oxygen Initial Assessment:   Oxygen Re-Evaluation:   Oxygen Discharge (Final Oxygen Re-Evaluation):   Initial Exercise Prescription:  Initial Exercise Prescription - 05/15/24 1500       Date of Initial Exercise RX and  Referring Provider   Date 05/15/24    Referring Provider [name], MD    Expected Discharge Date 08/06/24      NuStep   Level 1    SPM 70    Minutes 15    METs 1.8      Track   Laps 10    Minutes 15    METs 2.5      Prescription Details   Frequency (times per week) 3    Duration Progress to 30 minutes of continuous aerobic without signs/symptoms of physical distress      Intensity   THRR  40-80% of Max Heartrate 63-126    Ratings of Perceived Exertion 11-13    Perceived Dyspnea 0-4      Progression   Progression Continue progressive overload as per policy without signs/symptoms or physical distress.      Resistance Training   Training Prescription Yes    Reps 10-15          Perform Capillary Blood Glucose checks as needed.  Exercise Prescription Changes:   Exercise Prescription Changes     Row Name 05/21/24 1021 06/02/24 1034 06/16/24 1031 06/18/24 1033 06/23/24 1034     Response to Exercise   Blood Pressure (Admit) 112/78 98/60 94/60  100/62 118/66   Blood Pressure (Exercise) 116/60 -- 110/66 -- 90/46   Blood Pressure (Exit) 104/72 98/64 96/68  110/70 107/70   Heart Rate (Admit) 74 bpm 82 bpm 78 bpm 80 bpm 80 bpm   Heart Rate (Exercise) 111 bpm 116 bpm 113 bpm 120 bpm 119 bpm   Heart Rate (Exit) 83 bpm 86 bpm 85 bpm 89 bpm 91 bpm   Rating of Perceived Exertion (Exercise) 11 11 9 11 11    Symptoms None None None None None   Comments Off to a good start with exercise. -- -- Reviewed home exercise guidelines and goals with Leah Hernandez. --   Duration Continue with 30 min of aerobic exercise without signs/symptoms of physical distress. Continue with 30 min of aerobic exercise without signs/symptoms of physical distress. Continue with 30 min of aerobic exercise without signs/symptoms of physical distress. Continue with 30 min of aerobic exercise without signs/symptoms of physical distress. Continue with 30 min of aerobic exercise without signs/symptoms of physical  distress.   Intensity THRR unchanged THRR unchanged THRR unchanged THRR unchanged THRR unchanged     Progression   Progression Continue to progress workloads to maintain intensity without signs/symptoms of physical distress. Continue to progress workloads to maintain intensity without signs/symptoms of physical distress. Continue to progress workloads to maintain intensity without signs/symptoms of physical distress. Continue to progress workloads to maintain intensity without signs/symptoms of physical distress. Continue to progress workloads to maintain intensity without signs/symptoms of physical distress.   Average METs 2.7 2.5 2.6 2.5 2.9     Resistance Training   Training Prescription No Yes Yes No Yes   Weight Relaxation day, no weights 2 lbs 2 lbs Relaxation day. No weights. 3 lbs   Reps -- 10-15 10-15 -- 10-15   Time -- 5 Minutes 5 Minutes -- 5 Minutes     Interval Training   Interval Training No No No No No     NuStep   Level 1 1 1 2 2    SPM 54 74 78 65 81   Minutes 15 15 15 15 15    METs 1.7 1.9 1.9 1.8 2     Track   Laps 22 16 18 17 22    Minutes 15 15 15 15 15    METs 3.81 3.04 3.3 3.17 3.81     Home Exercise Plan   Plans to continue exercise at -- -- -- Home (comment)  Walking Home (comment)  Walking   Frequency -- -- -- Add 3 additional days to program exercise sessions. Add 3 additional days to program exercise sessions.   Initial Home Exercises Provided -- -- -- 06/18/24 06/18/24      Exercise Comments:   Exercise Comments     Row Name 05/21/24 1128 06/18/24 1108         Exercise Comments Leah Hernandez tolerated low intensity exercise  well without symptoms. Oriented to the exercise equipment and stretching routine. Reviewed home exercise guidelines and goals with Leah Hernandez.         Exercise Goals and Review:   Exercise Goals     Row Name 05/15/24 1521             Exercise Goals   Increase Physical Activity Yes       Intervention Provide advice,  education, support and counseling about physical activity/exercise needs.;Develop an individualized exercise prescription for aerobic and resistive training based on initial evaluation findings, risk stratification, comorbidities and participant's personal goals.       Expected Outcomes Short Term: Attend rehab on a regular basis to increase amount of physical activity.;Long Term: Exercising regularly at least 3-5 days a week.;Long Term: Add in home exercise to make exercise part of routine and to increase amount of physical activity.       Increase Strength and Stamina Yes       Intervention Provide advice, education, support and counseling about physical activity/exercise needs.;Develop an individualized exercise prescription for aerobic and resistive training based on initial evaluation findings, risk stratification, comorbidities and participant's personal goals.       Expected Outcomes Short Term: Increase workloads from initial exercise prescription for resistance, speed, and METs.;Short Term: Perform resistance training exercises routinely during rehab and add in resistance training at home;Long Term: Improve cardiorespiratory fitness, muscular endurance and strength as measured by increased METs and functional capacity ( )       Able to understand and use rate of perceived exertion (RPE) scale Yes       Intervention Provide education and explanation on how to use RPE scale       Expected Outcomes Short Term: Able to use RPE daily in rehab to express subjective intensity level;Long Term:  Able to use RPE to guide intensity level when exercising independently       Knowledge and understanding of Target Heart Rate Range (THRR) Yes       Intervention Provide education and explanation of THRR including how the numbers were predicted and where they are located for reference       Expected Outcomes Short Term: Able to state/look up THRR;Long Term: Able to use THRR to govern intensity when exercising  independently;Short Term: Able to use daily as guideline for intensity in rehab       Understanding of Exercise Prescription Yes       Intervention Provide education, explanation, and written materials on patient's individual exercise prescription       Expected Outcomes Short Term: Able to explain program exercise prescription;Long Term: Able to explain home exercise prescription to exercise independently          Exercise Goals Re-Evaluation :  Exercise Goals Re-Evaluation     Row Name 05/21/24 1128 06/18/24 1108           Exercise Goal Re-Evaluation   Exercise Goals Review Increase Physical Activity;Increase Strength and Stamina;Able to understand and use rate of perceived exertion (RPE) scale Increase Physical Activity;Increase Strength and Stamina;Able to understand and use rate of perceived exertion (RPE) scale;Understanding of Exercise Prescription;Knowledge and understanding of Target Heart Rate Range (THRR)      Comments Leah Hernandez is able to understand and use RPE scale appropriately. Reviewed exercise prescription with Leah Hernandez. She is walking 3 days/week. Her goal is to stay healthy and lose weight. Will increase hand weights from 2 to 3 lbs next session      Expected Outcomes Progress  workloads as tolerated to help improve cardiorespiratory fitness. Leah Hernandez will walk 20-30 minutes 3 days/week to help achieve health and fitness goals.         Discharge Exercise Prescription (Final Exercise Prescription Changes):  Exercise Prescription Changes - 06/23/24 1034       Response to Exercise   Blood Pressure (Admit) 118/66    Blood Pressure (Exercise) 90/46    Blood Pressure (Exit) 107/70    Heart Rate (Admit) 80 bpm    Heart Rate (Exercise) 119 bpm    Heart Rate (Exit) 91 bpm    Rating of Perceived Exertion (Exercise) 11    Symptoms None    Duration Continue with 30 min of aerobic exercise without signs/symptoms of physical distress.    Intensity THRR unchanged       Progression   Progression Continue to progress workloads to maintain intensity without signs/symptoms of physical distress.    Average METs 2.9      Resistance Training   Training Prescription Yes    Weight 3 lbs    Reps 10-15    Time 5 Minutes      Interval Training   Interval Training No      NuStep   Level 2    SPM 81    Minutes 15    METs 2      Track   Laps 22    Minutes 15    METs 3.81      Home Exercise Plan   Plans to continue exercise at Home (comment)   Walking   Frequency Add 3 additional days to program exercise sessions.    Initial Home Exercises Provided 06/18/24          Nutrition:  Target Goals: Understanding of nutrition guidelines, daily intake of sodium 1500mg , cholesterol 200mg , calories 30% from fat and 7% or less from saturated fats, daily to have 5 or more servings of fruits and vegetables.  Education: Nutrition 1 -Group instruction provided by verbal, written material, interactive activities, discussions, models, and posters to present general guidelines for heart healthy nutrition including macronutrients, label reading, and promoting whole foods over processed counterparts. Education serves as pensions consultant of discussion of heart healthy eating for all. Written material provided at class time.    Education: Nutrition 2 -Group instruction provided by verbal, written material, interactive activities, discussions, models, and posters to present general guidelines for heart healthy nutrition including sodium, cholesterol, and saturated fat. Providing guidance of habit forming to improve blood pressure, cholesterol, and body weight. Written material provided at class time.     Biometrics:  Pre Biometrics - 05/15/24 1100       Pre Biometrics   Waist Circumference 41 inches    Hip Circumference 50 inches    Waist to Hip Ratio 0.82 %    Triceps Skinfold 31 mm    % Body Fat 44.2 %    Grip Strength 32 kg    Flexibility 16 in    Single Leg  Stand 10 seconds           Nutrition Therapy Plan and Nutrition Goals:   Nutrition Assessments:  MEDIFICTS Score Key: >=70 Need to make dietary changes  40-70 Heart Healthy Diet <= 40 Therapeutic Level Cholesterol Diet   Picture Your Plate Scores: <59 Unhealthy dietary pattern with much room for improvement. 41-50 Dietary pattern unlikely to meet recommendations for good health and room for improvement. 51-60 More healthful dietary pattern, with some room for improvement.  >60 Healthy dietary  pattern, although there may be some specific behaviors that could be improved.    Nutrition Goals Re-Evaluation:   Nutrition Goals Discharge (Final Nutrition Goals Re-Evaluation):   Psychosocial: Target Goals: Acknowledge presence or absence of significant depression and/or stress, maximize coping skills, provide positive support system. Participant is able to verbalize types and ability to use techniques and skills needed for reducing stress and depression.   Education: Stress, Anxiety, and Depression - Group verbal and visual presentation to define topics covered.  Reviews how body is impacted by stress, anxiety, and depression.  Also discusses healthy ways to reduce stress and to treat/manage anxiety and depression. Written material provided at class time.   Education: Sleep Hygiene -Provides group verbal and written instruction about how sleep can affect your health.  Define sleep hygiene, discuss sleep cycles and impact of sleep habits. Review good sleep hygiene tips.   Initial Review & Psychosocial Screening:  Initial Psych Review & Screening - 05/15/24 1552       Initial Review   Current issues with None Identified      Family Dynamics   Good Support System? Yes   Kids and husband     Screening Interventions   Interventions Encouraged to exercise;To provide support and resources with identified psychosocial needs;Provide feedback about the scores to participant     Expected Outcomes Long Term Goal: Stressors or current issues are controlled or eliminated.;Short Term goal: Identification and review with participant of any Quality of Life or Depression concerns found by scoring the questionnaire.;Long Term goal: The participant improves quality of Life and PHQ9 Scores as seen by post scores and/or verbalization of changes          Quality of Life Scores:   Quality of Life - 05/15/24 1504       Quality of Life   Select Quality of Life      Quality of Life Scores   Health/Function Pre 26 %    Socioeconomic Pre 25.06 %    Psych/Spiritual Pre 29.58 %    Family Pre 28.8 %    GLOBAL Pre 27.09 %         Scores of 19 and below usually indicate a poorer quality of life in these areas.  A difference of  2-3 points is a clinically meaningful difference.  A difference of 2-3 points in the total score of the Quality of Life Index has been associated with significant improvement in overall quality of life, self-image, physical symptoms, and general health in studies assessing change in quality of life.  PHQ-9: Review Flowsheet  More data exists      05/15/2024 12/26/2023 09/29/2019 04/30/2017 04/23/2017  Depression screen PHQ 2/9  Decreased Interest 0 0 0 0 0  Down, Depressed, Hopeless 0 0 0 0 0  PHQ - 2 Score 0 0 0 0 0  Altered sleeping 0 - - - -  Tired, decreased energy 0 - - - -  Change in appetite 0 - - - -  Feeling bad or failure about yourself  0 - - - -  Trouble concentrating 0 - - - -  Moving slowly or fidgety/restless 0 - - - -  Suicidal thoughts 0 - - - -  PHQ-9 Score 0  - - - -    Details       Data saved with a previous flowsheet row definition        Interpretation of Total Score  Total Score Depression Severity:  1-4 = Minimal depression,  5-9 = Mild depression, 10-14 = Moderate depression, 15-19 = Moderately severe depression, 20-27 = Severe depression   Psychosocial Evaluation and Intervention:   Psychosocial Re-Evaluation:   Psychosocial Re-Evaluation     Row Name 05/21/24 1636 06/02/24 1800 07/01/24 1617         Psychosocial Re-Evaluation   Current issues with None Identified None Identified None Identified     Comments -- -- Leah Hernandez admits that she has been through alot this past year. Leah Hernandez has not voiced any increased concerns or stressors during exercise at cardiac rehab.     Interventions Encouraged to attend Cardiac Rehabilitation for the exercise Encouraged to attend Cardiac Rehabilitation for the exercise Encouraged to attend Cardiac Rehabilitation for the exercise     Continue Psychosocial Services  No Follow up required No Follow up required No Follow up required        Psychosocial Discharge (Final Psychosocial Re-Evaluation):  Psychosocial Re-Evaluation - 07/01/24 1617       Psychosocial Re-Evaluation   Current issues with None Identified    Comments Leah Hernandez admits that she has been through alot this past year. Leah Hernandez has not voiced any increased concerns or stressors during exercise at cardiac rehab.    Interventions Encouraged to attend Cardiac Rehabilitation for the exercise    Continue Psychosocial Services  No Follow up required          Vocational Rehabilitation: Provide vocational rehab assistance to qualifying candidates.   Vocational Rehab Evaluation & Intervention:  Vocational Rehab - 05/15/24 1505       Initial Vocational Rehab Evaluation & Intervention   Assessment shows need for Vocational Rehabilitation No   Pt is on Disability         Education: Education Goals: Education classes will be provided on a variety of topics geared toward better understanding of heart health and risk factor modification. Participant will state understanding/return demonstration of topics presented as noted by education test scores.  Learning Barriers/Preferences:  Learning Barriers/Preferences - 05/15/24 1506       Learning Barriers/Preferences   Learning Barriers None     Learning Preferences Audio;Computer/Internet;Group Instruction;Individual Instruction;Skilled Demonstration;Pictoral;Verbal Instruction;Video;Written Material          General Cardiac Education Topics:  AED/CPR: - Group verbal and written instruction with the use of models to demonstrate the basic use of the AED with the basic ABC's of resuscitation.   Test and Procedures: - Group verbal and visual presentation and models provide information about basic cardiac anatomy and function. Reviews the testing methods done to diagnose heart disease and the outcomes of the test results. Describes the treatment choices: Medical Management, Angioplasty, or Coronary Bypass Surgery for treating various heart conditions including Myocardial Infarction, Angina, Valve Disease, and Cardiac Arrhythmias. Written material provided at class time.   Medication Safety: - Group verbal and visual instruction to review commonly prescribed medications for heart and lung disease. Reviews the medication, class of the drug, and side effects. Includes the steps to properly store meds and maintain the prescription regimen. Written material provided at class time.   Intimacy: - Group verbal instruction through game format to discuss how heart and lung disease can affect sexual intimacy. Written material provided at class time.   Know Your Numbers and Heart Failure: - Group verbal and visual instruction to discuss disease risk factors for cardiac and pulmonary disease and treatment options.  Reviews associated critical values for Overweight/Obesity, Hypertension, Cholesterol, and Diabetes.  Discusses basics of heart failure: signs/symptoms and treatments.  Introduces  Heart Failure Zone chart for action plan for heart failure. Written material provided at class time.   Infection Prevention: - Provides verbal and written material to individual with discussion of infection control including proper hand washing and proper  equipment cleaning during exercise session.   Falls Prevention: - Provides verbal and written material to individual with discussion of falls prevention and safety.   Other: -Provides group and verbal instruction on various topics (see comments)   Knowledge Questionnaire Score:  Knowledge Questionnaire Score - 05/15/24 1505       Knowledge Questionnaire Score   Pre Score 21/24          Core Components/Risk Factors/Patient Goals at Admission:  Personal Goals and Risk Factors at Admission - 05/15/24 1547       Core Components/Risk Factors/Patient Goals on Admission    Weight Management Yes;Weight Loss    Intervention Weight Management: Develop a combined nutrition and exercise program designed to reach desired caloric intake, while maintaining appropriate intake of nutrient and fiber, sodium and fats, and appropriate energy expenditure required for the weight goal.;Weight Management: Provide education and appropriate resources to help participant work on and attain dietary goals.    Expected Outcomes Short Term: Continue to assess and modify interventions until short term weight is achieved;Long Term: Adherence to nutrition and physical activity/exercise program aimed toward attainment of established weight goal;Weight Loss: Understanding of general recommendations for a balanced deficit meal plan, which promotes 1-2 lb weight loss per week and includes a negative energy balance of [phone] kcal/d;Understanding recommendations for meals to include 15-35% energy as protein, 25-35% energy from fat, 35-60% energy from carbohydrates, less than 200mg  of dietary cholesterol, 20-35 gm of total fiber daily;Understanding of distribution of calorie intake throughout the day with the consumption of 4-5 meals/snacks    Heart Failure Yes    Intervention Provide a combined exercise and nutrition program that is supplemented with education, support and counseling about heart failure. Directed toward  relieving symptoms such as shortness of breath, decreased exercise tolerance, and extremity edema.    Expected Outcomes Improve functional capacity of life;Short term: Attendance in program 2-3 days a week with increased exercise capacity. Reported lower sodium intake. Reported increased fruit and vegetable intake. Reports medication compliance.;Short term: Daily weights obtained and reported for increase. Utilizing diuretic protocols set by physician.;Long term: Adoption of self-care skills and reduction of barriers for early signs and symptoms recognition and intervention leading to self-care maintenance.    Hypertension Yes    Intervention Provide education on lifestyle modifcations including regular physical activity/exercise, weight management, moderate sodium restriction and increased consumption of fresh fruit, vegetables, and low fat dairy, alcohol moderation, and smoking cessation.;Monitor prescription use compliance.    Expected Outcomes Short Term: Continued assessment and intervention until BP is < 140/54mm HG in hypertensive participants. < 130/40mm HG in hypertensive participants with diabetes, heart failure or chronic kidney disease.;Long Term: Maintenance of blood pressure at goal levels.    Lipids Yes    Intervention Provide education and support for participant on nutrition & aerobic/resistive exercise along with prescribed medications to achieve LDL 70mg , HDL >40mg .    Expected Outcomes Short Term: Participant states understanding of desired cholesterol values and is compliant with medications prescribed. Participant is following exercise prescription and nutrition guidelines.;Long Term: Cholesterol controlled with medications as prescribed, with individualized exercise RX and with personalized nutrition plan. Value goals: LDL < 70mg , HDL > 40 mg.    Personal Goal Other Yes    Personal Goal  ST and long term: stabilize weight (lower fluid), eat healthier, increase flexibility     Intervention Will continue to monitor pt and progress workloads as tolerated without sign or symptom    Expected Outcomes Pt will achieve her goals          Education:Diabetes - Individual verbal and written instruction to review signs/symptoms of diabetes, desired ranges of glucose level fasting, after meals and with exercise. Acknowledge that pre and post exercise glucose checks will be done for 3 sessions at entry of program.   Core Components/Risk Factors/Patient Goals Review:   Goals and Risk Factor Review     Row Name 05/21/24 1637 06/02/24 1803 07/01/24 1619         Core Components/Risk Factors/Patient Goals Review   Personal Goals Review Weight Management/Obesity;Heart Failure;Hypertension;Lipids Weight Management/Obesity;Heart Failure;Hypertension;Lipids Weight Management/Obesity;Heart Failure;Hypertension;Lipids     Review Leah Hernandez started cardiac rehab on 05/21/24. Leah Hernandez did well with excise. Vital signs were stable. Spot checked CBG's WNL. Leah Hernandez does not check her CBG's at home. Leah Hernandez started cardiac rehab on 05/21/24. Leah Hernandez is off to a good start with excise. Vital signs have been  stable. Leah Hernandez's weight is up 3.2 kg since starting the program. the pharmacist at the heart failure clinic is aware of weight gain. Medications have been adjusted. Leah Hernandez is doing well with exercise at  cardiac rehab when in attendance.. Vital signs have been stable. Leah Hernandez's weight is up 5.6  kg since starting the program.     Expected Outcomes Leah Hernandez will continue to paritcipate on cardiac rehab for exercise, nutrition and lifestyle modifications Leah Hernandez will continue to paritcipate on cardiac rehab for exercise, nutrition and lifestyle modifications Myles will continue to paritcipate on cardiac rehab for exercise, nutrition and lifestyle modifications        Core Components/Risk Factors/Patient Goals at Discharge (Final Review):   Goals and Risk Factor Review - 07/01/24 1619        Core Components/Risk Factors/Patient Goals Review   Personal Goals Review Weight Management/Obesity;Heart Failure;Hypertension;Lipids    Review Leah Hernandez is doing well with exercise at  cardiac rehab when in attendance.. Vital signs have been stable. Leah Hernandez's weight is up 5.6  kg since starting the program.    Expected Outcomes Jaylie will continue to paritcipate on cardiac rehab for exercise, nutrition and lifestyle modifications          ITP Comments:  ITP Comments     Row Name 05/15/24 1026 05/21/24 1634 06/02/24 1759 07/01/24 1616     ITP Comments ITP Comments Wilbert Bihari, MD: Medical Director. Introduction to the [facility]/Intensive Cardiac Rehab. Initial orientation packet reviewed with the patient. 30 day ITP Review. Magdalen started cardiac rehab on 05/01/24. Harmoni did well with exercise. 30 day ITP Review. Bonetta is off to a good start with exercise at cardiac rehab. 30 day ITP Review. Sameera has good participation  with exercise at cardiac rehab when in attendance.       Comments: See ITP Comments

## 2024-07-04 ENCOUNTER — Encounter (HOSPITAL_COMMUNITY)
Admission: RE | Admit: 2024-07-04 | Discharge: 2024-07-04 | Disposition: A | Source: Ambulatory Visit | Attending: Cardiology

## 2024-07-04 DIAGNOSIS — I5022 Chronic systolic (congestive) heart failure: Secondary | ICD-10-CM | POA: Diagnosis not present

## 2024-07-06 NOTE — Assessment & Plan Note (Signed)
 G2, pT3 N0 M0, stage IIA, MMR normal -Presented with IDA, 30 lbs unintentional weight loss, constipation with rectal bleeding.  -EGD/Colonoscopy by Dr. Suzann 12/14/23 showing gastritis and small hernia as well as a malignant appearing partially obstructing 7 cm tumor in the transverse colon. Path confirmed adenocarcinoma.  -continue surveillance

## 2024-07-07 ENCOUNTER — Inpatient Hospital Stay: Admitting: Hematology

## 2024-07-07 VITALS — BP 106/63 | HR 77 | Temp 96.9°F | Resp 17 | Wt 222.1 lb

## 2024-07-07 DIAGNOSIS — C182 Malignant neoplasm of ascending colon: Secondary | ICD-10-CM | POA: Diagnosis not present

## 2024-07-07 DIAGNOSIS — C183 Malignant neoplasm of hepatic flexure: Secondary | ICD-10-CM | POA: Diagnosis not present

## 2024-07-07 NOTE — Progress Notes (Signed)
 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  -EGD/Colonoscopy by Dr. Suzann 12/14/23 showing gastritis and small hernia as well as a malignant appearing partially obstructing 7 cm tumor in the transverse colon. Path confirmed adenocarcinoma.  -continue surveillance   Assessment & Plan Colon cancer, stage II, status post resection, under surveillance Stage II colon cancer diagnosed in May 2025, status post resection. Currently under surveillance with no need for chemotherapy. Tumor marker levels are stable. GuardianReveal test results are pending. - Scheduled CT scan for December 18th, 2025 - Will plan for colonoscopy in May 2026 - Will follow up in four months for lab and monitoring  Nutritional anemia due to iron  and B12 deficiency Nutritional anemia likely secondary to iron  and B12 deficiency, possibly related to colon cancer. Hemoglobin has decreased from 13.7 to 11.7 over the past two months. B12 levels are likely still low. - Recommended taking prenatal multivitamin or separate B12 and iron  supplements  Chronic kidney disease stage 3A Chronic kidney disease stage 3A with creatinine levels fluctuating around baseline. Recent creatinine level was 1.3, previously as high as 1.8. - Advised to maintain adequate hydration  Plan - She is clinically doing well, lab reviewed, mild anemia.  Current concern for cancer recurrence - I recommended her to start oral B12, or at least  prenatal vitamins. - She is scheduled for a surveillance CT scan on December 18, we will call her with scan results. - Lab and follow-up in 4 months   SUMMARY OF ONCOLOGIC HISTORY: Oncology History  Primary cancer of hepatic flexure of colon (HCC)  12/14/2023 Initial Diagnosis   Primary cancer of hepatic flexure of colon (HCC)   12/26/2023 Cancer Staging   Staging form: Colon and Rectum, AJCC 8th Edition - Pathologic: Stage IIA (pT3, pN0, cM0) - Signed by Burton, Lacie K, NP on 12/26/2023 Stage prefix: Initial diagnosis Total positive nodes: 0 Histologic grading system: 4 grade system Histologic grade (G): G2      Discussed the use of AI scribe software for clinical note transcription with the patient, who gave verbal consent to proceed.  History of Present Illness Leah Hernandez is a 63 year old female with colon cancer and nutritional anemia who presents for follow-up.  She is not taking oral B12 or iron . She received IV iron  in June. Recent labs show persistent mild anemia with hemoglobin falling from 13.7 two months ago to 11.7 last week. She was advised to use over-the-counter iron  or a prenatal vitamin with iron  and B12.  Her colon cancer was diagnosed in May 2025 as stage 2, and she did not receive chemotherapy. She is on active surveillance with colonoscopy planned for May 2026. Her recent tumor marker was normal. She is awaiting results of a Guardian blood test for circulating tumor cells. A CT scan is scheduled for July 17, 2024.  She has chronic kidney disease stage 3A with fluctuating renal function. Creatinine last week was 1.3, improved from a prior high of 1.8.  She is working  on weight management with a low-carb diet focused on lean meats and vegetables and walks at home for exercise.  She has not had a recent mammogram due to transportation barriers but intends to schedule one.     All other systems were reviewed with the patient and are  negative.  MEDICAL HISTORY:  Past Medical History:  Diagnosis Date   Prediabetes     SURGICAL HISTORY: Past Surgical History:  Procedure Laterality Date   BIOPSY OF SKIN SUBCUTANEOUS TISSUE AND/OR MUCOUS MEMBRANE  12/14/2023   Procedure: BIOPSY, SKIN, SUBCUTANEOUS TISSUE, OR MUCOUS MEMBRANE;  Surgeon: Suzann Inocente HERO, MD;  Location: [facility] ENDOSCOPY;  Service: Gastroenterology;;   COLONOSCOPY N/A 12/14/2023   Procedure: COLONOSCOPY;  Surgeon: Suzann Inocente HERO, MD;  Location: [facility] ENDOSCOPY;  Service: Gastroenterology;  Laterality: N/A;   ESOPHAGOGASTRODUODENOSCOPY N/A 12/14/2023   Procedure: EGD (ESOPHAGOGASTRODUODENOSCOPY);  Surgeon: Suzann Inocente HERO, MD;  Location: [facility] ENDOSCOPY;  Service: Gastroenterology;  Laterality: N/A;   LAPAROSCOPIC PARTIAL COLECTOMY Right 12/18/2023   Procedure: LAPAROSCOPIC assisted  COLECTOMY;  Surgeon: Polly Cordella LABOR, MD;  Location: [facility] OR;  Service: General;  Laterality: Right;   POLYPECTOMY  12/14/2023   Procedure: POLYPECTOMY, INTESTINE;  Surgeon: Suzann Inocente HERO, MD;  Location: [facility] ENDOSCOPY;  Service: Gastroenterology;;   RIGHT/LEFT HEART CATH AND CORONARY ANGIOGRAPHY N/A 12/17/2023   Procedure: RIGHT/LEFT HEART CATH AND CORONARY ANGIOGRAPHY;  Surgeon: Mady Bruckner, MD;  Location: [facility] INVASIVE CV LAB;  Service: Cardiovascular;  Laterality: N/A;   SCLEROTHERAPY  12/14/2023   Procedure: SCLEROTHERAPY;  Surgeon: Suzann Inocente HERO, MD;  Location: [facility] ENDOSCOPY;  Service: Gastroenterology;;   TUBAL LIGATION      I have reviewed the social history and family history with the patient and they are unchanged from previous note.  ALLERGIES:  has no known allergies.  MEDICATIONS:  Current Outpatient Medications  Medication Sig Dispense Refill   acetaminophen  (TYLENOL ) 325 MG tablet Take 325-650 mg by mouth every 6 (six) hours as needed for mild pain (pain score 1-3) or moderate pain (pain score 4-6).     FARXIGA  10 MG TABS tablet Take 1 tablet (10 mg total) by mouth  daily. 90 tablet 1   furosemide  (LASIX ) 40 MG tablet Take 1 tablet (40 mg total) by mouth daily as needed for fluid or edema (Weight gain >3 lbs in a day or >5 lbs in one week). 30 tablet 5   methocarbamol  (ROBAXIN ) 500 MG tablet Take 500 mg by mouth 3 (three) times daily.     metoprolol  succinate (TOPROL -XL) 100 MG 24 hr tablet Take 1.5 tablets (150 mg total) by mouth daily. 90 tablet 5   polyethylene glycol powder (GLYCOLAX /MIRALAX ) 17 GM/SCOOP powder Take 17 g by mouth daily. 238 g 0   sacubitril -valsartan  (ENTRESTO ) 24-26 MG Take 1 tablet by mouth 2 (two) times daily. 60 tablet 2   spironolactone  (ALDACTONE ) 25 MG tablet Take 1 tablet (25 mg total) by mouth daily. 30 tablet 5   No current facility-administered medications for this visit.    PHYSICAL EXAMINATION: ECOG PERFORMANCE STATUS: 0 - Asymptomatic  Vitals:   07/07/24 0951  BP: 106/63  Pulse: 77  Resp: 17  Temp: (!) 96.9 F (36.1 C)  SpO2: 100%   Wt Readings from Last 3 Encounters:  07/07/24 222 lb 1.6 oz (100.7 kg)  06/17/24 214 lb 9.6 oz (97.3 kg)  06/02/24 212 lb 6.4 oz (96.3 kg)     GENERAL:alert, no distress and comfortable SKIN: skin color, texture, turgor are normal, no  rashes or significant lesions EYES: normal, Conjunctiva are pink and non-injected, sclera clear NECK: supple, thyroid normal size, non-tender, without nodularity LYMPH:  no palpable lymphadenopathy in the cervical, axillary  LUNGS: clear to auscultation and percussion with normal breathing effort HEART: regular rate & rhythm and no murmurs and no lower extremity edema ABDOMEN:abdomen soft, non-tender and normal bowel sounds Musculoskeletal:no cyanosis of digits and no clubbing  NEURO: alert & oriented x 3 with fluent speech, no focal motor/sensory deficits  Physical Exam    LABORATORY DATA:  I have reviewed the data as listed    Latest Ref Rng & Units 06/30/2024   10:04 AM 04/18/2024   10:07 AM 01/02/2024    9:38 AM  CBC  WBC 4.0 - 10.5  K/uL 4.6  3.4  3.9   Hemoglobin 12.0 - 15.0 g/dL 88.2  86.2  9.3   Hematocrit 36.0 - 46.0 % 36.2  40.9  30.1   Platelets 150 - 400 K/uL 202  208  339         Latest Ref Rng & Units 06/30/2024   10:04 AM 06/17/2024   10:17 AM 06/02/2024    1:45 PM  CMP  Glucose 70 - 99 mg/dL 88  80  882   BUN 8 - 23 mg/dL 24  40  43   Creatinine 0.44 - 1.00 mg/dL 8.63  8.27  8.17   Sodium 135 - 145 mmol/L 138  138  137   Potassium 3.5 - 5.1 mmol/L 4.9  5.2  5.2   Chloride 98 - 111 mmol/L 107  103  102   CO2 22 - 32 mmol/L 23  25  24    Calcium 8.9 - 10.3 mg/dL 9.7  9.7  9.3   Total Protein 6.5 - 8.1 g/dL 7.5     Total Bilirubin 0.0 - 1.2 mg/dL 0.6     Alkaline Phos 38 - 126 U/L 69     AST 15 - 41 U/L 21     ALT 0 - 44 U/L 16         RADIOGRAPHIC STUDIES: I have personally reviewed the radiological images as listed and agreed with the findings in the report. No results found.    No orders of the defined types were placed in this encounter.  All questions were answered. The patient knows to call the clinic with any problems, questions or concerns. No barriers to learning was detected. The total time spent in the appointment was 20 minutes, including review of chart and various tests results, discussions about plan of care and coordination of care plan     Onita Mattock, MD 07/07/2024

## 2024-07-08 ENCOUNTER — Telehealth: Payer: Self-pay | Admitting: Hematology

## 2024-07-08 NOTE — Telephone Encounter (Signed)
 LVM  regarding appts and time and date.

## 2024-07-09 ENCOUNTER — Encounter (HOSPITAL_COMMUNITY)
Admission: RE | Admit: 2024-07-09 | Discharge: 2024-07-09 | Disposition: A | Discharge status: Home / Self Care | Source: Ambulatory Visit | Attending: Cardiology | Admitting: Cardiology

## 2024-07-09 DIAGNOSIS — I5022 Chronic systolic (congestive) heart failure: Secondary | ICD-10-CM | POA: Diagnosis not present

## 2024-07-11 ENCOUNTER — Encounter (HOSPITAL_COMMUNITY)
Admission: RE | Admit: 2024-07-11 | Discharge: 2024-07-11 | Disposition: A | Source: Ambulatory Visit | Attending: Cardiology

## 2024-07-11 DIAGNOSIS — I5022 Chronic systolic (congestive) heart failure: Secondary | ICD-10-CM | POA: Diagnosis not present

## 2024-07-14 ENCOUNTER — Encounter (HOSPITAL_COMMUNITY)
Admission: RE | Admit: 2024-07-14 | Discharge: 2024-07-14 | Disposition: A | Source: Ambulatory Visit | Attending: Cardiology

## 2024-07-14 DIAGNOSIS — I5022 Chronic systolic (congestive) heart failure: Secondary | ICD-10-CM

## 2024-07-15 ENCOUNTER — Encounter: Payer: Self-pay | Admitting: Hematology

## 2024-07-15 LAB — GUARDANT REVEAL

## 2024-07-16 ENCOUNTER — Other Ambulatory Visit: Payer: Self-pay

## 2024-07-16 ENCOUNTER — Encounter (HOSPITAL_COMMUNITY): Admission: RE | Admit: 2024-07-16 | Discharge: 2024-07-16 | Attending: Cardiology

## 2024-07-16 DIAGNOSIS — I5022 Chronic systolic (congestive) heart failure: Secondary | ICD-10-CM

## 2024-07-16 NOTE — Progress Notes (Signed)
 As per Dr. Lanny, Called patient to relay the patient Guardant Reveal test results, results were negative, patient voiced full understanding and had no further questions at this time.

## 2024-07-17 ENCOUNTER — Ambulatory Visit (HOSPITAL_COMMUNITY)
Admission: RE | Admit: 2024-07-17 | Discharge: 2024-07-17 | Disposition: A | Discharge status: Home / Self Care | Source: Ambulatory Visit | Attending: Nurse Practitioner | Admitting: Nurse Practitioner

## 2024-07-17 DIAGNOSIS — C183 Malignant neoplasm of hepatic flexure: Secondary | ICD-10-CM | POA: Diagnosis present

## 2024-07-17 DIAGNOSIS — C189 Malignant neoplasm of colon, unspecified: Secondary | ICD-10-CM | POA: Diagnosis not present

## 2024-07-17 DIAGNOSIS — K5730 Diverticulosis of large intestine without perforation or abscess without bleeding: Secondary | ICD-10-CM | POA: Diagnosis not present

## 2024-07-17 DIAGNOSIS — N8110 Cystocele, unspecified: Secondary | ICD-10-CM | POA: Diagnosis not present

## 2024-07-17 MED ORDER — IOHEXOL 300 MG/ML  SOLN
100.0000 mL | Freq: Once | INTRAMUSCULAR | Status: AC | PRN
  Administered 2024-07-17: 16:00:00 100 mL via INTRAVENOUS

## 2024-07-17 MED ORDER — IOHEXOL 9 MG/ML PO SOLN
1000.0000 mL | ORAL | Status: AC
  Administered 2024-07-17: 14:00:00 1000 mL via ORAL

## 2024-07-18 ENCOUNTER — Telehealth (HOSPITAL_COMMUNITY): Payer: Self-pay

## 2024-07-18 ENCOUNTER — Encounter (HOSPITAL_COMMUNITY): Admission: RE | Admit: 2024-07-18 | Source: Ambulatory Visit

## 2024-07-18 NOTE — Telephone Encounter (Signed)
 Patient c/o for 10:15 CR class, states she is out of town.

## 2024-07-20 NOTE — Assessment & Plan Note (Signed)
 G2, pT3 N0 M0, stage IIA, MMR normal -Presented with IDA, 30 lbs unintentional weight loss, constipation with rectal bleeding.  -EGD/Colonoscopy by Dr. Suzann 12/14/23 showing gastritis and small hernia as well as a malignant appearing partially obstructing 7 cm tumor in the transverse colon. Path confirmed adenocarcinoma. S/p surgical resection on 12/18/2023. -continue surveillance. CT and GuardantReveal in early Dec 2025 were negative.

## 2024-07-21 ENCOUNTER — Inpatient Hospital Stay: Admitting: Hematology

## 2024-07-21 ENCOUNTER — Encounter (HOSPITAL_COMMUNITY)

## 2024-07-21 DIAGNOSIS — N83202 Unspecified ovarian cyst, left side: Secondary | ICD-10-CM | POA: Diagnosis not present

## 2024-07-21 DIAGNOSIS — C183 Malignant neoplasm of hepatic flexure: Secondary | ICD-10-CM

## 2024-07-21 NOTE — Progress Notes (Signed)
 " [facility]   [phone] Fax:([phone]   Clinic Follow up Note   Patient Care Team: Theotis Haze ORN, NP as PCP - General (Nurse Practitioner) Lonni Slain, MD as PCP - Cardiology (Cardiology) 07/21/2024  I connected with Leah Hernandez on 07/21/2024 at  8:20 AM EST by telephone and verified that I am speaking with the correct person using two identifiers.   I discussed the limitations, risks, security and privacy concerns of performing an evaluation and management service by telephone and the availability of in person appointments. I also discussed with the patient that there may be a patient responsible charge related to this service. The patient expressed understanding and agreed to proceed.   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  -EGD/Colonoscopy by Dr. Suzann 12/14/23 showing gastritis and small hernia as well as a malignant appearing partially obstructing 7 cm tumor in the transverse colon. Path confirmed adenocarcinoma. S/p surgical resection on 12/18/2023. -continue surveillance. CT and GuardantReveal in early Dec 2025 were negative.   Assessment & Plan Colon cancer  -status post resection of primary cancer of hepatic flexure -She remains asymptomatic without evidence of colon cancer recurrence on recent surveillance CT. No new concerns related to her prior colon cancer. - Reviewed surveillance CT images and discussed the results with her, confirming no evidence of recurrence. - Scheduled follow-up phone call after upcoming imaging and laboratory studies.  Enlarging left ovarian cyst She has an enlarging left ovarian cyst, increasing from 3.3  cm to approximately 5 cm on recent CT. She is asymptomatic, with no pelvic pain or vaginal bleeding. The lesion is likely benign, but interval growth warrants further evaluation. There is no high suspicion for ovarian malignancy, but additional imaging and tumor marker assessment are indicated to exclude malignancy. - Ordered pelvic MRI to further characterize the left ovarian cyst. - Ordered serum ovarian tumor markers to assess for malignancy.  Plan -I personally reviewed her surveillance CT scan images and discussed findings with her.  - Scheduled MRI and laboratory studies to be performed on the same day after the holidays (in approximately two weeks) to evaluate her enlarging left ovarian cyst. - Scheduled follow-up phone call to review results after imaging and laboratory studies.     SUMMARY OF ONCOLOGIC HISTORY: Oncology History  Primary cancer of hepatic flexure of colon (HCC)  12/14/2023 Initial Diagnosis   Primary cancer of hepatic flexure of colon (HCC)   12/26/2023 Cancer Staging   Staging form: Colon and Rectum, AJCC 8th Edition - Pathologic: Stage IIA (pT3, pN0, cM0) - Signed by Burton, Lacie K, NP on 12/26/2023 Stage prefix: Initial diagnosis Total positive nodes: 0 Histologic grading system: 4 grade system Histologic grade (G): G2     Discussed the use of AI scribe software for clinical note transcription with the patient, who gave verbal consent to proceed.  History of Present Illness Leah Hernandez is a 63 year old female with malignant neoplasm of the hepatic flexure, status post resection, who presents for evaluation of an enlarging left ovarian cyst.  She had surveillance CT imaging last week for colon cancer follow-up and is asymptomatic, with no abdominal or pelvic pain or vaginal bleeding.  Imaging showed a left  ovarian cystic lesion that increased from 3.3 cm to about 5 cm since prior studies. She denies pain or abnormal bleeding, has no gynecologist, and  has had no new symptoms since her last visit.     REVIEW OF SYSTEMS:   Constitutional: Denies fevers, chills or abnormal weight loss Eyes: Denies blurriness of vision Ears, nose, mouth, throat, and face: Denies mucositis or sore throat Respiratory: Denies cough, dyspnea or wheezes Cardiovascular: Denies palpitation, chest discomfort or lower extremity swelling Gastrointestinal:  Denies nausea, heartburn or change in bowel habits Skin: Denies abnormal skin rashes Lymphatics: Denies new lymphadenopathy or easy bruising Neurological:Denies numbness, tingling or new weaknesses Behavioral/Psych: Mood is stable, no new changes  All other systems were reviewed with the patient and are negative.  MEDICAL HISTORY:  Past Medical History:  Diagnosis Date   Prediabetes     SURGICAL HISTORY: Past Surgical History:  Procedure Laterality Date   BIOPSY OF SKIN SUBCUTANEOUS TISSUE AND/OR MUCOUS MEMBRANE  12/14/2023   Procedure: BIOPSY, SKIN, SUBCUTANEOUS TISSUE, OR MUCOUS MEMBRANE;  Surgeon: Suzann Inocente HERO, MD;  Location: [facility] ENDOSCOPY;  Service: Gastroenterology;;   COLONOSCOPY N/A 12/14/2023   Procedure: COLONOSCOPY;  Surgeon: Suzann Inocente HERO, MD;  Location: [facility] ENDOSCOPY;  Service: Gastroenterology;  Laterality: N/A;   ESOPHAGOGASTRODUODENOSCOPY N/A 12/14/2023   Procedure: EGD (ESOPHAGOGASTRODUODENOSCOPY);  Surgeon: Suzann Inocente HERO, MD;  Location: [facility] ENDOSCOPY;  Service: Gastroenterology;  Laterality: N/A;   LAPAROSCOPIC PARTIAL COLECTOMY Right 12/18/2023   Procedure: LAPAROSCOPIC assisted  COLECTOMY;  Surgeon: Polly Cordella LABOR, MD;  Location: [facility] OR;  Service: General;  Laterality: Right;   POLYPECTOMY  12/14/2023   Procedure: POLYPECTOMY, INTESTINE;  Surgeon: Suzann Inocente HERO, MD;  Location: [facility] ENDOSCOPY;  Service: Gastroenterology;;   RIGHT/LEFT HEART CATH AND CORONARY ANGIOGRAPHY N/A 12/17/2023   Procedure: RIGHT/LEFT HEART CATH AND CORONARY ANGIOGRAPHY;  Surgeon: Mady Bruckner, MD;   Location: [facility] INVASIVE CV LAB;  Service: Cardiovascular;  Laterality: N/A;   SCLEROTHERAPY  12/14/2023   Procedure: SCLEROTHERAPY;  Surgeon: Suzann Inocente HERO, MD;  Location: [facility] ENDOSCOPY;  Service: Gastroenterology;;   TUBAL LIGATION      I have reviewed the social history and family history with the patient and they are unchanged from previous note.  ALLERGIES:  has no known allergies.  MEDICATIONS:  Current Outpatient Medications  Medication Sig Dispense Refill   acetaminophen  (TYLENOL ) 325 MG tablet Take 325-650 mg by mouth every 6 (six) hours as needed for mild pain (pain score 1-3) or moderate pain (pain score 4-6).     FARXIGA  10 MG TABS tablet Take 1 tablet (10 mg total) by mouth daily. 90 tablet 1   furosemide  (LASIX ) 40 MG tablet Take 1 tablet (40 mg total) by mouth daily as needed for fluid or edema (Weight gain >3 lbs in a day or >5 lbs in one week). 30 tablet 5   methocarbamol  (ROBAXIN ) 500 MG tablet Take 500 mg by mouth 3 (three) times daily.     metoprolol  succinate (TOPROL -XL) 100 MG 24 hr tablet Take 1.5 tablets (150 mg total) by mouth daily. 90 tablet 5   polyethylene glycol powder (GLYCOLAX /MIRALAX ) 17 GM/SCOOP powder Take 17 g by mouth daily. 238 g 0   sacubitril -valsartan  (ENTRESTO ) 24-26 MG Take 1 tablet by mouth 2 (two) times daily. 60 tablet 2   spironolactone  (ALDACTONE ) 25 MG tablet Take 1 tablet (25 mg total) by mouth daily. 30 tablet 5   No current facility-administered medications for this visit.    PHYSICAL EXAMINATION: Not performed  LABORATORY DATA:  I have reviewed the data as listed    Latest Ref Rng & Units 06/30/2024   10:04 AM 04/18/2024   10:07 AM 01/02/2024    9:38 AM  CBC  WBC 4.0 - 10.5 K/uL 4.6  3.4  3.9   Hemoglobin 12.0 - 15.0 g/dL 88.2  86.2  9.3   Hematocrit 36.0 - 46.0 % 36.2  40.9  30.1   Platelets 150 - 400 K/uL 202  208  339         Latest Ref Rng & Units 06/30/2024   10:04 AM 06/17/2024   10:17 AM 06/02/2024    1:45 PM  CMP   Glucose 70 - 99 mg/dL 88  80  882   BUN 8 - 23 mg/dL 24  40  43   Creatinine 0.44 - 1.00 mg/dL 8.63  8.27  8.17   Sodium 135 - 145 mmol/L 138  138  137   Potassium 3.5 - 5.1 mmol/L 4.9  5.2  5.2   Chloride 98 - 111 mmol/L 107  103  102   CO2 22 - 32 mmol/L 23  25  24    Calcium 8.9 - 10.3 mg/dL 9.7  9.7  9.3   Total Protein 6.5 - 8.1 g/dL 7.5     Total Bilirubin 0.0 - 1.2 mg/dL 0.6     Alkaline Phos 38 - 126 U/L 69     AST 15 - 41 U/L 21     ALT 0 - 44 U/L 16         RADIOGRAPHIC STUDIES: I have personally reviewed the radiological images as listed and agreed with the findings in the report. No results found.     I discussed the assessment and treatment plan with the patient. The patient was provided an opportunity to ask questions and all were answered. The patient agreed with the plan and demonstrated an understanding of the instructions.   The patient was advised to call back or seek an in-person evaluation if the symptoms worsen or if the condition fails to improve as anticipated.  I provided 25 minutes of non face-to-face telephone visit time during this encounter, including review of chart and various tests results, discussions about plan of care and coordination of care plan.    Onita Mattock, MD 07/21/2024     "

## 2024-07-22 ENCOUNTER — Other Ambulatory Visit: Payer: Self-pay

## 2024-07-23 ENCOUNTER — Encounter (HOSPITAL_COMMUNITY)
Admission: RE | Admit: 2024-07-23 | Discharge: 2024-07-23 | Disposition: A | Source: Ambulatory Visit | Attending: Cardiology

## 2024-07-23 DIAGNOSIS — I5022 Chronic systolic (congestive) heart failure: Secondary | ICD-10-CM

## 2024-07-25 ENCOUNTER — Encounter (HOSPITAL_COMMUNITY)

## 2024-07-28 ENCOUNTER — Other Ambulatory Visit: Payer: Self-pay

## 2024-07-28 ENCOUNTER — Encounter (HOSPITAL_COMMUNITY): Admission: RE | Admit: 2024-07-28

## 2024-07-28 DIAGNOSIS — I5022 Chronic systolic (congestive) heart failure: Secondary | ICD-10-CM

## 2024-07-29 NOTE — Progress Notes (Signed)
 Cardiac Individual Treatment Plan  Patient Details  Name: Leah Hernandez MRN: [id number] Date of Birth: 1960-09-01 Referring Provider:   Flowsheet Row CARDIAC REHAB PHASE II ORIENTATION from 05/15/2024 in [facility]  Referring Provider Leah Brownie, MD    Initial Encounter Date:  Flowsheet Row CARDIAC REHAB PHASE II ORIENTATION from 05/15/2024 in [facility]  Date 05/15/24    Visit Diagnosis: Heart failure, chronic systolic (HCC)  Patient's Home Medications on Admission: Current Medications[1]  Past Medical History: Past Medical History:  Diagnosis Date   Prediabetes     Tobacco Use: Tobacco Use History[2]  Labs: Review Flowsheet       Latest Ref Rng & Units 03/27/2017 09/29/2019 12/16/2023 12/17/2023  Labs for ITP Cardiac and Pulmonary Rehab  Cholestrol 100 - 199 mg/dL - 789  - -  LDL (calc) 0 - 99 mg/dL - 81  - -  HDL-C >60 mg/dL - 884  - -  Trlycerides 0 - 149 mg/dL - 81  - -  Hemoglobin J8r 4.8 - 5.6 % 5.7  5.7  5.4  5.2   PH, Arterial 7.35 - 7.45 - - - 7.400   PCO2 arterial 32 - 48 mmHg - - - 34.2   Bicarbonate 20.0 - 28.0 mmol/L - - - 21.2  21.8  21.9   TCO2 22 - 32 mmol/L - - - 22  23  23    Acid-base deficit 0.0 - 2.0 mmol/L - - - 3.0  3.0  3.0   O2 Saturation % - - - 96  66  65     Details       Multiple values from one day are sorted in reverse-chronological order         Capillary Blood Glucose: Lab Results  Component Value Date   GLUCAP 90 05/21/2024   GLUCAP 91 05/21/2024   GLUCAP 84 12/17/2023   GLUCAP 85 12/14/2023     Exercise Target Goals: Exercise Program Goal: Individual exercise prescription set using results from initial 6 min walk test and THRR while considering  patients activity barriers and safety.   Exercise Prescription Goal: Initial exercise prescription builds to 30-45 minutes a day of aerobic  activity, 2-3 days per week.  Home exercise guidelines will be given to patient during program as part of exercise prescription that the participant will acknowledge.  Activity Barriers & Risk Stratification:  Activity Barriers & Cardiac Risk Stratification - 05/15/24 1519       Activity Barriers & Cardiac Risk Stratification   Activity Barriers Balance Concerns;Joint Problems;Arthritis;Back Problems;Deconditioning;Decreased Ventricular Function    Cardiac Risk Stratification High          6 Minute Walk:  6 Minute Walk     Row Name 05/15/24 1159         6 Minute Walk   Phase Initial     Distance 1010 feet     Walk Time 6 minutes     # of Rest Breaks 0     MPH 1.91     METS 2.22     RPE 11     Perceived Dyspnea  0     VO2 Peak 7.8     Symptoms No     Resting HR 73 bpm     Resting BP 104/62     Resting Oxygen Saturation  99 %     Exercise Oxygen Saturation  during  6 min walk 99 %     Max Ex. HR 88 bpm     Max Ex. BP 122/80     2 Minute Post BP 110/70        Oxygen Initial Assessment:   Oxygen Re-Evaluation:   Oxygen Discharge (Final Oxygen Re-Evaluation):   Initial Exercise Prescription:  Initial Exercise Prescription - 05/15/24 1500       Date of Initial Exercise RX and Referring Provider   Date 05/15/24    Referring Provider Leah Brownie, MD    Expected Discharge Date 08/06/24      NuStep   Level 1    SPM 70    Minutes 15    METs 1.8      Track   Laps 10    Minutes 15    METs 2.5      Prescription Details   Frequency (times per week) 3    Duration Progress to 30 minutes of continuous aerobic without signs/symptoms of physical distress      Intensity   THRR 40-80% of Max Heartrate 63-126    Ratings of Perceived Exertion 11-13    Perceived Dyspnea 0-4      Progression   Progression Continue progressive overload as per policy without signs/symptoms or physical distress.      Resistance Training   Training Prescription Yes    Reps  10-15          Perform Capillary Blood Glucose checks as needed.  Exercise Prescription Changes:   Exercise Prescription Changes     Row Name 05/21/24 1021 06/02/24 1034 06/16/24 1031 06/18/24 1033 06/23/24 1034     Response to Exercise   Blood Pressure (Admit) 112/78 98/60 94/60  100/62 118/66   Blood Pressure (Exercise) 116/60 -- 110/66 -- 90/46   Blood Pressure (Exit) 104/72 98/64 96/68  110/70 107/70   Heart Rate (Admit) 74 bpm 82 bpm 78 bpm 80 bpm 80 bpm   Heart Rate (Exercise) 111 bpm 116 bpm 113 bpm 120 bpm 119 bpm   Heart Rate (Exit) 83 bpm 86 bpm 85 bpm 89 bpm 91 bpm   Rating of Perceived Exertion (Exercise) 11 11 9 11 11    Symptoms None None None None None   Comments Off to a good start with exercise. -- -- Reviewed home exercise guidelines and goals with Leah Hernandez. --   Duration Continue with 30 min of aerobic exercise without signs/symptoms of physical distress. Continue with 30 min of aerobic exercise without signs/symptoms of physical distress. Continue with 30 min of aerobic exercise without signs/symptoms of physical distress. Continue with 30 min of aerobic exercise without signs/symptoms of physical distress. Continue with 30 min of aerobic exercise without signs/symptoms of physical distress.   Intensity THRR unchanged THRR unchanged THRR unchanged THRR unchanged THRR unchanged     Progression   Progression Continue to progress workloads to maintain intensity without signs/symptoms of physical distress. Continue to progress workloads to maintain intensity without signs/symptoms of physical distress. Continue to progress workloads to maintain intensity without signs/symptoms of physical distress. Continue to progress workloads to maintain intensity without signs/symptoms of physical distress. Continue to progress workloads to maintain intensity without signs/symptoms of physical distress.   Average METs 2.7 2.5 2.6 2.5 2.9     Resistance Training   Training Prescription  No Yes Yes No Yes   Weight Relaxation day, no weights 2 lbs 2 lbs Relaxation day. No weights. 3 lbs   Reps -- 10-15 10-15 -- 10-15   Time --  5 Minutes 5 Minutes -- 5 Minutes     Interval Training   Interval Training No No No No No     NuStep   Level 1 1 1 2 2    SPM 54 74 78 65 81   Minutes 15 15 15 15 15    METs 1.7 1.9 1.9 1.8 2     Track   Laps 22 16 18 17 22    Minutes 15 15 15 15 15    METs 3.81 3.04 3.3 3.17 3.81     Home Exercise Plan   Plans to continue exercise at -- -- -- Home (comment)  Walking Home (comment)  Walking   Frequency -- -- -- Add 3 additional days to program exercise sessions. Add 3 additional days to program exercise sessions.   Initial Home Exercises Provided -- -- -- 06/18/24 06/18/24    Row Name 07/09/24 1029 07/14/24 1026 07/23/24 1026         Response to Exercise   Blood Pressure (Admit) 100/60 98/60 108/60     Blood Pressure (Exit) 92/68 102/70 98/70     Heart Rate (Admit) 68 bpm 79 bpm 77 bpm     Heart Rate (Exercise) 105 bpm 114 bpm 119 bpm     Heart Rate (Exit) 77 bpm 88 bpm 85 bpm     Rating of Perceived Exertion (Exercise) 9 8 9      Symptoms None None None     Comments Reviewed goals with Leah Hernandez. Increased workload on recumbent stepper. -- --     Duration Continue with 30 min of aerobic exercise without signs/symptoms of physical distress. Continue with 30 min of aerobic exercise without signs/symptoms of physical distress. Continue with 30 min of aerobic exercise without signs/symptoms of physical distress.     Intensity THRR unchanged THRR unchanged THRR unchanged       Progression   Progression Continue to progress workloads to maintain intensity without signs/symptoms of physical distress. Continue to progress workloads to maintain intensity without signs/symptoms of physical distress. Continue to progress workloads to maintain intensity without signs/symptoms of physical distress.     Average METs 2.6 2.7 2.7       Resistance Training    Training Prescription No -- No     Weight relaxation day. No weights. 3 lbs Relaxation day. No weights.     Reps -- 10-15 --     Time -- 5 Minutes --       Interval Training   Interval Training No No No       NuStep   Level 3 3 3      SPM 81 88 90     Minutes 15 15 15      METs 2.1 2.2 2.3       Track   Laps 17 18 17      Minutes 15 15 15      METs 3.17 3.3 3.17       Home Exercise Plan   Plans to continue exercise at Home (comment)  Walking Home (comment)  Walking Home (comment)  Walking     Frequency Add 3 additional days to program exercise sessions. Add 3 additional days to program exercise sessions. Add 3 additional days to program exercise sessions.     Initial Home Exercises Provided 06/18/24 06/18/24 06/18/24        Exercise Comments:   Exercise Comments     Row Name 05/21/24 1128 06/18/24 1108 07/09/24 1106       Exercise Comments Peg tolerated  low intensity exercise well without symptoms. Oriented to the exercise equipment and stretching routine. Reviewed home exercise guidelines and goals with Leah Hernandez. Reviewed goals with Leah Hernandez.        Exercise Goals and Review:   Exercise Goals     Row Name 05/15/24 1521             Exercise Goals   Increase Physical Activity Yes       Intervention Provide advice, education, support and counseling about physical activity/exercise needs.;Develop an individualized exercise prescription for aerobic and resistive training based on initial evaluation findings, risk stratification, comorbidities and participant's personal goals.       Expected Outcomes Short Term: Attend rehab on a regular basis to increase amount of physical activity.;Long Term: Exercising regularly at least 3-5 days a week.;Long Term: Add in home exercise to make exercise part of routine and to increase amount of physical activity.       Increase Strength and Stamina Yes       Intervention Provide advice, education, support and counseling about  physical activity/exercise needs.;Develop an individualized exercise prescription for aerobic and resistive training based on initial evaluation findings, risk stratification, comorbidities and participant's personal goals.       Expected Outcomes Short Term: Increase workloads from initial exercise prescription for resistance, speed, and METs.;Short Term: Perform resistance training exercises routinely during rehab and add in resistance training at home;Long Term: Improve cardiorespiratory fitness, muscular endurance and strength as measured by increased METs and functional capacity ( )       Able to understand and use rate of perceived exertion (RPE) scale Yes       Intervention Provide education and explanation on how to use RPE scale       Expected Outcomes Short Term: Able to use RPE daily in rehab to express subjective intensity level;Long Term:  Able to use RPE to guide intensity level when exercising independently       Knowledge and understanding of Target Heart Rate Range (THRR) Yes       Intervention Provide education and explanation of THRR including how the numbers were predicted and where they are located for reference       Expected Outcomes Short Term: Able to state/look up THRR;Long Term: Able to use THRR to govern intensity when exercising independently;Short Term: Able to use daily as guideline for intensity in rehab       Understanding of Exercise Prescription Yes       Intervention Provide education, explanation, and written materials on patient's individual exercise prescription       Expected Outcomes Short Term: Able to explain program exercise prescription;Long Term: Able to explain home exercise prescription to exercise independently          Exercise Goals Re-Evaluation :  Exercise Goals Re-Evaluation     Row Name 05/21/24 1128 06/18/24 1108 07/09/24 1106         Exercise Goal Re-Evaluation   Exercise Goals Review Increase Physical Activity;Increase Strength and  Stamina;Able to understand and use rate of perceived exertion (RPE) scale Increase Physical Activity;Increase Strength and Stamina;Able to understand and use rate of perceived exertion (RPE) scale;Understanding of Exercise Prescription;Knowledge and understanding of Target Heart Rate Range (THRR) Increase Physical Activity;Increase Strength and Stamina;Able to understand and use rate of perceived exertion (RPE) scale;Understanding of Exercise Prescription;Knowledge and understanding of Target Heart Rate Range (THRR)     Comments Sharlet is able to understand and use RPE scale appropriately. Reviewed exercise prescription with Leah Hernandez. She  is walking 3 days/week. Her goal is to stay healthy and lose weight. Will increase hand weights from 2 to 3 lbs next session Deaira feels like she's where she needs to be with exercise. Increased workload on the recumbent stepper today.     Expected Outcomes Progress workloads as tolerated to help improve cardiorespiratory fitness. Patrici will walk 20-30 minutes 3 days/week to help achieve health and fitness goals. Continue to progress workloads as tolerated.        Discharge Exercise Prescription (Final Exercise Prescription Changes):  Exercise Prescription Changes - 07/23/24 1026       Response to Exercise   Blood Pressure (Admit) 108/60    Blood Pressure (Exit) 98/70    Heart Rate (Admit) 77 bpm    Heart Rate (Exercise) 119 bpm    Heart Rate (Exit) 85 bpm    Rating of Perceived Exertion (Exercise) 9    Symptoms None    Duration Continue with 30 min of aerobic exercise without signs/symptoms of physical distress.    Intensity THRR unchanged      Progression   Progression Continue to progress workloads to maintain intensity without signs/symptoms of physical distress.    Average METs 2.7      Resistance Training   Training Prescription No    Weight Relaxation day. No weights.      Interval Training   Interval Training No      NuStep   Level 3     SPM 90    Minutes 15    METs 2.3      Track   Laps 17    Minutes 15    METs 3.17      Home Exercise Plan   Plans to continue exercise at Home (comment)   Walking   Frequency Add 3 additional days to program exercise sessions.    Initial Home Exercises Provided 06/18/24          Nutrition:  Target Goals: Understanding of nutrition guidelines, daily intake of sodium 1500mg , cholesterol 200mg , calories 30% from fat and 7% or less from saturated fats, daily to have 5 or more servings of fruits and vegetables.  Biometrics:  Pre Biometrics - 05/15/24 1100       Pre Biometrics   Waist Circumference 41 inches    Hip Circumference 50 inches    Waist to Hip Ratio 0.82 %    Triceps Skinfold 31 mm    % Body Fat 44.2 %    Grip Strength 32 kg    Flexibility 16 in    Single Leg Stand 10 seconds           Nutrition Therapy Plan and Nutrition Goals:  Nutrition Therapy & Goals - 07/04/24 1511       Nutrition Therapy   Diet Heart Healthy      Personal Nutrition Goals   Nutrition Goal Patient to improve diet quality by using the plate method as a guide for meal planning to include lean protein/plant protein, fruits, vegetables, whole grains, nonfat dairy as part of a well-balanced diet.    Personal Goal #2 Patient to limit sodium intake to <2300 mg per day.    Personal Goal #3 Patient to identify strategies for weight loss with goal of 0.5-2 # per week of weight loss.    Comments Patient with past medical history significant for chronic systolic heart failure, colon cancer s/p hemicolectomy, chronic kidney disease, and prediabetes. Nutritionally pertinent labs on 06/30/24 include K 4.9, BUN 24, Cr  1.36, eGFR 44. Most recent A1c on 05/14/24 6.2%. Recent wt gain of 1.6 kg (1.7%) noted over past month. Pt attributes recent wt gain to snacks which include chips and popcorn. Reports avoiding fried foods; typically has baked chicken at meals. RD provided healthy snacks ideas including  lower caloric foods such as fruit/veggies. Encouraged pt to opt for lower potassium fruit/veggies given borderline hyperkalemia. Patient will benefit from participation in intensive cardiac rehab for nutrition education, exercise, and lifestyle modification.      Intervention Plan   Intervention Prescribe, educate and counsel regarding individualized specific dietary modifications aiming towards targeted core components such as weight, hypertension, lipid management, diabetes, heart failure and other comorbidities.;Nutrition handout(s) given to patient.   Handouts: Heart Failure Nutrition Therapy, Potassium Content of Foods   Expected Outcomes Short Term Goal: Understand basic principles of dietary content, such as calories, fat, sodium, cholesterol and nutrients.;Long Term Goal: Adherence to prescribed nutrition plan.          Nutrition Assessments:  MEDIFICTS Score Key: >=70 Need to make dietary changes  40-70 Heart Healthy Diet <= 40 Therapeutic Level Cholesterol Diet    Picture Your Plate Scores: <59 Unhealthy dietary pattern with much room for improvement. 41-50 Dietary pattern unlikely to meet recommendations for good health and room for improvement. 51-60 More healthful dietary pattern, with some room for improvement.  >60 Healthy dietary pattern, although there may be some specific behaviors that could be improved.    Nutrition Goals Re-Evaluation:   Nutrition Goals Re-Evaluation:   Nutrition Goals Discharge (Final Nutrition Goals Re-Evaluation):   Psychosocial: Target Goals: Acknowledge presence or absence of significant depression and/or stress, maximize coping skills, provide positive support system. Participant is able to verbalize types and ability to use techniques and skills needed for reducing stress and depression.  Initial Review & Psychosocial Screening:  Initial Psych Review & Screening - 05/15/24 1552       Initial Review   Current issues with None  Identified      Family Dynamics   Good Support System? Yes   Kids and husband     Screening Interventions   Interventions Encouraged to exercise;To provide support and resources with identified psychosocial needs;Provide feedback about the scores to participant    Expected Outcomes Long Term Goal: Stressors or current issues are controlled or eliminated.;Short Term goal: Identification and review with participant of any Quality of Life or Depression concerns found by scoring the questionnaire.;Long Term goal: The participant improves quality of Life and PHQ9 Scores as seen by post scores and/or verbalization of changes          Quality of Life Scores:  Quality of Life - 05/15/24 1504       Quality of Life   Select Quality of Life      Quality of Life Scores   Health/Function Pre 26 %    Socioeconomic Pre 25.06 %    Psych/Spiritual Pre 29.58 %    Family Pre 28.8 %    GLOBAL Pre 27.09 %         Scores of 19 and below usually indicate a poorer quality of life in these areas.  A difference of  2-3 points is a clinically meaningful difference.  A difference of 2-3 points in the total score of the Quality of Life Index has been associated with significant improvement in overall quality of life, self-image, physical symptoms, and general health in studies assessing change in quality of life.  PHQ-9: Review Flowsheet  More  data exists      07/07/2024 05/15/2024 12/26/2023 09/29/2019 04/30/2017  Depression screen PHQ 2/9  Decreased Interest 0 0 0 0 0  Down, Depressed, Hopeless 0 0 0 0 0  PHQ - 2 Score 0 0 0 0 0  Altered sleeping - 0 - - -  Tired, decreased energy - 0 - - -  Change in appetite - 0 - - -  Feeling bad or failure about yourself  - 0 - - -  Trouble concentrating - 0 - - -  Moving slowly or fidgety/restless - 0 - - -  Suicidal thoughts - 0 - - -  PHQ-9 Score - 0  - - -    Details       Data saved with a previous flowsheet row definition        Interpretation of  Total Score  Total Score Depression Severity:  1-4 = Minimal depression, 5-9 = Mild depression, 10-14 = Moderate depression, 15-19 = Moderately severe depression, 20-27 = Severe depression   Psychosocial Evaluation and Intervention:   Psychosocial Re-Evaluation:  Psychosocial Re-Evaluation     Row Name 05/21/24 1636 06/02/24 1800 07/01/24 1617 07/29/24 1404       Psychosocial Re-Evaluation   Current issues with None Identified None Identified None Identified None Identified    Comments -- -- Jatavia admits that she has been through alot this past year. charlett has not voiced any increased concerns or stressors during exercise at cardiac rehab. Zareen continues not voice to  any increased concerns or stressors during exercise at cardiac rehab.    Interventions Encouraged to attend Cardiac Rehabilitation for the exercise Encouraged to attend Cardiac Rehabilitation for the exercise Encouraged to attend Cardiac Rehabilitation for the exercise Encouraged to attend Cardiac Rehabilitation for the exercise    Continue Psychosocial Services  No Follow up required No Follow up required No Follow up required No Follow up required       Psychosocial Discharge (Final Psychosocial Re-Evaluation):  Psychosocial Re-Evaluation - 07/29/24 1404       Psychosocial Re-Evaluation   Current issues with None Identified    Comments Etna continues not voice to  any increased concerns or stressors during exercise at cardiac rehab.    Interventions Encouraged to attend Cardiac Rehabilitation for the exercise    Continue Psychosocial Services  No Follow up required          Vocational Rehabilitation: Provide vocational rehab assistance to qualifying candidates.   Vocational Rehab Evaluation & Intervention:  Vocational Rehab - 05/15/24 1505       Initial Vocational Rehab Evaluation & Intervention   Assessment shows need for Vocational Rehabilitation No   Pt is on Disability          Education: Education Goals: Education classes will be provided on a weekly basis, covering required topics. Participant will state understanding/return demonstration of topics presented.    Education     Row Name 05/21/24 1100     Education   Cardiac Education Topics Pritikin   Orthoptist   Educator Dietitian   Weekly Topic Adding Flavor - Sodium-Free   Instruction Review Code 1- Verbalizes Understanding   Class Start Time 1145   Class Stop Time 1223   Class Time Calculation (min) 38 min    Row Name 06/18/24 1000     Education   Cardiac Education Topics --   Select --     Hospital Doctor --  Weekly Topic --   Instruction Review Code --    Row Name 06/20/24 1100     Education   Cardiac Education Topics Pritikin   Geographical Information Systems Officer Exercise   Exercise Workshop Exercise Basics: Building Your Action Plan   Instruction Review Code 1- [employer] Understanding    Row Name 07/09/24 1100     Education   Cardiac Education Topics Pritikin   Customer Service Manager   Weekly Topic Fast Evening Meals   Instruction Review Code 1- Verbalizes Understanding   Class Start Time 1148   Class Stop Time 1232   Class Time Calculation (min) 44 min    Row Name 07/14/24 1200     Education   Cardiac Education Topics Pritikin   Psychologist, Forensic Exercise Education   Exercise Education Improving Performance   Instruction Review Code 1- Verbalizes Understanding   Class Start Time 1150   Class Stop Time 1225   Class Time Calculation (min) 35 min    Row Name 07/16/24 1000     Education   Cardiac Education Topics Pritikin   Customer Service Manager   Weekly Topic International Cuisine- Spotlight on the [employer] Zones   Instruction Review Code  1- Verbalizes Understanding   Class Start Time 1145   Class Stop Time 1224   Class Time Calculation (min) 39 min      Core Videos: Exercise    Move It!  Clinical staff conducted group or individual video education with verbal and written material and guidebook.  Patient learns the recommended Pritikin exercise program. Exercise with the goal of living a long, healthy life. Some of the health benefits of exercise include controlled diabetes, healthier blood pressure levels, improved cholesterol levels, improved heart and lung capacity, improved sleep, and better body composition. Everyone should speak with their doctor before starting or changing an exercise routine.  Biomechanical Limitations Clinical staff conducted group or individual video education with verbal and written material and guidebook.  Patient learns how biomechanical limitations can impact exercise and how we can mitigate and possibly overcome limitations to have an impactful and balanced exercise routine.  Body Composition Clinical staff conducted group or individual video education with verbal and written material and guidebook.  Patient learns that body composition (ratio of muscle mass to fat mass) is a key component to assessing overall fitness, rather than body weight alone. Increased fat mass, especially visceral belly fat, can put us  at increased risk for metabolic syndrome, type 2 diabetes, heart disease, and even death. It is recommended to combine diet and exercise (cardiovascular and resistance training) to improve your body composition. Seek guidance from your physician and exercise physiologist before implementing an exercise routine.  Exercise Action Plan Clinical staff conducted group or individual video education with verbal and written material and guidebook.  Patient learns the recommended strategies to achieve and enjoy long-term exercise adherence, including variety, self-motivation, self-efficacy, and  positive decision making. Benefits of exercise include fitness, good health, weight management, more energy, better sleep, less stress, and overall well-being.  Medical   Heart Disease Risk Reduction Clinical staff conducted group or individual video education with verbal and written material and guidebook.  Patient learns our heart is our most vital organ as  it circulates oxygen, nutrients, white blood cells, and hormones throughout the entire body, and carries waste away. Data supports a plant-based eating plan like the Pritikin Program for its effectiveness in slowing progression of and reversing heart disease. The video provides a number of recommendations to address heart disease.   Metabolic Syndrome and Belly Fat  Clinical staff conducted group or individual video education with verbal and written material and guidebook.  Patient learns what metabolic syndrome is, how it leads to heart disease, and how one can reverse it and keep it from coming back. You have metabolic syndrome if you have 3 of the following 5 criteria: abdominal obesity, high blood pressure, high triglycerides, low HDL cholesterol, and high blood sugar.  Hypertension and Heart Disease Clinical staff conducted group or individual video education with verbal and written material and guidebook.  Patient learns that high blood pressure, or hypertension, is very common in the [place] . Hypertension is largely due to excessive salt intake, but other important risk factors include being overweight, physical inactivity, drinking too much alcohol, smoking, and not eating enough potassium from fruits and vegetables. High blood pressure is a leading risk factor for heart attack, stroke, congestive heart failure, dementia, kidney failure, and premature death. Long-term effects of excessive salt intake include stiffening of the arteries and thickening of heart muscle and organ damage. Recommendations include ways to reduce hypertension  and the risk of heart disease.  Diseases of Our Time - Focusing on Diabetes Clinical staff conducted group or individual video education with verbal and written material and guidebook.  Patient learns why the best way to stop diseases of our time is prevention, through food and other lifestyle changes. Medicine (such as prescription pills and surgeries) is often only a Band-Aid on the problem, not a long-term solution. Most common diseases of our time include obesity, type 2 diabetes, hypertension, heart disease, and cancer. The Pritikin Program is recommended and has been proven to help reduce, reverse, and/or prevent the damaging effects of metabolic syndrome.  Nutrition   Overview of the Pritikin Eating Plan  Clinical staff conducted group or individual video education with verbal and written material and guidebook.  Patient learns about the Pritikin Eating Plan for disease risk reduction. The Pritikin Eating Plan emphasizes a wide variety of unrefined, minimally-processed carbohydrates, like fruits, vegetables, whole grains, and legumes. Go, Caution, and Stop food choices are explained. Plant-based and lean animal proteins are emphasized. Rationale provided for low sodium intake for blood pressure control, low added sugars for blood sugar stabilization, and low added fats and oils for coronary artery disease risk reduction and weight management.  Calorie Density  Clinical staff conducted group or individual video education with verbal and written material and guidebook.  Patient learns about calorie density and how it impacts the Pritikin Eating Plan. Knowing the characteristics of the food you choose will help you decide whether those foods will lead to weight gain or weight loss, and whether you want to consume more or less of them. Weight loss is usually a side effect of the Pritikin Eating Plan because of its focus on low calorie-dense foods.  Label Reading  Clinical staff conducted group or  individual video education with verbal and written material and guidebook.  Patient learns about the Pritikin recommended label reading guidelines and corresponding recommendations regarding calorie density, added sugars, sodium content, and whole grains.  Dining Out - Part 1  Clinical staff conducted group or individual video education with verbal and written material  and guidebook.  Patient learns that restaurant meals can be sabotaging because they can be so high in calories, fat, sodium, and/or sugar. Patient learns recommended strategies on how to positively address this and avoid unhealthy pitfalls.  Facts on Fats  Clinical staff conducted group or individual video education with verbal and written material and guidebook.  Patient learns that lifestyle modifications can be just as effective, if not more so, as many medications for lowering your risk of heart disease. A Pritikin lifestyle can help to reduce your risk of inflammation and atherosclerosis (cholesterol build-up, or plaque, in the artery walls). Lifestyle interventions such as dietary choices and physical activity address the cause of atherosclerosis. A review of the types of fats and their impact on blood cholesterol levels, along with dietary recommendations to reduce fat intake is also included.  Nutrition Action Plan  Clinical staff conducted group or individual video education with verbal and written material and guidebook.  Patient learns how to incorporate Pritikin recommendations into their lifestyle. Recommendations include planning and keeping personal health goals in mind as an important part of their success.  Healthy Mind-Set    Healthy Minds, Bodies, Hearts  Clinical staff conducted group or individual video education with verbal and written material and guidebook.  Patient learns how to identify when they are stressed. Video will discuss the impact of that stress, as well as the many benefits of stress management.  Patient will also be introduced to stress management techniques. The way we think, act, and feel has an impact on our hearts.  How Our Thoughts Can Heal Our Hearts  Clinical staff conducted group or individual video education with verbal and written material and guidebook.  Patient learns that negative thoughts can cause depression and anxiety. This can result in negative lifestyle behavior and serious health problems. Cognitive behavioral therapy is an effective method to help control our thoughts in order to change and improve our emotional outlook.  Additional Videos:  Exercise    Improving Performance  Clinical staff conducted group or individual video education with verbal and written material and guidebook.  Patient learns to use a non-linear approach by alternating intensity levels and lengths of time spent exercising to help burn more calories and lose more body fat. Cardiovascular exercise helps improve heart health, metabolism, hormonal balance, blood sugar control, and recovery from fatigue. Resistance training improves strength, endurance, balance, coordination, reaction time, metabolism, and muscle mass. Flexibility exercise improves circulation, posture, and balance. Seek guidance from your physician and exercise physiologist before implementing an exercise routine and learn your capabilities and proper form for all exercise.  Introduction to Yoga  Clinical staff conducted group or individual video education with verbal and written material and guidebook.  Patient learns about yoga, a discipline of the coming together of mind, breath, and body. The benefits of yoga include improved flexibility, improved range of motion, better posture and core strength, increased lung function, weight loss, and positive self-image. Yogas heart health benefits include lowered blood pressure, healthier heart rate, decreased cholesterol and triglyceride levels, improved immune function, and reduced stress.  Seek guidance from your physician and exercise physiologist before implementing an exercise routine and learn your capabilities and proper form for all exercise.  Medical   Aging: Enhancing Your Quality of Life  Clinical staff conducted group or individual video education with verbal and written material and guidebook.  Patient learns key strategies and recommendations to stay in good physical health and enhance quality of life, such as prevention strategies,  having an advocate, securing a Health Care Proxy and Power of Attorney, and keeping a list of medications and system for tracking them. It also discusses how to avoid risk for bone loss.  Biology of Weight Control  Clinical staff conducted group or individual video education with verbal and written material and guidebook.  Patient learns that weight gain occurs because we consume more calories than we burn (eating more, moving less). Even if your body weight is normal, you may have higher ratios of fat compared to muscle mass. Too much body fat puts you at increased risk for cardiovascular disease, heart attack, stroke, type 2 diabetes, and obesity-related cancers. In addition to exercise, following the Pritikin Eating Plan can help reduce your risk.  Decoding Lab Results  Clinical staff conducted group or individual video education with verbal and written material and guidebook.  Patient learns that lab test reflects one measurement whose values change over time and are influenced by many factors, including medication, stress, sleep, exercise, food, hydration, pre-existing medical conditions, and more. It is recommended to use the knowledge from this video to become more involved with your lab results and evaluate your numbers to speak with your doctor.   Diseases of Our Time - Overview  Clinical staff conducted group or individual video education with verbal and written material and guidebook.  Patient learns that according to the CDC, 50%  to 70% of chronic diseases (such as obesity, type 2 diabetes, elevated lipids, hypertension, and heart disease) are avoidable through lifestyle improvements including healthier food choices, listening to satiety cues, and increased physical activity.  Sleep Disorders Clinical staff conducted group or individual video education with verbal and written material and guidebook.  Patient learns how good quality and duration of sleep are important to overall health and well-being. Patient also learns about sleep disorders and how they impact health along with recommendations to address them, including discussing with a physician.  Nutrition  Dining Out - Part 2 Clinical staff conducted group or individual video education with verbal and written material and guidebook.  Patient learns how to plan ahead and communicate in order to maximize their dining experience in a healthy and nutritious manner. Included are recommended food choices based on the type of restaurant the patient is visiting.   Fueling a Banker conducted group or individual video education with verbal and written material and guidebook.  There is a strong connection between our food choices and our health. Diseases like obesity and type 2 diabetes are very prevalent and are in large-part due to lifestyle choices. The Pritikin Eating Plan provides plenty of food and hunger-curbing satisfaction. It is easy to follow, affordable, and helps reduce health risks.  Menu Workshop  Clinical staff conducted group or individual video education with verbal and written material and guidebook.  Patient learns that restaurant meals can sabotage health goals because they are often packed with calories, fat, sodium, and sugar. Recommendations include strategies to plan ahead and to communicate with the manager, chef, or server to help order a healthier meal.  Planning Your Eating Strategy  Clinical staff conducted group or individual  video education with verbal and written material and guidebook.  Patient learns about the Pritikin Eating Plan and its benefit of reducing the risk of disease. The Pritikin Eating Plan does not focus on calories. Instead, it emphasizes high-quality, nutrient-rich foods. By knowing the characteristics of the foods, we choose, we can determine their calorie density and make informed decisions.  Targeting Your Nutrition Priorities  Clinical staff conducted group or individual video education with verbal and written material and guidebook.  Patient learns that lifestyle habits have a tremendous impact on disease risk and progression. This video provides eating and physical activity recommendations based on your personal health goals, such as reducing LDL cholesterol, losing weight, preventing or controlling type 2 diabetes, and reducing high blood pressure.  Vitamins and Minerals  Clinical staff conducted group or individual video education with verbal and written material and guidebook.  Patient learns different ways to obtain key vitamins and minerals, including through a recommended healthy diet. It is important to discuss all supplements you take with your doctor.   Healthy Mind-Set    Smoking Cessation  Clinical staff conducted group or individual video education with verbal and written material and guidebook.  Patient learns that cigarette smoking and tobacco addiction pose a serious health risk which affects millions of people. Stopping smoking will significantly reduce the risk of heart disease, lung disease, and many forms of cancer. Recommended strategies for quitting are covered, including working with your doctor to develop a successful plan.  Culinary   Becoming a Set Designer conducted group or individual video education with verbal and written material and guidebook.  Patient learns that cooking at home can be healthy, cost-effective, quick, and puts them in control.  Keys to cooking healthy recipes will include looking at your recipe, assessing your equipment needs, planning ahead, making it simple, choosing cost-effective seasonal ingredients, and limiting the use of added fats, salts, and sugars.  Cooking - Breakfast and Snacks  Clinical staff conducted group or individual video education with verbal and written material and guidebook.  Patient learns how important breakfast is to satiety and nutrition through the entire day. Recommendations include key foods to eat during breakfast to help stabilize blood sugar levels and to prevent overeating at meals later in the day. Planning ahead is also a key component.  Cooking - Educational Psychologist conducted group or individual video education with verbal and written material and guidebook.  Patient learns eating strategies to improve overall health, including an approach to cook more at home. Recommendations include thinking of animal protein as a side on your plate rather than center stage and focusing instead on lower calorie dense options like vegetables, fruits, whole grains, and plant-based proteins, such as beans. Making sauces in large quantities to freeze for later and leaving the skin on your vegetables are also recommended to maximize your experience.  Cooking - Healthy Salads and Dressing Clinical staff conducted group or individual video education with verbal and written material and guidebook.  Patient learns that vegetables, fruits, whole grains, and legumes are the foundations of the Pritikin Eating Plan. Recommendations include how to incorporate each of these in flavorful and healthy salads, and how to create homemade salad dressings. Proper handling of ingredients is also covered. Cooking - Soups and [employer] - Soups and Desserts Clinical staff conducted group or individual video education with verbal and written material and guidebook.  Patient learns that Pritikin soups and  desserts make for easy, nutritious, and delicious snacks and meal components that are low in sodium, fat, sugar, and calorie density, while high in vitamins, minerals, and filling fiber. Recommendations include simple and healthy ideas for soups and desserts.   Overview     The Pritikin Solution Program Overview Clinical staff conducted group or individual video education with verbal and written material and  guidebook.  Patient learns that the results of the Pritikin Program have been documented in more than 100 articles published in peer-reviewed journals, and the benefits include reducing risk factors for (and, in some cases, even reversing) high cholesterol, high blood pressure, type 2 diabetes, obesity, and more! An overview of the three key pillars of the Pritikin Program will be covered: eating well, doing regular exercise, and having a healthy mind-set.  WORKSHOPS  Exercise: Exercise Basics: Building Your Action Plan Clinical staff led group instruction and group discussion with PowerPoint presentation and patient guidebook. To enhance the learning environment the use of posters, models and videos may be added. At the conclusion of this workshop, patients will comprehend the difference between physical activity and exercise, as well as the benefits of incorporating both, into their routine. Patients will understand the FITT (Frequency, Intensity, Time, and Type) principle and how to use it to build an exercise action plan. In addition, safety concerns and other considerations for exercise and cardiac rehab will be addressed by the presenter. The purpose of this lesson is to promote a comprehensive and effective weekly exercise routine in order to improve patients overall level of fitness.   Managing Heart Disease: Your Path to a Healthier Heart Clinical staff led group instruction and group discussion with PowerPoint presentation and patient guidebook. To enhance the learning environment  the use of posters, models and videos may be added.At the conclusion of this workshop, patients will understand the anatomy and physiology of the heart. Additionally, they will understand how Pritikins three pillars impact the risk factors, the progression, and the management of heart disease.  The purpose of this lesson is to provide a high-level overview of the heart, heart disease, and how the Pritikin lifestyle positively impacts risk factors.  Exercise Biomechanics Clinical staff led group instruction and group discussion with PowerPoint presentation and patient guidebook. To enhance the learning environment the use of posters, models and videos may be added. Patients will learn how the structural parts of their bodies function and how these functions impact their daily activities, movement, and exercise. Patients will learn how to promote a neutral spine, learn how to manage pain, and identify ways to improve their physical movement in order to promote healthy living. The purpose of this lesson is to expose patients to common physical limitations that impact physical activity. Participants will learn practical ways to adapt and manage aches and pains, and to minimize their effect on regular exercise. Patients will learn how to maintain good posture while sitting, walking, and lifting.  Balance Training and Fall Prevention  Clinical staff led group instruction and group discussion with PowerPoint presentation and patient guidebook. To enhance the learning environment the use of posters, models and videos may be added. At the conclusion of this workshop, patients will understand the importance of their sensorimotor skills (vision, proprioception, and the vestibular system) in maintaining their ability to balance as they age. Patients will apply a variety of balancing exercises that are appropriate for their current level of function. Patients will understand the common causes for  poor balance, possible solutions to these problems, and ways to modify their physical environment in order to minimize their fall risk. The purpose of this lesson is to teach patients about the importance of maintaining balance as they age and ways to minimize their risk of falling.  WORKSHOPS   Nutrition:  Fueling a Ship Broker led group instruction and group discussion with PowerPoint presentation and patient guidebook. To enhance  the learning environment the use of posters, models and videos may be added. Patients will review the foundational principles of the Pritikin Eating Plan and understand what constitutes a serving size in each of the food groups. Patients will also learn Pritikin-friendly foods that are better choices when away from home and review make-ahead meal and snack options. Calorie density will be reviewed and applied to three nutrition priorities: weight maintenance, weight loss, and weight gain. The purpose of this lesson is to reinforce (in a group setting) the key concepts around what patients are recommended to eat and how to apply these guidelines when away from home by planning and selecting Pritikin-friendly options. Patients will understand how calorie density may be adjusted for different weight management goals.  Mindful Eating  Clinical staff led group instruction and group discussion with PowerPoint presentation and patient guidebook. To enhance the learning environment the use of posters, models and videos may be added. Patients will briefly review the concepts of the Pritikin Eating Plan and the importance of low-calorie dense foods. The concept of mindful eating will be introduced as well as the importance of paying attention to internal hunger signals. Triggers for non-hunger eating and techniques for dealing with triggers will be explored. The purpose of this lesson is to provide patients with the opportunity to review the basic principles of the  Pritikin Eating Plan, discuss the value of eating mindfully and how to measure internal cues of hunger and fullness using the Hunger Scale. Patients will also discuss reasons for non-hunger eating and learn strategies to use for controlling emotional eating.  Targeting Your Nutrition Priorities Clinical staff led group instruction and group discussion with PowerPoint presentation and patient guidebook. To enhance the learning environment the use of posters, models and videos may be added. Patients will learn how to determine their genetic susceptibility to disease by reviewing their family history. Patients will gain insight into the importance of diet as part of an overall healthy lifestyle in mitigating the impact of genetics and other environmental insults. The purpose of this lesson is to provide patients with the opportunity to assess their personal nutrition priorities by looking at their family history, their own health history and current risk factors. Patients will also be able to discuss ways of prioritizing and modifying the Pritikin Eating Plan for their highest risk areas  Menu  Clinical staff led group instruction and group discussion with PowerPoint presentation and patient guidebook. To enhance the learning environment the use of posters, models and videos may be added. Using menus brought in from [employer], or printed from [employer], patients will apply the Pritikin dining out guidelines that were presented in the [employer] video. Patients will also be able to practice these guidelines in a variety of provided scenarios. The purpose of this lesson is to provide patients with the opportunity to practice hands-on learning of the Pritikin Dining Out guidelines with actual menus and practice scenarios.  Label Reading Clinical staff led group instruction and group discussion with PowerPoint presentation and patient guidebook. To enhance the learning environment  the use of posters, models and videos may be added. Patients will review and discuss the Pritikin label reading guidelines presented in Pritikins Label Reading Educational series video. Using fool labels brought in from local grocery stores and markets, patients will apply the label reading guidelines and determine if the packaged food meet the Pritikin guidelines. The purpose of this lesson is to provide patients with the opportunity to review, discuss,  and practice hands-on learning of the Pritikin Label Reading guidelines with actual packaged food labels. Cooking School  Pritikins [employer] are designed to teach patients ways to prepare quick, simple, and affordable recipes at home. The importance of nutritions role in chronic disease risk reduction is reflected in its emphasis in the overall Pritikin program. By learning how to prepare essential core Pritikin Eating Plan recipes, patients will increase control over what they eat; be able to customize the flavor of foods without the use of added salt, sugar, or fat; and improve the quality of the food they consume. By learning a set of core recipes which are easily assembled, quickly prepared, and affordable, patients are more likely to prepare more healthy foods at home. These workshops focus on convenient breakfasts, simple entres, side dishes, and desserts which can be prepared with minimal effort and are consistent with nutrition recommendations for cardiovascular risk reduction. Cooking [employer] are taught by a armed forces logistics/support/administrative officer (RD) who has been trained by the [employer]. The chef or RD has a clear understanding of the importance of minimizing - if not completely eliminating - added fat, sugar, and sodium in recipes. Throughout the series of Cooking School Workshop sessions, patients will learn about healthy ingredients and efficient methods of cooking to build confidence in their capability to  prepare    Cooking School weekly topics:  Adding Flavor- Sodium-Free  Fast and Healthy Breakfasts  Powerhouse Plant-Based Proteins  Satisfying Salads and Dressings  Simple Sides and Sauces  International Cuisine-Spotlight on the [employer] Zones  Delicious Desserts  Savory Soups  [employer] - Meals in a Astronomer Appetizers and Snacks  Comforting Weekend Breakfasts  One-Pot Wonders   Fast Evening Meals  Landscape Architect Your Pritikin Plate  WORKSHOPS   Healthy Mindset (Psychosocial):  Focused Goals, Sustainable Changes Clinical staff led group instruction and group discussion with PowerPoint presentation and patient guidebook. To enhance the learning environment the use of posters, models and videos may be added. Patients will be able to apply effective goal setting strategies to establish at least one personal goal, and then take consistent, meaningful action toward that goal. They will learn to identify common barriers to achieving personal goals and develop strategies to overcome them. Patients will also gain an understanding of how our mind-set can impact our ability to achieve goals and the importance of cultivating a positive and growth-oriented mind-set. The purpose of this lesson is to provide patients with a deeper understanding of how to set and achieve personal goals, as well as the tools and strategies needed to overcome common obstacles which may arise along the way.  From Head to Heart: The Power of a Healthy Outlook  Clinical staff led group instruction and group discussion with PowerPoint presentation and patient guidebook. To enhance the learning environment the use of posters, models and videos may be added. Patients will be able to recognize and describe the impact of emotions and mood on physical health. They will discover the importance of self-care and explore self-care practices which may work for them. Patients will also learn how to utilize  the 4 Cs to cultivate a healthier outlook and better manage stress and challenges. The purpose of this lesson is to demonstrate to patients how a healthy outlook is an essential part of maintaining good health, especially as they continue their cardiac rehab journey.  Healthy Sleep for a Healthy Heart Clinical staff led group instruction and group discussion  with PowerPoint presentation and patient guidebook. To enhance the learning environment the use of posters, models and videos may be added. At the conclusion of this workshop, patients will be able to demonstrate knowledge of the importance of sleep to overall health, well-being, and quality of life. They will understand the symptoms of, and treatments for, common sleep disorders. Patients will also be able to identify daytime and nighttime behaviors which impact sleep, and they will be able to apply these tools to help manage sleep-related challenges. The purpose of this lesson is to provide patients with a general overview of sleep and outline the importance of quality sleep. Patients will learn about a few of the most common sleep disorders. Patients will also be introduced to the concept of sleep hygiene, and discover ways to self-manage certain sleeping problems through simple daily behavior changes. Finally, the workshop will motivate patients by clarifying the links between quality sleep and their goals of heart-healthy living.   Recognizing and Reducing Stress Clinical staff led group instruction and group discussion with PowerPoint presentation and patient guidebook. To enhance the learning environment the use of posters, models and videos may be added. At the conclusion of this workshop, patients will be able to understand the types of stress reactions, differentiate between acute and chronic stress, and recognize the impact that chronic stress has on their health. They will also be able to apply different coping mechanisms, such as reframing  negative self-talk. Patients will have the opportunity to practice a variety of stress management techniques, such as deep abdominal breathing, progressive muscle relaxation, and/or guided imagery.  The purpose of this lesson is to educate patients on the role of stress in their lives and to provide healthy techniques for coping with it.  Learning Barriers/Preferences:  Learning Barriers/Preferences - 05/15/24 1506       Learning Barriers/Preferences   Learning Barriers None    Learning Preferences Audio;Computer/Internet;Group Instruction;Individual Instruction;Skilled Demonstration;Pictoral;Verbal Instruction;Video;Written Material          Education Topics:  Knowledge Questionnaire Score:  Knowledge Questionnaire Score - 05/15/24 1505       Knowledge Questionnaire Score   Pre Score 21/24          Core Components/Risk Factors/Patient Goals at Admission:  Personal Goals and Risk Factors at Admission - 05/15/24 1547       Core Components/Risk Factors/Patient Goals on Admission    Weight Management Yes;Weight Loss    Intervention Weight Management: Develop a combined nutrition and exercise program designed to reach desired caloric intake, while maintaining appropriate intake of nutrient and fiber, sodium and fats, and appropriate energy expenditure required for the weight goal.;Weight Management: Provide education and appropriate resources to help participant work on and attain dietary goals.    Expected Outcomes Short Term: Continue to assess and modify interventions until short term weight is achieved;Long Term: Adherence to nutrition and physical activity/exercise program aimed toward attainment of established weight goal;Weight Loss: Understanding of general recommendations for a balanced deficit meal plan, which promotes 1-2 lb weight loss per week and includes a negative energy balance of [phone] kcal/d;Understanding recommendations for meals to include 15-35% energy as  protein, 25-35% energy from fat, 35-60% energy from carbohydrates, less than 200mg  of dietary cholesterol, 20-35 gm of total fiber daily;Understanding of distribution of calorie intake throughout the day with the consumption of 4-5 meals/snacks    Heart Failure Yes    Intervention Provide a combined exercise and nutrition program that is supplemented with education, support and counseling about  heart failure. Directed toward relieving symptoms such as shortness of breath, decreased exercise tolerance, and extremity edema.    Expected Outcomes Improve functional capacity of life;Short term: Attendance in program 2-3 days a week with increased exercise capacity. Reported lower sodium intake. Reported increased fruit and vegetable intake. Reports medication compliance.;Short term: Daily weights obtained and reported for increase. Utilizing diuretic protocols set by physician.;Long term: Adoption of self-care skills and reduction of barriers for early signs and symptoms recognition and intervention leading to self-care maintenance.    Hypertension Yes    Intervention Provide education on lifestyle modifcations including regular physical activity/exercise, weight management, moderate sodium restriction and increased consumption of fresh fruit, vegetables, and low fat dairy, alcohol moderation, and smoking cessation.;Monitor prescription use compliance.    Expected Outcomes Short Term: Continued assessment and intervention until BP is < 140/73mm HG in hypertensive participants. < 130/57mm HG in hypertensive participants with diabetes, heart failure or chronic kidney disease.;Long Term: Maintenance of blood pressure at goal levels.    Lipids Yes    Intervention Provide education and support for participant on nutrition & aerobic/resistive exercise along with prescribed medications to achieve LDL 70mg , HDL >40mg .    Expected Outcomes Short Term: Participant states understanding of desired cholesterol values and is  compliant with medications prescribed. Participant is following exercise prescription and nutrition guidelines.;Long Term: Cholesterol controlled with medications as prescribed, with individualized exercise RX and with personalized nutrition plan. Value goals: LDL < 70mg , HDL > 40 mg.    Personal Goal Other Yes    Personal Goal ST and long term: stabilize weight (lower fluid), eat healthier, increase flexibility    Intervention Will continue to monitor pt and progress workloads as tolerated without sign or symptom    Expected Outcomes Pt will achieve her goals          Core Components/Risk Factors/Patient Goals Review:   Goals and Risk Factor Review     Row Name 05/21/24 1637 06/02/24 1803 07/01/24 1619 07/29/24 1409       Core Components/Risk Factors/Patient Goals Review   Personal Goals Review Weight Management/Obesity;Heart Failure;Hypertension;Lipids Weight Management/Obesity;Heart Failure;Hypertension;Lipids Weight Management/Obesity;Heart Failure;Hypertension;Lipids Weight Management/Obesity;Heart Failure;Hypertension;Lipids    Review Jordin started cardiac rehab on 05/21/24. Anaisha did well with excise. Vital signs were stable. Spot checked CBG's WNL. Sharnae does not check her CBG's at home. Catalyna started cardiac rehab on 05/21/24. Kerstyn is off to a good start with excise. Vital signs have been  stable. Yarel's weight is up 3.2 kg since starting the program. the pharmacist at the heart failure clinic is aware of weight gain. Medications have been adjusted. Rebekha is doing well with exercise at  cardiac rehab when in attendance.. Vital signs have been stable. Elasha's weight is up 5.6  kg since starting the program. Draven is doing well with exercise at  cardiac rehab when in attendance.. Vital signs remain stable. Amyia's weight is up 4.1 kg since starting the program. Metha will tenatively complete cardiac rehab on 08/26/23.    Expected Outcomes Ashiah will continue to  paritcipate on cardiac rehab for exercise, nutrition and lifestyle modifications Henslee will continue to paritcipate on cardiac rehab for exercise, nutrition and lifestyle modifications Dalaysia will continue to paritcipate on cardiac rehab for exercise, nutrition and lifestyle modifications Jeroline will continue to paritcipate on cardiac rehab for exercise, nutrition and lifestyle modifications       Core Components/Risk Factors/Patient Goals at Discharge (Final Review):   Goals and Risk Factor Review - 07/29/24 1409  Core Components/Risk Factors/Patient Goals Review   Personal Goals Review Weight Management/Obesity;Heart Failure;Hypertension;Lipids    Review Rhilynn is doing well with exercise at  cardiac rehab when in attendance.. Vital signs remain stable. Matsue's weight is up 4.1 kg since starting the program. Jammi will tenatively complete cardiac rehab on 08/26/23.    Expected Outcomes Amaal will continue to paritcipate on cardiac rehab for exercise, nutrition and lifestyle modifications          ITP Comments:  ITP Comments     Row Name 05/15/24 1026 05/21/24 1634 06/02/24 1759 07/01/24 1616 07/29/24 1402   ITP Comments ITP Comments Wilbert Bihari, MD: Medical Director. Introduction to the Pritikin Education Program/Intensive Cardiac Rehab. Initial orientation packet reviewed with the patient. 30 day ITP Review. Karlissa started cardiac rehab on 05/01/24. Ambria did well with exercise. 30 day ITP Review. Valincia is off to a good start with exercise at cardiac rehab. 30 day ITP Review. Deserea has good participation  with exercise at cardiac rehab when in attendance. 30 day ITP Review. Venessa continues to have  good participation with exercise at cardiac rehab when in attendance. Sharmaine will tenatively complete cardiac rehab on 08/26/23.      Comments: See ITP Comments     [1]  Current Outpatient Medications:    acetaminophen  (TYLENOL ) 325 MG tablet, Take 325-650 mg by  mouth every 6 (six) hours as needed for mild pain (pain score 1-3) or moderate pain (pain score 4-6)., Disp: , Rfl:    FARXIGA  10 MG TABS tablet, Take 1 tablet (10 mg total) by mouth daily., Disp: 90 tablet, Rfl: 1   furosemide  (LASIX ) 40 MG tablet, Take 1 tablet (40 mg total) by mouth daily as needed for fluid or edema (Weight gain >3 lbs in a day or >5 lbs in one week)., Disp: 30 tablet, Rfl: 5   methocarbamol  (ROBAXIN ) 500 MG tablet, Take 500 mg by mouth 3 (three) times daily., Disp: , Rfl:    metoprolol  succinate (TOPROL -XL) 100 MG 24 hr tablet, Take 1.5 tablets (150 mg total) by mouth daily., Disp: 90 tablet, Rfl: 5   polyethylene glycol powder (GLYCOLAX /MIRALAX ) 17 GM/SCOOP powder, Take 17 g by mouth daily., Disp: 238 g, Rfl: 0   sacubitril -valsartan  (ENTRESTO ) 24-26 MG, Take 1 tablet by mouth 2 (two) times daily., Disp: 60 tablet, Rfl: 2   spironolactone  (ALDACTONE ) 25 MG tablet, Take 1 tablet (25 mg total) by mouth daily., Disp: 30 tablet, Rfl: 5 [2]  Social History Tobacco Use  Smoking Status Never  Smokeless Tobacco Never

## 2024-07-30 ENCOUNTER — Encounter (HOSPITAL_COMMUNITY)

## 2024-07-30 ENCOUNTER — Telehealth (HOSPITAL_COMMUNITY): Payer: Self-pay

## 2024-07-30 NOTE — Telephone Encounter (Signed)
 Patient c/o sick for 10:15 CR class.

## 2024-08-01 ENCOUNTER — Other Ambulatory Visit: Payer: Self-pay

## 2024-08-01 ENCOUNTER — Encounter (HOSPITAL_COMMUNITY)

## 2024-08-01 DIAGNOSIS — C183 Malignant neoplasm of hepatic flexure: Secondary | ICD-10-CM

## 2024-08-04 ENCOUNTER — Encounter (HOSPITAL_COMMUNITY)
Admission: RE | Admit: 2024-08-04 | Discharge: 2024-08-04 | Disposition: A | Discharge status: Home / Self Care | Source: Ambulatory Visit | Attending: Cardiology | Admitting: Cardiology

## 2024-08-04 ENCOUNTER — Inpatient Hospital Stay: Attending: Nurse Practitioner

## 2024-08-04 ENCOUNTER — Ambulatory Visit (HOSPITAL_COMMUNITY)

## 2024-08-04 DIAGNOSIS — I5022 Chronic systolic (congestive) heart failure: Secondary | ICD-10-CM | POA: Insufficient documentation

## 2024-08-06 ENCOUNTER — Encounter (HOSPITAL_COMMUNITY)
Admission: RE | Admit: 2024-08-06 | Discharge: 2024-08-06 | Disposition: A | Discharge status: Home / Self Care | Source: Ambulatory Visit | Attending: Cardiology | Admitting: Cardiology

## 2024-08-06 ENCOUNTER — Telehealth (HOSPITAL_COMMUNITY): Payer: Self-pay | Admitting: Cardiology

## 2024-08-06 DIAGNOSIS — I5022 Chronic systolic (congestive) heart failure: Secondary | ICD-10-CM

## 2024-08-06 NOTE — Progress Notes (Signed)
 Weight is up 2.2 kg today from last day of attendance on 07/23/24. Upon assessment lung fields essentially clear upon with diminished breath sounds right posterior base. Oxygen saturation 99% on room air. Bilateral ankle edema present. No lower leg edema noted. Denies shortness of breath. [name] says she has been out with a cold taking medications as prescribed. Left a message with the heart failure clinic about weight gain. Kristyanna has gained a total of 5.1 kg since starting cardiac rehab.Hadassah Elpidio Quan RN BSN

## 2024-08-06 NOTE — Telephone Encounter (Signed)
 Leah Hernandez with called to report the following:  Weight is up 2.2 kg today from last day of attendance on 07/23/24. Upon assessment lung fields essentially clear upon with diminished breath sounds right posterior base. Oxygen saturation 99% on room air. Bilateral ankle edema present. No lower leg edema noted. Denies shortness of breath. [name] says she has been out with a cold taking medications as prescribed. Left a message with the heart failure clinic about weight gain. [name] has gained a total of 5.1 kg since starting cardiac rehab.Hadassah Elpidio Quan RN BSN         Electronically signed by Quan Hadassah ORN, RN at 08/06/2024 11:23 AM  -please advise if changes are needed

## 2024-08-08 ENCOUNTER — Encounter (HOSPITAL_COMMUNITY): Admission: RE | Admit: 2024-08-08

## 2024-08-08 ENCOUNTER — Telehealth (HOSPITAL_COMMUNITY): Payer: Self-pay

## 2024-08-08 NOTE — Telephone Encounter (Signed)
 Patient c/o for 10:15 CR class, no reason given.

## 2024-08-11 ENCOUNTER — Encounter (HOSPITAL_COMMUNITY)
Admission: RE | Admit: 2024-08-11 | Discharge: 2024-08-11 | Disposition: A | Source: Ambulatory Visit | Attending: Cardiology

## 2024-08-11 DIAGNOSIS — I5022 Chronic systolic (congestive) heart failure: Secondary | ICD-10-CM

## 2024-08-11 NOTE — Assessment & Plan Note (Signed)
 G2, pT3 N0 M0, stage IIA, MMR normal -Presented with IDA, 30 lbs unintentional weight loss, constipation with rectal bleeding.  -EGD/Colonoscopy by Dr. Suzann 12/14/23 showing gastritis and small hernia as well as a malignant appearing partially obstructing 7 cm tumor in the transverse colon. Path confirmed adenocarcinoma. S/p surgical resection on 12/18/2023. -continue surveillance. CT and GuardantReveal in early Dec 2025 were negative.

## 2024-08-12 ENCOUNTER — Inpatient Hospital Stay: Admitting: Hematology

## 2024-08-12 ENCOUNTER — Ambulatory Visit (HOSPITAL_COMMUNITY)
Admission: RE | Admit: 2024-08-12 | Discharge: 2024-08-12 | Disposition: A | Discharge status: Home / Self Care | Source: Ambulatory Visit | Attending: Hematology | Admitting: Hematology

## 2024-08-12 DIAGNOSIS — N83202 Unspecified ovarian cyst, left side: Secondary | ICD-10-CM | POA: Diagnosis present

## 2024-08-12 MED ORDER — GADOBUTROL 1 MMOL/ML IV SOLN
10.0000 mL | Freq: Once | INTRAVENOUS | Status: AC | PRN
  Administered 2024-08-12: 10 mL via INTRAVENOUS

## 2024-08-13 ENCOUNTER — Encounter (HOSPITAL_COMMUNITY)
Admission: RE | Admit: 2024-08-13 | Discharge: 2024-08-13 | Disposition: A | Discharge status: Home / Self Care | Source: Ambulatory Visit | Attending: Cardiology | Admitting: Cardiology

## 2024-08-13 DIAGNOSIS — I5022 Chronic systolic (congestive) heart failure: Secondary | ICD-10-CM

## 2024-08-15 ENCOUNTER — Encounter (HOSPITAL_COMMUNITY)
Admission: RE | Admit: 2024-08-15 | Discharge: 2024-08-15 | Disposition: A | Discharge status: Home / Self Care | Source: Ambulatory Visit | Attending: Cardiology | Admitting: Cardiology

## 2024-08-15 DIAGNOSIS — I5022 Chronic systolic (congestive) heart failure: Secondary | ICD-10-CM

## 2024-08-18 ENCOUNTER — Encounter (HOSPITAL_COMMUNITY)
Admission: RE | Admit: 2024-08-18 | Discharge: 2024-08-18 | Disposition: A | Source: Ambulatory Visit | Attending: Cardiology

## 2024-08-18 DIAGNOSIS — I5022 Chronic systolic (congestive) heart failure: Secondary | ICD-10-CM

## 2024-08-18 NOTE — Assessment & Plan Note (Signed)
 G2, pT3 N0 M0, stage IIA, MMR normal -Presented with IDA, 30 lbs unintentional weight loss, constipation with rectal bleeding.  -EGD/Colonoscopy by Dr. Suzann 12/14/23 showing gastritis and small hernia as well as a malignant appearing partially obstructing 7 cm tumor in the transverse colon. Path confirmed adenocarcinoma. S/p surgical resection on 12/18/2023. -continue surveillance. CT and [facility] in early Dec 2025 were negative.  - CT in December 2025 showed a 5.0 cm left ovarian cyst, was further evaluated by pelvic MRI, which showed simple cyst.  Due to the enlarging size, will refer her to GYN.

## 2024-08-19 ENCOUNTER — Telehealth: Payer: Self-pay | Admitting: *Deleted

## 2024-08-19 ENCOUNTER — Inpatient Hospital Stay: Admitting: Hematology

## 2024-08-19 DIAGNOSIS — C183 Malignant neoplasm of hepatic flexure: Secondary | ICD-10-CM | POA: Diagnosis not present

## 2024-08-19 NOTE — Progress Notes (Signed)
 " [facility]   [phone] [phone]   Clinic Follow up Note   Patient Care Team: Theotis Haze ORN, NP as PCP - General (Nurse Practitioner) Lonni Slain, MD as PCP - Cardiology (Cardiology) 08/19/2024  I connected with Leah Hernandez on 08/19/24 at  7:45 AM EST by telephone and verified that I am speaking with the correct person using two identifiers.   I discussed the limitations, risks, security and privacy concerns of performing an evaluation and management service by telephone and the availability of in person appointments. I also discussed with the patient that there may be a patient responsible charge related to this service. The patient expressed understanding and agreed to proceed.   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  -EGD/Colonoscopy by Dr. Suzann 12/14/23 showing gastritis and small hernia as well as a malignant appearing partially obstructing 7 cm tumor in the transverse colon. Path confirmed adenocarcinoma. S/p surgical resection on 12/18/2023. -continue surveillance. CT and GuardantReveal in early Dec 2025 were negative.  - CT in December 2025 showed a 5.0 cm left ovarian cyst, was further evaluated by pelvic MRI, which showed simple cyst.  Due to the enlarging size, will refer her to GYN.  Assessment & Plan Stage II colon cancer, hepatic flexure Stage II colon cancer, currently without evidence of recurrence. She remains asymptomatic. Recent Signatera circulating tumor DNA was negative and CEA was normal. Blood counts, renal, and hepatic function are well-managed, including chronic kidney disease. No evidence of recurrence on recent  imaging or laboratory studies. - Reviewed recent Signatera and CEA results, both negative/normal. - Reviewed well-managed blood counts, renal, and hepatic function. - Planned surveillance with follow-up every four months. - Ordered repeat imaging for next visit in three months.  Enlarging ovarian cyst, postmenopausal Ovarian cyst has increased from 3.3 cm to 5 cm since May 2025. MRI shows no high suspicion for malignancy, but interval growth and postmenopausal status warrant further evaluation. She reports mild pelvic discomfort, no vaginal bleeding, and some discharge. No gynecologist currently involved. - Referred to gynecology at cancer center for further evaluation and management. - Planned tumor marker blood testing prior to gynecology visit to assist with evaluation. - Coordinated laboratory testing to be completed before gynecology appointment.  Plan - Pelvic MRI findings reviewed with patient.  Due to the enlarging size, will refer her to GYN oncology for further evaluation. - Patient will come in later this week for lab including CA125. - Lab and follow-up in 3 months for colon cancer surveillance.   SUMMARY OF ONCOLOGIC HISTORY: Oncology History  Primary cancer of hepatic flexure of colon (HCC)  12/14/2023 Initial Diagnosis   Primary cancer of hepatic flexure of colon (HCC)   12/26/2023 Cancer Staging   Staging form: Colon and Rectum, AJCC 8th Edition - Pathologic: Stage IIA (pT3, pN0, cM0) - Signed by Burton, Lacie K, NP on 12/26/2023 Stage prefix: Initial diagnosis Total positive nodes: 0 Histologic grading system: 4 grade system Histologic grade (G): G2     Discussed the use of AI scribe software for clinical note transcription with the patient, who gave verbal consent to proceed.  History of Present Illness Leah Hernandez is a 64 year old female with stage  II colon cancer, status post resection, who presents for review of recent pelvic MRI findings and ongoing  cancer surveillance.  She remains asymptomatic from a gastrointestinal standpoint with no new systemic symptoms since her last visit. A Signatera test in December 2025 was negative for circulating tumor DNA, and CEA is normal. Blood counts and liver function are stable. Chronic kidney disease is present but unchanged.  CT on July 17, 2024 showed an enlarging ovarian cyst, now 5 cm from 3.3 cm in May 2025. Pelvic MRI confirmed a 5 cm cyst. She is postmenopausal and reports mild pelvic discomfort and a small amount of vaginal discharge without significant pain or vaginal bleeding.  She has no additional cancer-related concerns but feels occasional mild frustration. She does not currently have a gynecologist, which may affect follow-up of the ovarian cyst.     REVIEW OF SYSTEMS:   Constitutional: Denies fevers, chills or abnormal weight loss Eyes: Denies blurriness of vision Ears, nose, mouth, throat, and face: Denies mucositis or sore throat Respiratory: Denies cough, dyspnea or wheezes Cardiovascular: Denies palpitation, chest discomfort or lower extremity swelling Gastrointestinal:  Denies nausea, heartburn or change in bowel habits Skin: Denies abnormal skin rashes Lymphatics: Denies new lymphadenopathy or easy bruising Neurological:Denies numbness, tingling or new weaknesses Behavioral/Psych: Mood is stable, no new changes  All other systems were reviewed with the patient and are negative.  MEDICAL HISTORY:  Past Medical History:  Diagnosis Date   Prediabetes     SURGICAL HISTORY: Past Surgical History:  Procedure Laterality Date   BIOPSY OF SKIN SUBCUTANEOUS TISSUE AND/OR MUCOUS MEMBRANE  12/14/2023   Procedure: BIOPSY, SKIN, SUBCUTANEOUS TISSUE, OR MUCOUS MEMBRANE;  Surgeon: Suzann Inocente HERO, MD;  Location: [facility] ENDOSCOPY;  Service: Gastroenterology;;   COLONOSCOPY N/A 12/14/2023   Procedure: COLONOSCOPY;  Surgeon: Suzann Inocente HERO, MD;  Location: [facility] ENDOSCOPY;  Service:  Gastroenterology;  Laterality: N/A;   ESOPHAGOGASTRODUODENOSCOPY N/A 12/14/2023   Procedure: EGD (ESOPHAGOGASTRODUODENOSCOPY);  Surgeon: Suzann Inocente HERO, MD;  Location: [facility] ENDOSCOPY;  Service: Gastroenterology;  Laterality: N/A;   LAPAROSCOPIC PARTIAL COLECTOMY Right 12/18/2023   Procedure: LAPAROSCOPIC assisted  COLECTOMY;  Surgeon: Polly Cordella LABOR, MD;  Location: [facility] OR;  Service: General;  Laterality: Right;   POLYPECTOMY  12/14/2023   Procedure: POLYPECTOMY, INTESTINE;  Surgeon: Suzann Inocente HERO, MD;  Location: [facility] ENDOSCOPY;  Service: Gastroenterology;;   RIGHT/LEFT HEART CATH AND CORONARY ANGIOGRAPHY N/A 12/17/2023   Procedure: RIGHT/LEFT HEART CATH AND CORONARY ANGIOGRAPHY;  Surgeon: Mady Bruckner, MD;  Location: [facility] INVASIVE CV LAB;  Service: Cardiovascular;  Laterality: N/A;   SCLEROTHERAPY  12/14/2023   Procedure: SCLEROTHERAPY;  Surgeon: Suzann Inocente HERO, MD;  Location: [facility] ENDOSCOPY;  Service: Gastroenterology;;   TUBAL LIGATION      I have reviewed the social history and family history with the patient and they are unchanged from previous note.  ALLERGIES:  has no known allergies.  MEDICATIONS:  Current Outpatient Medications  Medication Sig Dispense Refill   acetaminophen  (TYLENOL ) 325 MG tablet Take 325-650 mg by mouth every 6 (six) hours as needed for mild pain (pain score 1-3) or moderate pain (pain score 4-6).     FARXIGA  10 MG TABS tablet Take 1 tablet (10 mg total) by mouth daily. 90 tablet 1   furosemide  (LASIX ) 40 MG tablet Take 1 tablet (40 mg total) by mouth daily as needed for fluid or edema (Weight gain >3 lbs in a day or >5 lbs in one week). 30 tablet 5   methocarbamol  (ROBAXIN )  500 MG tablet Take 500 mg by mouth 3 (three) times daily.     metoprolol  succinate (TOPROL -XL) 100 MG 24 hr tablet Take 1.5 tablets (150 mg total) by mouth daily. 90 tablet 5   polyethylene glycol powder (GLYCOLAX /MIRALAX ) 17 GM/SCOOP powder Take 17 g by mouth daily. 238 g 0    sacubitril -valsartan  (ENTRESTO ) 24-26 MG Take 1 tablet by mouth 2 (two) times daily. 60 tablet 2   spironolactone  (ALDACTONE ) 25 MG tablet Take 1 tablet (25 mg total) by mouth daily. 30 tablet 5   No current facility-administered medications for this visit.    PHYSICAL EXAMINATION: Not performed   LABORATORY DATA:  I have reviewed the data as listed    Latest Ref Rng & Units 06/30/2024   10:04 AM 04/18/2024   10:07 AM 01/02/2024    9:38 AM  CBC  WBC 4.0 - 10.5 K/uL 4.6  3.4  3.9   Hemoglobin 12.0 - 15.0 g/dL 88.2  86.2  9.3   Hematocrit 36.0 - 46.0 % 36.2  40.9  30.1   Platelets 150 - 400 K/uL 202  208  339         Latest Ref Rng & Units 06/30/2024   10:04 AM 06/17/2024   10:17 AM 06/02/2024    1:45 PM  CMP  Glucose 70 - 99 mg/dL 88  80  882   BUN 8 - 23 mg/dL 24  40  43   Creatinine 0.44 - 1.00 mg/dL 8.63  8.27  8.17   Sodium 135 - 145 mmol/L 138  138  137   Potassium 3.5 - 5.1 mmol/L 4.9  5.2  5.2   Chloride 98 - 111 mmol/L 107  103  102   CO2 22 - 32 mmol/L 23  25  24    Calcium 8.9 - 10.3 mg/dL 9.7  9.7  9.3   Total Protein 6.5 - 8.1 g/dL 7.5     Total Bilirubin 0.0 - 1.2 mg/dL 0.6     Alkaline Phos 38 - 126 U/L 69     AST 15 - 41 U/L 21     ALT 0 - 44 U/L 16         RADIOGRAPHIC STUDIES: I have personally reviewed the radiological images as listed and agreed with the findings in the report. No results found.     I discussed the assessment and treatment plan with the patient. The patient was provided an opportunity to ask questions and all were answered. The patient agreed with the plan and demonstrated an understanding of the instructions.   The patient was advised to call back or seek an in-person evaluation if the symptoms worsen or if the condition fails to improve as anticipated.  I provided 15 minutes of non face-to-face telephone visit time during this encounter, including review of chart and various tests results, discussions about plan of care and  coordination of care plan.    Onita Mattock, MD 08/19/24     "

## 2024-08-19 NOTE — Telephone Encounter (Signed)
 Spoke with the patient regarding the referral to GYN oncology. Patient scheduled as new patient with Dr Eldonna on 2/2 at 9:45 am Patient given an arrival time of 9:30 am.  Explained to the patient the the doctor will perform a pelvic exam at this visit. Patient given the policy that only one visitor allowed and that visitor must be over 16 yrs are allowed in the Cancer Center. Patient given the address/phone number for the clinic and that the center offers free valet service. Patient aware that masks optional.

## 2024-08-19 NOTE — Progress Notes (Signed)
 Sent staff message to Dr Comer Dollar and Rosaline Gunner stating that Dr Lanny placed a referral to Gyn Onc Enlarging left ovarian cyst, rule out malignancy.

## 2024-08-20 ENCOUNTER — Encounter (HOSPITAL_COMMUNITY)
Admission: RE | Admit: 2024-08-20 | Discharge: 2024-08-20 | Disposition: A | Source: Ambulatory Visit | Attending: Cardiology | Admitting: Cardiology

## 2024-08-20 VITALS — BP 98/62 | HR 88 | Ht 66.0 in | Wt 220.7 lb

## 2024-08-20 DIAGNOSIS — I5022 Chronic systolic (congestive) heart failure: Secondary | ICD-10-CM

## 2024-08-20 NOTE — Telephone Encounter (Signed)
Pt aaware

## 2024-08-20 NOTE — Progress Notes (Signed)
 Discharge Progress Report  Patient Details  Name: Leah Hernandez MRN: [id number] Date of Birth: November 10, 1960 Referring Provider:   Flowsheet Row CARDIAC REHAB PHASE II ORIENTATION from 05/15/2024 in [facility]  Referring Provider [name], MD     Number of Visits: 30  Reason for Discharge:  Patient reached a stable level of exercise. Patient independent in their exercise. Patient has met program and personal goals.  Smoking History:  Tobacco Use History[1]  Diagnosis:  Heart failure, chronic systolic (HCC)  ADL UCSD:   Initial Exercise Prescription:  Initial Exercise Prescription - 05/15/24 1500       Date of Initial Exercise RX and Referring Provider   Date 05/15/24    Referring Provider [name], MD    Expected Discharge Date 08/06/24      NuStep   Level 1    SPM 70    Minutes 15    METs 1.8      Track   Laps 10    Minutes 15    METs 2.5      Prescription Details   Frequency (times per week) 3    Duration Progress to 30 minutes of continuous aerobic without signs/symptoms of physical distress      Intensity   THRR 40-80% of Max Heartrate 63-126    Ratings of Perceived Exertion 11-13    Perceived Dyspnea 0-4      Progression   Progression Continue progressive overload as per policy without signs/symptoms or physical distress.      Resistance Training   Training Prescription Yes    Reps 10-15          Discharge Exercise Prescription (Final Exercise Prescription Changes):  Exercise Prescription Changes - 08/20/24 1021       Response to Exercise   Blood Pressure (Admit) 98/62    Blood Pressure (Exit) 119/73    Heart Rate (Admit) 88 bpm    Heart Rate (Exercise) 110 bpm    Heart Rate (Exit) 78 bpm    Rating of Perceived Exertion (Exercise) 11    Symptoms None    Comments Bsrbara completed the cardiac rehab program today.    Duration Continue with 30 min of aerobic  exercise without signs/symptoms of physical distress.    Intensity THRR unchanged      Progression   Progression Continue to progress workloads to maintain intensity without signs/symptoms of physical distress.    Average METs 3      Resistance Training   Training Prescription No    Weight Relaxation day. No weights.      Interval Training   Interval Training No      NuStep   Level 4    SPM 86    Minutes 15    METs 2.6      Track   Laps 19    Minutes 15    METs 3.43      Home Exercise Plan   Plans to continue exercise at Home (comment)   Walking   Frequency Add 3 additional days to program exercise sessions.    Initial Home Exercises Provided 06/18/24          Functional Capacity:  6 Minute Walk     Row Name 05/15/24 1159 08/15/24 1032       6 Minute Walk   Phase Initial Discharge    Distance 1010 feet 1554 feet    Distance % Change -- 53.86 %  Distance Feet Change -- 544 ft    Walk Time 6 minutes 6 minutes    # of Rest Breaks 0 0    MPH 1.91 2.94    METS 2.22 3.07    RPE 11 9    Perceived Dyspnea  0 0    VO2 Peak 7.8 10.76    Symptoms No No    Resting HR 73 bpm 81 bpm    Resting BP 104/62 98/62    Resting Oxygen Saturation  99 % --    Exercise Oxygen Saturation  during 6 min walk 99 % 99 %    Max Ex. HR 88 bpm 88 bpm    Max Ex. BP 122/80 120/62    2 Minute Post BP 110/70 --       Psychological, QOL, Others - Outcomes: PHQ 2/9:    08/11/2024    5:10 PM 07/07/2024    9:52 AM 05/15/2024    3:09 PM 12/26/2023   10:51 AM 09/29/2019    1:57 PM  Depression screen PHQ 2/9  Decreased Interest 0 0 0 0 0  Down, Depressed, Hopeless 0 0 0 0 0  PHQ - 2 Score 0 0 0 0 0  Altered sleeping 0  0    Tired, decreased energy 1  0    Change in appetite 1  0    Feeling bad or failure about yourself  0  0    Trouble concentrating 0  0    Moving slowly or fidgety/restless 0  0    Suicidal thoughts 0  0    PHQ-9 Score 2  0     Difficult doing work/chores  Somewhat difficult         Data saved with a previous flowsheet row definition    Quality of Life:  Quality of Life - 08/11/24 1713       Quality of Life   Select Quality of Life      Quality of Life Scores   Health/Function Pre 26 %    Health/Function Post 23.6 %    Health/Function % Change -9.23 %    Socioeconomic Pre 25.06 %    Socioeconomic Post 21.75 %    Socioeconomic % Change  -13.21 %    Psych/Spiritual Pre 29.58 %    Psych/Spiritual Post 24.86 %    Psych/Spiritual % Change -15.96 %    Family Pre 28.8 %    Family Post 24 %    Family % Change -16.67 %    GLOBAL Pre 27.09 %    GLOBAL Post 23.49 %    GLOBAL % Change -13.29 %          Personal Goals: Goals established at orientation with interventions provided to work toward goal.  Personal Goals and Risk Factors at Admission - 05/15/24 1547       Core Components/Risk Factors/Patient Goals on Admission    Weight Management Yes;Weight Loss    Intervention Weight Management: Develop a combined nutrition and exercise program designed to reach desired caloric intake, while maintaining appropriate intake of nutrient and fiber, sodium and fats, and appropriate energy expenditure required for the weight goal.;Weight Management: Provide education and appropriate resources to help participant work on and attain dietary goals.    Expected Outcomes Short Term: Continue to assess and modify interventions until short term weight is achieved;Long Term: Adherence to nutrition and physical activity/exercise program aimed toward attainment of established weight goal;Weight Loss: Understanding of general recommendations for a balanced  deficit meal plan, which promotes 1-2 lb weight loss per week and includes a negative energy balance of [phone] kcal/d;Understanding recommendations for meals to include 15-35% energy as protein, 25-35% energy from fat, 35-60% energy from carbohydrates, less than 200mg  of dietary cholesterol, 20-35 gm of  total fiber daily;Understanding of distribution of calorie intake throughout the day with the consumption of 4-5 meals/snacks    Heart Failure Yes    Intervention Provide a combined exercise and nutrition program that is supplemented with education, support and counseling about heart failure. Directed toward relieving symptoms such as shortness of breath, decreased exercise tolerance, and extremity edema.    Expected Outcomes Improve functional capacity of life;Short term: Attendance in program 2-3 days a week with increased exercise capacity. Reported lower sodium intake. Reported increased fruit and vegetable intake. Reports medication compliance.;Short term: Daily weights obtained and reported for increase. Utilizing diuretic protocols set by physician.;Long term: Adoption of self-care skills and reduction of barriers for early signs and symptoms recognition and intervention leading to self-care maintenance.    Hypertension Yes    Intervention Provide education on lifestyle modifcations including regular physical activity/exercise, weight management, moderate sodium restriction and increased consumption of fresh fruit, vegetables, and low fat dairy, alcohol moderation, and smoking cessation.;Monitor prescription use compliance.    Expected Outcomes Short Term: Continued assessment and intervention until BP is < 140/42mm HG in hypertensive participants. < 130/61mm HG in hypertensive participants with diabetes, heart failure or chronic kidney disease.;Long Term: Maintenance of blood pressure at goal levels.    Lipids Yes    Intervention Provide education and support for participant on nutrition & aerobic/resistive exercise along with prescribed medications to achieve LDL 70mg , HDL >40mg .    Expected Outcomes Short Term: Participant states understanding of desired cholesterol values and is compliant with medications prescribed. Participant is following exercise prescription and nutrition guidelines.;Long  Term: Cholesterol controlled with medications as prescribed, with individualized exercise RX and with personalized nutrition plan. Value goals: LDL < 70mg , HDL > 40 mg.    Personal Goal Other Yes    Personal Goal ST and long term: stabilize weight (lower fluid), eat healthier, increase flexibility    Intervention Will continue to monitor pt and progress workloads as tolerated without sign or symptom    Expected Outcomes Pt will achieve her goals           Personal Goals Discharge:  Goals and Risk Factor Review     Row Name 05/21/24 1637 06/02/24 1803 07/01/24 1619 07/29/24 1409       Core Components/Risk Factors/Patient Goals Review   Personal Goals Review Weight Management/Obesity;Heart Failure;Hypertension;Lipids Weight Management/Obesity;Heart Failure;Hypertension;Lipids Weight Management/Obesity;Heart Failure;Hypertension;Lipids Weight Management/Obesity;Heart Failure;Hypertension;Lipids    Review Jonnae started cardiac rehab on 05/21/24. Artavia did well with excise. Vital signs were stable. Spot checked CBG's WNL. Lyberti does not check her CBG's at home. Henleigh started cardiac rehab on 05/21/24. Oyinkansola is off to a good start with excise. Vital signs have been  stable. Derionna's weight is up 3.2 kg since starting the program. the pharmacist at the heart failure clinic is aware of weight gain. Medications have been adjusted. Cynde is doing well with exercise at  cardiac rehab when in attendance.. Vital signs have been stable. Neola's weight is up 5.6  kg since starting the program. Adeena is doing well with exercise at  cardiac rehab when in attendance.. Vital signs remain stable. Dortha's weight is up 4.1 kg since starting the program. Ermina will tenatively complete cardiac rehab on 08/26/23.  Expected Outcomes Levina will continue to paritcipate on cardiac rehab for exercise, nutrition and lifestyle modifications Grazia will continue to paritcipate on cardiac rehab for  exercise, nutrition and lifestyle modifications Geselle will continue to paritcipate on cardiac rehab for exercise, nutrition and lifestyle modifications Wilma will continue to paritcipate on cardiac rehab for exercise, nutrition and lifestyle modifications       Exercise Goals and Review:  Exercise Goals     Row Name 05/15/24 1521             Exercise Goals   Increase Physical Activity Yes       Intervention Provide advice, education, support and counseling about physical activity/exercise needs.;Develop an individualized exercise prescription for aerobic and resistive training based on initial evaluation findings, risk stratification, comorbidities and participant's personal goals.       Expected Outcomes Short Term: Attend rehab on a regular basis to increase amount of physical activity.;Long Term: Exercising regularly at least 3-5 days a week.;Long Term: Add in home exercise to make exercise part of routine and to increase amount of physical activity.       Increase Strength and Stamina Yes       Intervention Provide advice, education, support and counseling about physical activity/exercise needs.;Develop an individualized exercise prescription for aerobic and resistive training based on initial evaluation findings, risk stratification, comorbidities and participant's personal goals.       Expected Outcomes Short Term: Increase workloads from initial exercise prescription for resistance, speed, and METs.;Short Term: Perform resistance training exercises routinely during rehab and add in resistance training at home;Long Term: Improve cardiorespiratory fitness, muscular endurance and strength as measured by increased METs and functional capacity ( )       Able to understand and use rate of perceived exertion (RPE) scale Yes       Intervention Provide education and explanation on how to use RPE scale       Expected Outcomes Short Term: Able to use RPE daily in rehab to express subjective  intensity level;Long Term:  Able to use RPE to guide intensity level when exercising independently       Knowledge and understanding of Target Heart Rate Range (THRR) Yes       Intervention Provide education and explanation of THRR including how the numbers were predicted and where they are located for reference       Expected Outcomes Short Term: Able to state/look up THRR;Long Term: Able to use THRR to govern intensity when exercising independently;Short Term: Able to use daily as guideline for intensity in rehab       Understanding of Exercise Prescription Yes       Intervention Provide education, explanation, and written materials on patient's individual exercise prescription       Expected Outcomes Short Term: Able to explain program exercise prescription;Long Term: Able to explain home exercise prescription to exercise independently          Exercise Goals Re-Evaluation:  Exercise Goals Re-Evaluation     Row Name 05/21/24 1128 06/18/24 1108 07/09/24 1106 08/06/24 1139 08/15/24 1045     Exercise Goal Re-Evaluation   Exercise Goals Review Increase Physical Activity;Increase Strength and Stamina;Able to understand and use rate of perceived exertion (RPE) scale Increase Physical Activity;Increase Strength and Stamina;Able to understand and use rate of perceived exertion (RPE) scale;Understanding of Exercise Prescription;Knowledge and understanding of Target Heart Rate Range (THRR) Increase Physical Activity;Increase Strength and Stamina;Able to understand and use rate of perceived exertion (RPE) scale;Understanding of Exercise Prescription;Knowledge  and understanding of Target Heart Rate Range (THRR) Increase Physical Activity;Increase Strength and Stamina;Able to understand and use rate of perceived exertion (RPE) scale;Understanding of Exercise Prescription;Knowledge and understanding of Target Heart Rate Range (THRR) Increase Physical Activity;Increase Strength and Stamina;Able to understand and  use rate of perceived exertion (RPE) scale;Understanding of Exercise Prescription;Knowledge and understanding of Target Heart Rate Range (THRR)   Comments Saquoia is able to understand and use RPE scale appropriately. Reviewed exercise prescription with [name]. She is walking 3 days/week. Her goal is to stay healthy and lose weight. Will increase hand weights from 2 to 3 lbs next session Sowmya feels like she's where she needs to be with exercise. Increased workload on the recumbent stepper today. Harmoni is scheduled to graduate in the coming weeks. Discussed Renna's post-cardiac rehab exercise plan. She plans to walk at least 3 days/week in her neighborhood and at the park. Collyns will complete the cardiac rehab program this week. She has made steady progress with exercise. Her functional capacity increased 54% as measured by and her balance improved 8.93 seconds. She plans to walk 30-60 minutes at least 3 times /week. She has small hand weights (she think they're 3 lbs) that she will use for her resistance training 2-3 times per week.   Expected Outcomes Progress workloads as tolerated to help improve cardiorespiratory fitness. Sanoe will walk 20-30 minutes 3 days/week to help achieve health and fitness goals. Continue to progress workloads as tolerated. Thu will continue walking to maintain health and fitness gains. Marilee will continue walking to maintain health and fitness gains.    Row Name 08/20/24 1126             Exercise Goal Re-Evaluation   Exercise Goals Review Increase Physical Activity;Increase Strength and Stamina;Able to understand and use rate of perceived exertion (RPE) scale;Understanding of Exercise Prescription;Knowledge and understanding of Target Heart Rate Range (THRR)       Comments Dajai completed the cardiac rehab program today ans will continue walking at home and in the park as her mode of exercise.       Expected Outcomes Quintasha will continue walking to  maintain health and fitness gains.          Nutrition & Weight - Outcomes:  Pre Biometrics - 05/15/24 1100       Pre Biometrics   Waist Circumference 41 inches    Hip Circumference 50 inches    Waist to Hip Ratio 0.82 %    Triceps Skinfold 31 mm    % Body Fat 44.2 %    Grip Strength 32 kg    Flexibility 16 in    Single Leg Stand 10 seconds          Post Biometrics - 08/20/24 1002        Post  Biometrics   Height 5' 6 (1.676 m)    Waist Circumference 44 inches    Hip Circumference 51.5 inches    Waist to Hip Ratio 0.85 %    BMI (Calculated) 35.64    Triceps Skinfold 36 mm    % Body Fat 47 %    Grip Strength 27 kg    Flexibility 14.88 in    Single Leg Stand 18.93 seconds          Nutrition:  Nutrition Therapy & Goals - 07/04/24 1511       Nutrition Therapy   Diet Heart Healthy      Personal Nutrition Goals   Nutrition Goal Patient to  improve diet quality by using the plate method as a guide for meal planning to include lean protein/plant protein, fruits, vegetables, whole grains, nonfat dairy as part of a well-balanced diet.    Personal Goal #2 Patient to limit sodium intake to <2300 mg per day.    Personal Goal #3 Patient to identify strategies for weight loss with goal of 0.5-2 # per week of weight loss.    Comments Patient with past medical history significant for chronic systolic heart failure, colon cancer s/p hemicolectomy, chronic kidney disease, and prediabetes. Nutritionally pertinent labs on 06/30/24 include K 4.9, BUN 24, Cr 1.36, eGFR 44. Most recent A1c on 05/14/24 6.2%. Recent wt gain of 1.6 kg (1.7%) noted over past month. Pt attributes recent wt gain to snacks which include chips and popcorn. Reports avoiding fried foods; typically has baked chicken at meals. RD provided healthy snacks ideas including lower caloric foods such as fruit/veggies. Encouraged pt to opt for lower potassium fruit/veggies given borderline hyperkalemia. Patient will benefit  from participation in intensive cardiac rehab for nutrition education, exercise, and lifestyle modification.      Intervention Plan   Intervention Prescribe, educate and counsel regarding individualized specific dietary modifications aiming towards targeted core components such as weight, hypertension, lipid management, diabetes, heart failure and other comorbidities.;Nutrition handout(s) given to patient.   Handouts: Heart Failure Nutrition Therapy, Potassium Content of Foods   Expected Outcomes Short Term Goal: Understand basic principles of dietary content, such as calories, fat, sodium, cholesterol and nutrients.;Long Term Goal: Adherence to prescribed nutrition plan.          Nutrition Discharge:   Education Questionnaire Score:  Knowledge Questionnaire Score - 08/11/24 1713       Knowledge Questionnaire Score   Pre Score 21/24    Post Score 23/24          Goals reviewed with patient; copy given to patient. Alethea graduated from  Intensive/Traditional cardiac rehab program on 08/20/24 with completion of  24 exercise and  6 education sessions. Pt maintained good attendance and progressed nicely during their participation in rehab as evidenced by increased MET level. Julia increased her distance on her post exercise walk test by 544 feet.  Medication list reconciled. Repeat  PHQ score- 2 .  Pt has made significant lifestyle changes and should be commended for her success. Elyse achieved her goals during cardiac rehab.   Pt plans to continue exercise at  home walking 3 days a week as the temperature permits. We are proud of Stellar's progress! Hadassah Elpidio Quan RN BSN      [1]  Social History Tobacco Use  Smoking Status Never  Smokeless Tobacco Never

## 2024-08-24 ENCOUNTER — Other Ambulatory Visit (HOSPITAL_COMMUNITY): Payer: Self-pay

## 2024-08-24 ENCOUNTER — Other Ambulatory Visit (HOSPITAL_COMMUNITY): Payer: Self-pay | Admitting: Cardiology

## 2024-08-25 ENCOUNTER — Other Ambulatory Visit: Payer: Self-pay

## 2024-08-25 ENCOUNTER — Other Ambulatory Visit (HOSPITAL_COMMUNITY): Payer: Self-pay

## 2024-08-25 MED ORDER — SACUBITRIL-VALSARTAN 24-26 MG PO TABS
1.0000 | ORAL_TABLET | Freq: Two times a day (BID) | ORAL | 2 refills | Status: AC
  Filled 2024-08-25: qty 60, 30d supply, fill #0

## 2024-08-27 ENCOUNTER — Encounter: Payer: Self-pay | Admitting: Psychiatry

## 2024-08-27 ENCOUNTER — Inpatient Hospital Stay

## 2024-08-27 DIAGNOSIS — C183 Malignant neoplasm of hepatic flexure: Secondary | ICD-10-CM

## 2024-08-27 LAB — CBC WITH DIFFERENTIAL (CANCER CENTER ONLY)
Abs Immature Granulocytes: 0.01 10*3/uL (ref 0.00–0.07)
Basophils Absolute: 0 10*3/uL (ref 0.0–0.1)
Basophils Relative: 1 %
Eosinophils Absolute: 0 10*3/uL (ref 0.0–0.5)
Eosinophils Relative: 1 %
HCT: 39.4 % (ref 36.0–46.0)
Hemoglobin: 13 g/dL (ref 12.0–15.0)
Immature Granulocytes: 0 %
Lymphocytes Relative: 31 %
Lymphs Abs: 1.3 10*3/uL (ref 0.7–4.0)
MCH: 31.3 pg (ref 26.0–34.0)
MCHC: 33 g/dL (ref 30.0–36.0)
MCV: 94.7 fL (ref 80.0–100.0)
Monocytes Absolute: 0.4 10*3/uL (ref 0.1–1.0)
Monocytes Relative: 9 %
Neutro Abs: 2.5 10*3/uL (ref 1.7–7.7)
Neutrophils Relative %: 58 %
Platelet Count: 177 10*3/uL (ref 150–400)
RBC: 4.16 MIL/uL (ref 3.87–5.11)
RDW: 12.5 % (ref 11.5–15.5)
WBC Count: 4.2 10*3/uL (ref 4.0–10.5)
nRBC: 0 % (ref 0.0–0.2)

## 2024-08-27 LAB — CMP (CANCER CENTER ONLY)
ALT: 49 U/L — ABNORMAL HIGH (ref 0–44)
AST: 63 U/L — ABNORMAL HIGH (ref 15–41)
Albumin: 4.5 g/dL (ref 3.5–5.0)
Alkaline Phosphatase: 101 U/L (ref 38–126)
Anion gap: 12 (ref 5–15)
BUN: 34 mg/dL — ABNORMAL HIGH (ref 8–23)
CO2: 21 mmol/L — ABNORMAL LOW (ref 22–32)
Calcium: 9.9 mg/dL (ref 8.9–10.3)
Chloride: 101 mmol/L (ref 98–111)
Creatinine: 1.75 mg/dL — ABNORMAL HIGH (ref 0.44–1.00)
GFR, Estimated: 32 mL/min — ABNORMAL LOW
Glucose, Bld: 96 mg/dL (ref 70–99)
Potassium: 5.2 mmol/L — ABNORMAL HIGH (ref 3.5–5.1)
Sodium: 134 mmol/L — ABNORMAL LOW (ref 135–145)
Total Bilirubin: 0.9 mg/dL (ref 0.0–1.2)
Total Protein: 8.1 g/dL (ref 6.5–8.1)

## 2024-08-27 LAB — IRON AND IRON BINDING CAPACITY (CC-WL,HP ONLY)
Iron: 64 ug/dL (ref 28–170)
Saturation Ratios: 18 % (ref 10.4–31.8)
TIBC: 351 ug/dL (ref 250–450)
UIBC: 288 ug/dL

## 2024-08-27 LAB — CEA (ACCESS): CEA (CHCC): 1.17 ng/mL (ref 0.00–5.00)

## 2024-08-27 LAB — FERRITIN: Ferritin: 310 ng/mL — ABNORMAL HIGH (ref 11–307)

## 2024-08-28 ENCOUNTER — Other Ambulatory Visit: Payer: Self-pay | Admitting: *Deleted

## 2024-08-28 DIAGNOSIS — N83202 Unspecified ovarian cyst, left side: Secondary | ICD-10-CM

## 2024-08-29 ENCOUNTER — Telehealth: Payer: Self-pay | Admitting: *Deleted

## 2024-08-29 LAB — CA 125: Cancer Antigen (CA) 125: 5.1 U/mL (ref 0.0–38.1)

## 2024-08-29 NOTE — Telephone Encounter (Signed)
 Moved appt from 2/2 to 2/3 due to weather

## 2024-09-01 ENCOUNTER — Inpatient Hospital Stay: Admitting: Psychiatry

## 2024-09-01 DIAGNOSIS — N83202 Unspecified ovarian cyst, left side: Secondary | ICD-10-CM

## 2024-09-02 ENCOUNTER — Inpatient Hospital Stay: Admitting: Psychiatry

## 2024-09-02 DIAGNOSIS — N83202 Unspecified ovarian cyst, left side: Secondary | ICD-10-CM

## 2024-09-02 NOTE — Progress Notes (Signed)
 This encounter was created in error - please disregard.

## 2024-09-03 ENCOUNTER — Telehealth: Payer: Self-pay | Admitting: *Deleted

## 2024-09-03 NOTE — Telephone Encounter (Signed)
 Spoke with the patient and rescheduled the missed appt from 2/2 due to the weather

## 2024-09-04 ENCOUNTER — Telehealth: Payer: Self-pay

## 2024-09-04 ENCOUNTER — Other Ambulatory Visit: Payer: Self-pay

## 2024-09-04 NOTE — Telephone Encounter (Signed)
 LVM for pt to return call to Dr Demetra office to schedule an appt for lab draw for Guardant Reveal.  Provided pt with a return telephone number to callback.  Awaiting pt's return call.

## 2024-09-12 ENCOUNTER — Inpatient Hospital Stay: Admitting: Gynecologic Oncology

## 2024-09-22 ENCOUNTER — Inpatient Hospital Stay: Attending: Nurse Practitioner | Admitting: Psychiatry

## 2024-11-06 ENCOUNTER — Inpatient Hospital Stay: Admitting: Hematology

## 2024-11-06 ENCOUNTER — Inpatient Hospital Stay

## 2024-11-26 ENCOUNTER — Inpatient Hospital Stay
